# Patient Record
Sex: Male | Born: 1940 | Race: White | Hispanic: No | Marital: Married | Smoking: Former smoker
Health system: Southern US, Community
[De-identification: ages and names within clinical notes are randomized; demographics above are authoritative.]

## PROBLEM LIST (undated history)

## (undated) DIAGNOSIS — K219 Gastro-esophageal reflux disease without esophagitis: Secondary | ICD-10-CM

## (undated) DIAGNOSIS — Z8719 Personal history of other diseases of the digestive system: Secondary | ICD-10-CM

## (undated) DIAGNOSIS — I10 Essential (primary) hypertension: Secondary | ICD-10-CM

## (undated) DIAGNOSIS — T4145XA Adverse effect of unspecified anesthetic, initial encounter: Secondary | ICD-10-CM

## (undated) DIAGNOSIS — J301 Allergic rhinitis due to pollen: Secondary | ICD-10-CM

## (undated) DIAGNOSIS — N182 Chronic kidney disease, stage 2 (mild): Secondary | ICD-10-CM

## (undated) DIAGNOSIS — J302 Other seasonal allergic rhinitis: Secondary | ICD-10-CM

## (undated) DIAGNOSIS — E785 Hyperlipidemia, unspecified: Secondary | ICD-10-CM

## (undated) DIAGNOSIS — I4891 Unspecified atrial fibrillation: Secondary | ICD-10-CM

## (undated) DIAGNOSIS — T8859XA Other complications of anesthesia, initial encounter: Secondary | ICD-10-CM

## (undated) DIAGNOSIS — M545 Low back pain, unspecified: Secondary | ICD-10-CM

## (undated) DIAGNOSIS — Z9889 Other specified postprocedural states: Secondary | ICD-10-CM

## (undated) DIAGNOSIS — Z87442 Personal history of urinary calculi: Secondary | ICD-10-CM

## (undated) DIAGNOSIS — J90 Pleural effusion, not elsewhere classified: Secondary | ICD-10-CM

## (undated) DIAGNOSIS — E669 Obesity, unspecified: Secondary | ICD-10-CM

## (undated) DIAGNOSIS — G8929 Other chronic pain: Secondary | ICD-10-CM

## (undated) DIAGNOSIS — G4730 Sleep apnea, unspecified: Secondary | ICD-10-CM

## (undated) DIAGNOSIS — I255 Ischemic cardiomyopathy: Secondary | ICD-10-CM

## (undated) DIAGNOSIS — J42 Unspecified chronic bronchitis: Secondary | ICD-10-CM

## (undated) DIAGNOSIS — E1129 Type 2 diabetes mellitus with other diabetic kidney complication: Secondary | ICD-10-CM

## (undated) DIAGNOSIS — Z951 Presence of aortocoronary bypass graft: Secondary | ICD-10-CM

## (undated) DIAGNOSIS — M1990 Unspecified osteoarthritis, unspecified site: Secondary | ICD-10-CM

## (undated) DIAGNOSIS — Z8679 Personal history of other diseases of the circulatory system: Secondary | ICD-10-CM

## (undated) DIAGNOSIS — I219 Acute myocardial infarction, unspecified: Secondary | ICD-10-CM

## (undated) HISTORY — DX: Ischemic cardiomyopathy: I25.5

## (undated) HISTORY — DX: Obesity, unspecified: E66.9

## (undated) HISTORY — DX: Essential (primary) hypertension: I10

## (undated) HISTORY — DX: Pleural effusion, not elsewhere classified: J90

## (undated) HISTORY — PX: CYSTOSCOPY: SUR368

## (undated) HISTORY — PX: OTHER SURGICAL HISTORY: SHX169

## (undated) HISTORY — DX: Acute myocardial infarction, unspecified: I21.9

## (undated) HISTORY — DX: Hyperlipidemia, unspecified: E78.5

## (undated) HISTORY — DX: Unspecified atrial fibrillation: I48.91

## (undated) SURGERY — Surgical Case
Anesthesia: *Unknown

---
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
## 1998-12-01 HISTORY — PX: SINOSCOPY: SHX187

## 2001-06-10 ENCOUNTER — Encounter: Admission: RE | Admit: 2001-06-10 | Discharge: 2001-06-10 | Payer: Self-pay | Admitting: Internal Medicine

## 2001-06-10 ENCOUNTER — Encounter: Payer: Self-pay | Admitting: Internal Medicine

## 2001-11-22 ENCOUNTER — Encounter: Admission: RE | Admit: 2001-11-22 | Discharge: 2001-11-22 | Payer: Self-pay | Admitting: Internal Medicine

## 2001-11-22 ENCOUNTER — Encounter: Payer: Self-pay | Admitting: Internal Medicine

## 2002-06-09 ENCOUNTER — Encounter: Payer: Self-pay | Admitting: Internal Medicine

## 2002-06-09 ENCOUNTER — Ambulatory Visit (HOSPITAL_COMMUNITY): Admission: RE | Admit: 2002-06-09 | Discharge: 2002-06-09 | Payer: Self-pay | Admitting: Internal Medicine

## 2002-12-01 HISTORY — PX: NASAL SEPTUM SURGERY: SHX37

## 2004-07-29 ENCOUNTER — Encounter: Admission: RE | Admit: 2004-07-29 | Discharge: 2004-07-29 | Payer: Self-pay | Admitting: Internal Medicine

## 2005-03-27 ENCOUNTER — Ambulatory Visit: Payer: Self-pay | Admitting: Internal Medicine

## 2005-04-15 ENCOUNTER — Ambulatory Visit (HOSPITAL_BASED_OUTPATIENT_CLINIC_OR_DEPARTMENT_OTHER): Admission: RE | Admit: 2005-04-15 | Discharge: 2005-04-15 | Payer: Self-pay | Admitting: Internal Medicine

## 2005-04-21 ENCOUNTER — Ambulatory Visit: Payer: Self-pay | Admitting: Internal Medicine

## 2005-04-30 ENCOUNTER — Ambulatory Visit: Payer: Self-pay | Admitting: Internal Medicine

## 2005-11-17 ENCOUNTER — Encounter: Admission: RE | Admit: 2005-11-17 | Discharge: 2005-11-17 | Payer: Self-pay | Admitting: Internal Medicine

## 2006-07-03 ENCOUNTER — Ambulatory Visit: Payer: Self-pay | Admitting: Internal Medicine

## 2006-08-19 ENCOUNTER — Emergency Department (HOSPITAL_COMMUNITY): Admission: EM | Admit: 2006-08-19 | Discharge: 2006-08-20 | Payer: Self-pay | Admitting: Emergency Medicine

## 2006-12-01 DIAGNOSIS — I219 Acute myocardial infarction, unspecified: Secondary | ICD-10-CM

## 2006-12-01 HISTORY — DX: Acute myocardial infarction, unspecified: I21.9

## 2006-12-01 HISTORY — PX: CORONARY ANGIOPLASTY WITH STENT PLACEMENT: SHX49

## 2007-01-10 ENCOUNTER — Inpatient Hospital Stay (HOSPITAL_COMMUNITY): Admission: EM | Admit: 2007-01-10 | Discharge: 2007-01-12 | Payer: Self-pay

## 2007-01-10 ENCOUNTER — Ambulatory Visit: Payer: Self-pay | Admitting: Cardiology

## 2007-01-11 ENCOUNTER — Encounter: Payer: Self-pay | Admitting: Cardiology

## 2007-01-18 ENCOUNTER — Ambulatory Visit: Payer: Self-pay | Admitting: Cardiovascular Disease

## 2007-01-22 ENCOUNTER — Encounter (HOSPITAL_COMMUNITY): Admission: RE | Admit: 2007-01-22 | Discharge: 2007-04-22 | Payer: Self-pay | Admitting: Cardiovascular Disease

## 2007-02-01 ENCOUNTER — Ambulatory Visit: Payer: Self-pay | Admitting: Cardiovascular Disease

## 2007-04-05 ENCOUNTER — Ambulatory Visit: Payer: Self-pay | Admitting: Cardiovascular Disease

## 2007-04-05 LAB — CONVERTED CEMR LAB
ALT: 31 units/L (ref 0–40)
AST: 23 units/L (ref 0–37)
Albumin: 3.7 g/dL (ref 3.5–5.2)
Alkaline Phosphatase: 63 units/L (ref 39–117)
Bilirubin, Direct: 0.1 mg/dL (ref 0.0–0.3)
Cholesterol: 94 mg/dL (ref 0–200)
HDL: 30 mg/dL — ABNORMAL LOW (ref >39.0)
Hgb A1c MFr Bld: 5.7 % (ref 4.6–6.0)
LDL Cholesterol: 53 mg/dL (ref 0–99)
Total Bilirubin: 1 mg/dL (ref 0.3–1.2)
Total CHOL/HDL Ratio: 3.1
Total Protein: 6.7 g/dL (ref 6.0–8.3)
Triglycerides: 57 mg/dL (ref 0–149)
VLDL: 11 mg/dL (ref 0–40)

## 2007-04-23 ENCOUNTER — Encounter (HOSPITAL_COMMUNITY): Admission: RE | Admit: 2007-04-23 | Discharge: 2007-04-23 | Payer: Self-pay | Admitting: Cardiovascular Disease

## 2007-05-06 ENCOUNTER — Ambulatory Visit: Payer: Self-pay | Admitting: Cardiovascular Disease

## 2007-05-06 LAB — CONVERTED CEMR LAB
ALT: 40 units/L (ref 0–40)
AST: 30 units/L (ref 0–37)
Albumin: 4 g/dL (ref 3.5–5.2)
Alkaline Phosphatase: 67 units/L (ref 39–117)
BUN: 13 mg/dL (ref 6–23)
Bilirubin, Direct: 0.1 mg/dL (ref 0.0–0.3)
CO2: 25 meq/L (ref 19–32)
Calcium: 9.2 mg/dL (ref 8.4–10.5)
Chloride: 107 meq/L (ref 96–112)
Cholesterol: 99 mg/dL (ref 0–200)
Creatinine, Ser: 1.1 mg/dL (ref 0.4–1.5)
GFR calc Af Amer: 87 mL/min
GFR calc non Af Amer: 72 mL/min
Glucose, Bld: 99 mg/dL (ref 70–99)
HDL: 29.7 mg/dL — ABNORMAL LOW (ref >39.0)
LDL Cholesterol: 57 mg/dL (ref 0–99)
Potassium: 4.5 meq/L (ref 3.5–5.1)
Sodium: 137 meq/L (ref 135–145)
Total Bilirubin: 1.3 mg/dL — ABNORMAL HIGH (ref 0.3–1.2)
Total CHOL/HDL Ratio: 3.3
Total Protein: 6.9 g/dL (ref 6.0–8.3)
Triglycerides: 60 mg/dL (ref 0–149)
VLDL: 12 mg/dL (ref 0–40)

## 2007-05-27 ENCOUNTER — Ambulatory Visit: Payer: Self-pay

## 2008-01-20 ENCOUNTER — Ambulatory Visit: Payer: Self-pay | Admitting: Cardiovascular Disease

## 2008-01-20 LAB — CONVERTED CEMR LAB
ALT: 43 units/L (ref 0–53)
AST: 32 units/L (ref 0–37)
Albumin: 4 g/dL (ref 3.5–5.2)
Alkaline Phosphatase: 66 units/L (ref 39–117)
BUN: 15 mg/dL (ref 6–23)
Bilirubin, Direct: 0.2 mg/dL (ref 0.0–0.3)
CO2: 24 meq/L (ref 19–32)
Calcium: 9.3 mg/dL (ref 8.4–10.5)
Chloride: 104 meq/L (ref 96–112)
Cholesterol: 94 mg/dL (ref 0–200)
Creatinine, Ser: 1.3 mg/dL (ref 0.4–1.5)
GFR calc Af Amer: 71 mL/min
GFR calc non Af Amer: 59 mL/min
Glucose, Bld: 107 mg/dL — ABNORMAL HIGH (ref 70–99)
HDL: 29.8 mg/dL — ABNORMAL LOW (ref >39.0)
LDL Cholesterol: 56 mg/dL (ref 0–99)
Potassium: 4.4 meq/L (ref 3.5–5.1)
Sodium: 136 meq/L (ref 135–145)
Total Bilirubin: 1.3 mg/dL — ABNORMAL HIGH (ref 0.3–1.2)
Total CHOL/HDL Ratio: 3.2
Total Protein: 6.9 g/dL (ref 6.0–8.3)
Triglycerides: 40 mg/dL (ref 0–149)
VLDL: 8 mg/dL (ref 0–40)

## 2008-02-16 ENCOUNTER — Encounter: Admission: RE | Admit: 2008-02-16 | Discharge: 2008-02-16 | Payer: Self-pay | Admitting: Internal Medicine

## 2009-05-16 ENCOUNTER — Encounter: Admission: RE | Admit: 2009-05-16 | Discharge: 2009-05-16 | Payer: Self-pay | Admitting: Sports Medicine

## 2009-12-14 ENCOUNTER — Encounter: Admission: RE | Admit: 2009-12-14 | Discharge: 2009-12-14 | Payer: Self-pay | Admitting: General Surgery

## 2009-12-18 ENCOUNTER — Ambulatory Visit (HOSPITAL_BASED_OUTPATIENT_CLINIC_OR_DEPARTMENT_OTHER): Admission: RE | Admit: 2009-12-18 | Discharge: 2009-12-18 | Payer: Self-pay | Admitting: General Surgery

## 2009-12-18 HISTORY — PX: HERNIA REPAIR: SHX51

## 2009-12-20 ENCOUNTER — Emergency Department (HOSPITAL_BASED_OUTPATIENT_CLINIC_OR_DEPARTMENT_OTHER): Admission: EM | Admit: 2009-12-20 | Discharge: 2009-12-20 | Payer: Self-pay | Admitting: Emergency Medicine

## 2010-01-01 HISTORY — PX: UMBILICAL HERNIA REPAIR: SHX196

## 2011-02-16 LAB — COMPREHENSIVE METABOLIC PANEL
ALT: 48 U/L (ref 0–53)
AST: 35 U/L (ref 0–37)
Albumin: 4.1 g/dL (ref 3.5–5.2)
Alkaline Phosphatase: 50 U/L (ref 39–117)
BUN: 15 mg/dL (ref 6–23)
CO2: 25 mEq/L (ref 19–32)
Calcium: 9.2 mg/dL (ref 8.4–10.5)
Chloride: 105 mEq/L (ref 96–112)
Creatinine, Ser: 1.19 mg/dL (ref 0.4–1.5)
GFR calc Af Amer: >60 mL/min (ref >60)
GFR calc non Af Amer: >60 mL/min (ref >60)
Glucose, Bld: 163 mg/dL — ABNORMAL HIGH (ref 70–99)
Potassium: 4.7 mEq/L (ref 3.5–5.1)
Sodium: 138 mEq/L (ref 135–145)
Total Bilirubin: 1.2 mg/dL (ref 0.3–1.2)
Total Protein: 6.8 g/dL (ref 6.0–8.3)

## 2011-02-16 LAB — DIFFERENTIAL
Basophils Absolute: 0 10*3/uL (ref 0.0–0.1)
Basophils Relative: 0 % (ref 0–1)
Eosinophils Absolute: 0.4 10*3/uL (ref 0.0–0.7)
Eosinophils Relative: 6 % — ABNORMAL HIGH (ref 0–5)
Lymphocytes Relative: 22 % (ref 12–46)
Lymphs Abs: 1.4 10*3/uL (ref 0.7–4.0)
Monocytes Absolute: 0.7 10*3/uL (ref 0.1–1.0)
Monocytes Relative: 11 % (ref 3–12)
Neutro Abs: 3.9 10*3/uL (ref 1.7–7.7)
Neutrophils Relative %: 61 % (ref 43–77)

## 2011-02-16 LAB — CBC
HCT: 52.7 % — ABNORMAL HIGH (ref 39.0–52.0)
Hemoglobin: 17.8 g/dL — ABNORMAL HIGH (ref 13.0–17.0)
MCHC: 33.8 g/dL (ref 30.0–36.0)
MCV: 94.4 fL (ref 78.0–100.0)
Platelets: 163 10*3/uL (ref 150–400)
RBC: 5.58 MIL/uL (ref 4.22–5.81)
RDW: 14 % (ref 11.5–15.5)
WBC: 6.4 10*3/uL (ref 4.0–10.5)

## 2011-02-17 LAB — GLUCOSE, CAPILLARY: Glucose-Capillary: 122 mg/dL — ABNORMAL HIGH (ref 70–99)

## 2011-04-15 NOTE — Assessment & Plan Note (Signed)
[facility]                            CARDIOLOGY OFFICE NOTE   NAME:[name], [name]                    MRN:          [id number]  DATE:05/06/2007                            DOB:          [birth year]    [name] was seen at the [facility] as an  outpatient on May 06, 2007. He is a 70 year old gentleman who had a  inferoposterior myocardial infarction in February of this year. He had  an occluded right coronary artery and was treated with a bare metal  stent to that vessel. He is continuing to participate in cardiac rehab  and has really enjoyed that experience. He plans on volunteering there  when he completes his program. He is exercising regularly and also doing  fairly hard physical work. He does complain of some generalized fatigue  that he relates to his long acting metoprolol. He worries about his  heart rate which has been in the range of 50 and sometimes a little bit  lower. Otherwise he feels well. He denies any chest pain, dyspnea,  orthopnea, PND, edema, claudication symptoms, or syncope. He has had no  palpitations.   CURRENT MEDICATIONS:  1. Vitamin C.  2. Bee pollen.  3. Zyrtec.  4. Coenzyme Q10.  5. Aspirin 81 mg daily.  6. Lipitor 80 mg daily.  7. Plavix 75 mg daily.  8. Lisinopril 10 mg daily.  9. Metoprolol ER 25 mg daily.   ALLERGIES:  No known drug allergies.   PHYSICAL EXAMINATION:  He is alert and oriented, in no acute distress.  His weight is 235 pounds. This is down from 248 pounds in early March.  His blood pressure is 105/78, heart rate 50, respiratory rate 16.  HEENT: Normal.  NECK: Normal carotid upstrokes without bruits. Jugular venous pressure  is normal.  LUNGS: Clear to auscultation bilaterally.  HEART: Regular rate and rhythm without murmurs or gallops.  ABDOMEN: Soft, nontender. No organomegaly. No abdominal bruits.  EXTREMITIES: No clubbing, cyanosis, or edema. Peripheral pulses are  2 +  and equal throughout.   EKG: Demonstrates sinus bradycardia with an inferoposterior infarct  pattern. There are no significant ST segment or T wave changes.   ASSESSMENT:  [name] is currently stable from a cardiovascular  standpoint. Overall he is doing very well. His current cardiac problems  are as follows;  1. Coronary artery disease. Status post inferior myocardial infarction      and bare metal stenting to the right coronary artery. At the time      of his catheterization he had moderate LAD disease with a 60%      lesion in the mid LAD. He is asymptomatic. I would like to      performed an exercise Myoview study to rule out any significant      ischemia in that region. I will likely favor medical therapy unless      there is a large area of ischemia. He complained of easy      bruisability today and we discussed the fact that this was a result  of his antiplatelet therapy with aspirin and clopidogrel. I advised      that he would benefit from 12 months of dual antiplatelet therapy      and he is agreeable to continuing with this.  2. Bradycardia. He does appear to be symptomatic from his bradycardia      and I have asked him to reduce his metoprolol dose from 25 mg to      12.5 mg daily. We will see if this improves his symptoms. If he      continues to be bradycardic we will plan on discontinuing the beta      blocker all together.  3. Hypertension. He has optimal control at present. Beta blocker dose      change as above. I would like him to continue on lisinopril 10 mg      daily.  4. Dyslipidemia. He has concerns about high dose statin therapy. He is      fasting today so we will check a lipid panel as well as LFTs.   For follow up, I would like to see [name] back in 6 months or  sooner if any new problems arise. I will be in contact with him by  telephone for the results after the results of his lipid, LFTs, and  Myoview are back.     Veverly Fells.  Excell Seltzer, MD  Electronically Signed    MDC/MedQ  DD: 05/06/2007  DT: 05/06/2007  Job #: [id number]   cc:   Antony Madura, M.D.

## 2011-04-15 NOTE — Assessment & Plan Note (Signed)
[facility]                            CARDIOLOGY OFFICE NOTE   NAME:[name], [name]                    MRN:          [id number]  DATE:01/20/2008                            DOB:          1941/01/06    [name] was seen at the [facility] Office on January 20, 2008.  [name] is a delightful 70 year old gentleman with  coronary artery disease.  He had an inferior MI 1 year ago and was also  found to have moderate LAD stenosis.  He has responded exceptionally  well to medical therapy.  He continues to do physical work but has not  been engaged in much regular exercise.  He really has no symptoms at  this time other than feeling generalized fatigue.  He denies chest pain,  dyspnea, orthopnea, PND, edema, palpitations, lightheadedness or  syncope.  He really would like to come off of his cardiac medications as  he thinks his energy level would improve, and he has been concerned for  quite some time about a slow heart rate.   MEDICATIONS:  Include vitamin C daily, Zyrtec 2 daily, Lipitor 80 mg  daily, Plavix 75 mg daily, lisinopril 10 mg daily, metoprolol 12.5 mg  daily, glucosamine 2 daily.   ALLERGIES:  NKDA.   EXAM:  The patient is alert and oriented.  He is in no acute distress.  Weight is 240, blood pressure 104/70, heart rate 64, respiratory rate  16.  HEENT:  Normal.  NECK:  Normal carotid upstrokes without bruits.  Jugular venous pressure  normal.  LUNGS:  Clear to auscultation bilaterally.  HEART:  Regular rate and rhythm without murmurs or gallops.  ABDOMEN:  Soft, obese, nontender no organomegaly.  No bruits.  EXTREMITIES:  No clubbing, cyanosis or edema.  Peripheral pulses 2+ and  equal throughout.   EKG shows normal sinus rhythm with an age-indeterminate inferior MI.  There are no significant ST-segment or T-wave changes.   ASSESSMENT:  1. Coronary artery disease status post inferior myocardial infarction.   [name] is now 1 year out from his infarct.  I have asked him      to stop Plavix and resume aspirin 81 mg daily.  He had discontinued      aspirin on his own due to bleeding tendencies when he was taking      both medications together.  Now that he is 1 year out he can stop      his Plavix and can resume a baby aspirin.  In addition I am going      to take him off of metoprolol.  He is on a homeopathic dose and I      think he will do fine without it if he strongly desires to      discontinue this medication.  I have asked him to continue his      lisinopril and Lipitor without change.  2. Dyslipidemia.  Remains on high-dose Lipitor.  Lipids from June 2008      showed cholesterol of 99, triglycerides 60, HDL 30, LDL 57.  LFTs  were normal.  Will repeat lipids and LFTs today.  3. Follow-up I would like to see [name] back in 1 year.  If he      has any problems, I would be happy to see him in the interim.     Veverly Fells. Excell Seltzer, MD  Electronically Signed    MDC/MedQ  DD: 01/20/2008  DT: 01/20/2008  Job #: [id number]   cc:   Antony Madura, M.D.

## 2011-04-18 NOTE — Assessment & Plan Note (Signed)
[facility]                            CARDIOLOGY OFFICE NOTE   NAME:[name], [name]                    MRN:          [id number]  DATE:02/01/2007                            DOB:          03/25/1941    [name] returns to the [facility] outpatient cardiology clinic  after a recent hospitalization from February 10-12, 2008.  [name]  suffered an acute inferoposterior myocardial infarction and he was  treated with primary PCI with a good result.  His LV function was well  preserved with a post MI echo showing normal LV systolic function and  his left ventriculogram at the time of his infarct estimated an EF of  55%.  He was treated with a bare metal stent in a large right coronary  artery.  He had an uncomplicated hospital course and was discharged home  2 days after his presentation.   Since discharge home [name] has felt well.  He has no complaints.  He specifically denies chest pain, dyspnea, lightheadedness, syncope,  orthopnea, PND, or edema.  He feels like we are limiting his activity  too much as he feels very good and would like to be allowed to be more  physically active.   CURRENT MEDICATIONS:  1. Glucosamine chondroitin.  2. Vitamin C.  3. Bee Pollen.  4. Zyrtec which is on hold.  5. Co-enzyme Q10.  6. Cetirizine.  7. Aspirin 81 mg daily.  8. Lipitor 80 mg daily,  9. Plavix 75 mg daily.  10.Lisinopril 10 mg daily.  11.Metoprolol ER 25 mg daily.   ALLERGIES:  NKDA.   EXAMINATION:  The patient is alert and oriented, he is in no acute  distress.  His weight is 248 pounds, blood pressure is 112/68, heart  rate is 58, respiratory rate is 16.  HEENT:  Normal.  NECK:  Normal carotid upstrokes without bruits, jugular venous pressure  is normal.  LUNGS:  Clear to auscultation bilaterally.  HEART:  Regular rate and rhythm without murmurs, or gallops. The apex is  not palpable.  ABDOMEN:  Soft, nontender, obese. No  organomegaly, no abdominal bruits.  EXTREMITIES:  No clubbing, cyanosis, or edema.  Peripheral pulses are 2+  and equal throughout.   EKG shows sinus bradycardia with an inferoposterior infarct, agent  indeterminate.  There are no acute ST segment or T wave changes.  Labs  reviewed from the hospital demonstrate a creatinine of 1.1.  Total  cholesterol of 114, HDL 35, LDL 53.  Hemoglobin A1c of 6.3.  Normal TSH  at 1.3.   ASSESSMENT:  [name] is currently stable from a cardiovascular  standpoint.  His cardiac problems are as follows:  1. Recent inferoposterior myocardial infarction, status post      percutaneous coronary intervention of the right coronary artery      with a bare metal stent.  [name] is doing well regarding his      recovery from his myocardial infarction.  He is completely      asymptomatic and has had no rhythm problems or other issues in his  recovery.  Recommend continuation of aspirin and clopidogrel as      well as his current medical regimen.  He can really return to his      baseline activity level, and I have encouraged exercise.  I have      written for his goal heart rate to be increased at cardiac      rehabilitation.  I told him it was okay for him to start doing some      core strengthening exercises that he likes to do for maintenance of      his back problems.  He has residual moderate disease in his left      anterior descending, but unless he develops symptoms I will not      plan on performing any functional studies or other tests unless he      develops problems.  2. Paroxysmal atrial fibrillation.  [name] was noted to be in      atrial fibrillation at the time of presentation with his acute      myocardial infarction.  He converted spontaneously to sinus rhythm      in the hospital.  He has no history of atrial fibrillation.  I      performed a 24 Holter monitor that showed no episodes of atrial      fibrillation.  At this  point we will continue him on antiplatelet      therapy as well as beta blockade.  3. Dyslipidemia.  He had previous good control on 20 mg of Lipitor.      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      Laboratory work from July 21, 2006 showed a total cholesterol of      133 with an LDL of 70 and an HDL of 45.  Despite that he had an      acute coronary event and we placed him on high dose statin therapy      with Lipitor 80 mg.  I would like to recheck his cholesterol and      liver function tests in 2 months since he has had an increase in      his medication.  4. Hypertension.  He is well controlled on a combination of lisinopril      and metoprolol.  5. Obstructive sleep apnea.  He has been unable to tolerate his      continuous positive airway pressure because his Zyrtec-D has been      held due to concerns over the effects on the heart.  He says he has      been a little more congested and is unable to tolerate the      continuous positive airway pressure.  I advised that he can resume      his Zyrtec-D.  I told him that it is not ideal to be on this      medication but I think it is more important that he is able to wear      his continuous positive airway pressure, as untreated obstructive      sleep apnea can certainly cause more problems from a cardiovascular      standpoint.  6. Hyperglycemia.  He has not had elevated glucoses on an outpatient      basis but his hemoglobin A1c when checked in the hospital was 6.3.  He did have elevated glucoses during his hospitalization.  Will      plan on rechecking a hemoglobin A1c when he has his blood work      checked in 2 months.  7. For followup I would like to see [name] back in      approximately 3 months and then we will plan on seeing him on an      every 6 months basis thereafter.    Veverly Fells. Excell Seltzer, MD  Electronically  Signed    MDC/MedQ  DD: 02/01/2007  DT: 02/01/2007  Job #: [id number]   cc:   Antony Madura, M.D.  Dalbert Mayotte, M.D.

## 2011-04-18 NOTE — Cardiovascular Report (Signed)
[name], ALVIDREZ NO.:  [id number]   MEDICAL RECORD NO.:  [id number]          PATIENT TYPE:  INP   LOCATION:  2902                         FACILITY:  [facility]   PHYSICIAN:  Veverly Fells. Excell Seltzer, MD  DATE OF BIRTH:  May 10, 1941   DATE OF PROCEDURE:  01/10/2007  DATE OF DISCHARGE:                            CARDIAC CATHETERIZATION   PROCEDURE:  1. Left heart catheterization.  2. Selective coronary angiography.  3. Left ventricular angiography.  4. Aspiration thrombectomy.  5. Percutaneous transluminal cardiac angioplasty and stenting of the      right coronary artery.  6. Angio-Seal of the right femoral artery.   INDICATIONS:  Mr. Seith is a 70 year old gentleman who presented  with the acute onset of substernal chest pain.  His EKG demonstrated  acute inferoposterior injury current.  He has no past cardiac history  but does take Lipitor for hyperlipidemia.  In the setting of his acute  MI, he was brought emergently for cardiac catheterization.   PROCEDURAL DETAILS:  Risks and indications of the procedure were  reviewed with the patient.  Informed consent was obtained.  However, the  patient was sedated at the onset of the procedure as he had already  received 5 mg of Versed by EMS.  The right groin was prepped, draped and  anesthetized with 1% lidocaine.  Using a front wall arterial puncture, a  6-French sheath was placed in the right femoral artery.  Diagnostic  views of the left coronary artery were taken with a JL-4 catheter.  A JR-  4 catheter was then inserted into the right coronary artery and  demonstrated an occluded vessel.  Angiomax was used for anticoagulation.  The patient was given 300 mg of clopidogrel.  The right coronary artery  was wired with a Cougar coronary guidewire.  Aspiration thrombectomy was  performed with a Fetch catheter.  This restored flow in the vessel.  An  ACT was checked which came back at 200.  A repeat ACT was checked  and  came back at 190.  We checked the IV site that the Angiomax was running  in, and it appeared widely patent.  There were no signs of infiltration.  We looked at the Angiomax bag, and it appeared the medication had not  been activated.  At that point, we converted to heparin and Integrilin.  Three thousand units of heparin was given.  Integrilin double bolus and  drip was started.  Balloon angioplasty was performed with a 2.5 x 15 mm  Maverick balloon to 10 atmospheres.  Repeat ACT was checked, and it was  greater than 300.  At that point, a 4-0 x 16 mm Liberte stent was placed  over the lesion and was deployed at 16 atmospheres.  The stent was then  postdilated with a 4.5 x 12 mm Quantum Maverick balloon up to 20  atmospheres.  At the conclusion of the intervention, the stent was well  expanded, and there was TIMI-3 flow throughout the vessel.   Following the intervention, an angled pigtail catheter was inserted into  the left ventricle, and pressures were recorded.  A left ventriculogram  was done.  A pullback across the aortic valve was performed.  At the  conclusion of the case, and Angio-Seal device was used to seal the right  femoral arteriotomy.   FINDINGS:  Aortic pressure 91/57 with a mean of 71.  LV pressure 98/11  with an end-diastolic pressure of 16.   CORONARY ANGIOGRAPHY:  The left mainstem has minor luminal  irregularities.  It bifurcates into the LAD and left circumflex.   The LAD is a large-caliber vessel that courses down to the left  ventricular apex.  It has some minor luminal irregularities throughout  the proximal portion of the vessel.  There are three diagonal branches.  All are small and medium-caliber vessels.  At the ostium of the third  diagonal branch in the mid LAD, there is a 60% mid LAD stenosis.  The  remaining portions of the mid and distal LAD have minor luminal  irregularities but are free of any significant angiographic disease.   The left  circumflex is a medium-caliber vessel throughout its proximal  portion.  It gives off a first OM branch that is free of any significant  angiographic disease.  After the first OM branch, the circumflex is  small.  It courses down the AV groove.  It gives off a second OM branch  that is also a small vessel and supplies a large atrial branch as well.   The right coronary artery is occluded in its proximal portion.   Left ventriculogram performed at the conclusion of the procedure  demonstrated a hypokinetic inferior wall and normal LV function in other  segments.  The overall left ventricular ejection fraction is estimated  at 55%.  There is no mitral regurgitation visualized.   CONCLUSION:  1. Inferoposterior myocardial infarction secondary to acute right      coronary artery occlusion.  2. Moderate left anterior descending disease.  3. Nonobstructive left circumflex disease.  4. Mild left ventricular dysfunction in the setting of the an acute      inferior myocardial infarction.   PLAN:  As described above, PCI of the right coronary artery was  performed with a 4.0 x 16 Liberte stent.  There was an excellent  angiographic result with good stent expansion and TIMI-3 flow at the  conclusion of the case.  Integrilin will be continued for 12 hours.  The  patient received an additional 300 mg of Plavix at the conclusion of the  procedure for a 600 mg total loading dose.  He will be continued on  aspirin and Plavix.  As well, his Lipitor dose will be increased, and  metoprolol 25 mg twice daily will be started.  Of note, the patient was  in atrial fibrillation from the onset of the procedure.  Will need to  monitor his rhythm, and, if he remains in atrial fibrillation, he will  require Coumadin.      Veverly Fells. Excell Seltzer, MD  Electronically Signed     MDC/MEDQ  D:  01/10/2007  T:  01/10/2007  Job:  [id number]

## 2011-04-18 NOTE — Assessment & Plan Note (Signed)
[place] [facility]                               PULMONARY OFFICE NOTE   NAME:[name], [name]                    MRN:          [id number]  DATE:07/03/2006                            DOB:          Apr 01, 1941    PROBLEM LIST:  1.  Obstructive sleep apnea.  2.  Allergic rhinitis.  3.  Asthma.  4.  Fibromyalgia.  5.  Degenerative disk disease.  6.  Diabetes, type 2.   HISTORY OF PRESENT ILLNESS:  I last saw this gentleman in May, a year ago,  when we were setting up home C-PAP at 13 CWP for his sleep apnea. He says he  never got a C-PAP machine. Apparently he got into some sort of disagreement  with [employer] and it never got delivered. We talked again about  sleep apnea. He does want to try C-PAP. I notice that he went a year without  making any effort to contact us about his home care problems. He describes  himself still as very busy, running 2 businesses with considerable driving  and I talked to him again about his responsibility to be safe and alert  while driving. We talked about his nasal congestion, which he says gets  rapidly worse at the onset of fall pollen season each year. He had had  positive allergy testing in high school and remembers being told that he was  very broadly positive. He may have been on allergy vaccine at that time. He  says he has had little bronchitis or asthma in the last 3 years and we note  that he no longer smokes.   MEDICATIONS:  Glucosamine, bee pollen, Zyrtec, Tylenol sinus.   ALLERGIES:  NO KNOWN DRUG ALLERGIES.   OBJECTIVE:  VITAL SIGNS:  Weight 245 pounds. Blood pressure 112/62, pulse  regular at 58. Room air saturation 92%.  GENERAL:  This is an obese, thick necked gentleman, quite alert. Breathing  is unlabored. He does some throat clearing with pharyngeal space in 3/4. No  stridor or thyromegaly.  HEENT:  His nasal airway is un-obstructive and he can breath through his  nose with his mouth  closed.   IMPRESSION:  Obstructive sleep apnea and rhinitis, both deserve more effort  and reviewed choices.   PLAN:  1.  We are going to re-try setting up home C-PAP at 13 CWP. He and the care      manager will choose a home care company.  2.  Sample Astelin nasal spray for use once each nostril at bedtime for      trial.  3.  Schedule return in 1 month, earlier p.r.n.                                   Clinton D. Maple Hudson, MD, [facility], FACP   CDY/MedQ  DD:  07/03/2006  DT:  07/04/2006  Job #:  [id number]   cc:   Antony Madura, MD

## 2011-04-18 NOTE — Procedures (Signed)
NAME:  [name], [name]             ACCOUNT NO.:  [id number]   MEDICAL RECORD NO.:  [id number]          PATIENT TYPE:  OUT   LOCATION:  SLEEP CENTER                 FACILITY[facility]  [facility]   PHYSICIAN:  Clinton D. Maple Hudson, M.D. DATE OF BIRTH:  24-May-1941   DATE OF STUDY:  04/15/2005                              NOCTURNAL POLYSOMNOGRAM   REFERRING PHYSICIAN:  Dr. Jetty Duhamel   DATE OF STUDY:  Apr 15, 2005   INDICATION FOR STUDY:  Hypersomnia with sleep apnea.  Epworth Sleepiness  Score 14/24, BMI 35, weight 245 pounds.   SLEEP ARCHITECTURE:  Total sleep time 344 minutes with sleep efficiency 87%.  Stage I was 7%, stage II 67%, stages III and IV 11%, REM 15% of total sleep  time.  Sleep latency was 5 minutes, REM latency 125 minutes, awake after  sleep onset 45 minutes, arousal index 33.  No sleep medications reported.   RESPIRATORY DATA:  Respiratory disturbance index (RDI, AHI) 15.6 per hour  indicating moderate obstructive sleep apnea/hypopnea syndrome before CPAP.  There were 8 obstructive apneas and 30 hypopneas before CPAP.  Events were  not positional.  REM RDI 26.  CPAP was titrated to a recommended initial  pressure of 13 CWP (RDI 5.6 per hour).  A large Respironics Comfort Series 2  Full Face Mask was used with heated humidifier.   OXYGEN DATA:  Moderate to loud snoring with oxygen desaturation to a nadir  of 65% before CPAP.  After CPAP control oxygen saturation held 95-98% on  room air.   CARDIAC DATA:  Normal sinus rhythm with occasional PVC.   MOVEMENT/PARASOMNIA:  A total of 244 limb jerks were recorded of which 23  were associated with arousal or awakening for a periodic limb movement with  arousal index of 5.7 per hour which is increased.   IMPRESSION/RECOMMENDATION:  1.  Moderate obstructive sleep apnea/hypopnea syndrome, respiratory      disturbance index 15.6 per hour with moderately loud snoring and oxygen      desaturation to 65%.  2.  Successful continuous  positive airway pressure titration to an initial      recommended pressure trial at 13 CWP (respiratory disturbance index 5.6      per hour).  A large Respironics Comfort Series 2 Full Face Mask was used      with heated humidifier.  3.  Periodic limb movement with arousal, 5.7 per hour.      CDY/MEDQ  D:  04/20/2005 11:03:06  T:  04/20/2005 12:47:28  Job:  [id number]

## 2011-04-18 NOTE — Discharge Summary (Signed)
[name], [name] NO.:  [id number]   MEDICAL RECORD NO.:  [id number]          PATIENT TYPE:  INP   LOCATION:  2921                         FACILITY:  [facility]   PHYSICIAN:  Veverly Fells. Excell Seltzer, MD  DATE OF BIRTH:  [birth year]   DATE OF ADMISSION:  01/10/2007  DATE OF DISCHARGE:  01/12/2007                               DISCHARGE SUMMARY   PRIMARY CARDIOLOGIST:  Dr. [name].   PRIMARY CARE PHYSICIAN:  Dr. Burton Apley and Dr. Rosalio Loud.   PROCEDURES PREFORMED DURING HOSPITALIZATION:  1. Cardiac catheterization dated January 10, 2007 and performed by      Dr. [name].  A:  Left main stream has minor luminal irregularities, it bifurcates  into the LAD and left circumflex.  The LAD is a large caliber vessel  that courses down to the left ventricular apex.  It has some minor  luminal irregularities throughout the proximal portion of the vessel.  There are three diagonal branches, all are small medium caliber vessels.  At the ostium of the third diagonal branch in the mid LAD there is a 60%  and mid LAD stenosis.  The remaining portions of the mid and distal LAD  have minor luminal irregularities, but are free of any significant  angiographic disease.  The left circumflex is a medium caliber vessel  throughout the proximal portion giving rise to a first OM branch that is  free of any significant angiographic disease.  After the first OM branch  the circumflex is small.  It courses down the AV groove.  It gives off a  second OM branch that is also small vessel and supplies the atrial  branch as well.  The right coronary artery is occluded in the proximal  portion.  Left ventriculogram performed at the conclusion of the  procedure demonstrated hypokinetic inferior wall and normal LV function  in other segments.  The overall left ventricular ejection fraction is  estimated at 55%.  There is no mitral regurg.  1. Status post PCI of the right coronary  artery using a 4.1 x 16      Liberty stent with excellent angiographic result with good stent      expansion and TIMI 3 flow at the conclusion of the case.   HISTORY OF PRESENT ILLNESS:  This is a 70 year old obese Caucasian male  with a history of dyslipidemia who was brought to the emergency room by  EMS secondary to complaints of chest pain lasting 45 minutes.  The  patient states that the pain awoke him around 12:30 a.m. and it was  associated with shortness of breath and lightheadedness and near  syncope.  He described it as a 10/10 and the worse pain he had ever  felt.  EMS was called, he was found to be bradycardiac with ST elevation  inferiorly.  The patient was given transcutaneous pacing after he was  started on Versed.  The patient improved with a heart rate in the 60s.  His blood pressure was stabilized and he was brought to the emergency  room.  A code STEMI was then called  on arrival to the emergency room  after evaluation showing inferior ST elevation and reciprocal changes in  the anterior leads.  The patient was seen by Dr. Pattricia Boss,  Cardiology Fellow.   HOSPITAL COURSE:  The patient was emergently seen in the cardiac  catheterization laboratory by Dr. [name] on the early morning  hours of January 10, 2007.  Above mentioned cardiac catheterization was  completed with stent to the right coronary artery.  The patient was  continued on Integrilin during the following 48 hours.  It was noted  that the patient had transient atrial fibrillation postprocedure, which  lasted approximately 48 hours.  The atrial fibrillation did convert  spontaneously on its own in the early morning hours of November 12, 2007.  Coumadin was instituted on November 11, 2007 secondary to the  atrial fibrillation, but this was discontinued the following morning.   The patient did have an echocardiogram completed postprocedure,which  revealed overall left ventricular function  normal, LV size normal, LA  size normal, mild RV hypertrophy was noted.  The patient was seen by  cardiac rehab throughout hospitalization and did well and was very  motivated to continue cardiac rehab post discharge.  He had no further  complaints of shortness of breath, chest pain or discomfort in his  chest.  There was no recurrence of atrial fibrillation after conversion.   DISCHARGE DIAGNOSES:  1. Status post acute inferior ST elevated myocardial infarction (MI).  2. Status post DES Liberty stent to the right coronary artery with      moderate LAD disease.   SECONDARY DIAGNOSES:  1. Obstructive sleep apnea.  Does not tolerate CPAP.  2. Hyperlipidemia.  3. Paroxysmal atrial fibrillation.  Now in normal sinus rhythm.  4. Obesity.   LABS ON DISCHARGE:  Sodium 136, potassium 4.5, chloride 104, CO2 25, BUN  15, creatinine 1.1, glucose 126, calcium 8.8.  Hemoglobin 13.4,  hematocrit 39.1, white blood cells 10.8, platelets 173.  Hemoglobin A1c  6.3.  Cholesterol 114, lipids 128, HDL 35, LDL 53.  TSH 1.313.  Troponin  was found to be greater than 100 on day of admission.  Chest x-ray  January 10, 2007 revealed borderline cardiac enlargement, minimal  bronchitic changes.  Echocardiogram dated January 11, 2007 overall left  ventricular systolic function normal.  There was mild fiber calcific  change of the aortic root.  There was mild right ventricular hypertrophy  noted.  Left atrium and left atrial size was normal.   VITAL SIGNS ON DISCHARGE:  Blood pressure 112/49, heart rate 61,  respirations 22, O2 sat 94% on room air, temperature 96.7.   FOLLOW UP PLANS AND APPOINTMENTS:  1. The patient will be followed by Dr. [name] in March at      9:15.  2. He will follow up with his primary care physician.  The patient is      to make a phone call to have followup appointment scheduled. 3. The patient will be scheduled for a Holter monitor 48 hour to      evaluate for recurrence  of atrial fibrillation.  The patient has      been aware that our office will call.  4. The patient is to continue cardiac rehab as an outpatient.  5. The patient was given post cardiac catheterization instructions      with particularly emphasis to the right groin site for evidence of      hematoma, swelling, infection or pain.   DISCHARGE  MEDICATIONS:  1. Plavix 75 mg once a day.  2. Lipitor 80 mg at bedtime.  3. Lisinopril 10 mg daily.  4. Aspirin enteric-coated 81 mg daily.  5. Toprol XL 25 mg daily.  6. Nitroglycerin 0.4 mg as needed p.r.n. chest pain.   ALLERGIES:  NO KNOWN DRUG ALLERGIES.   Time spent with the patient to include physician time 40 minutes.      Bettey Mare. Lyman Bishop, NP      Veverly Fells. Excell Seltzer, MD  Electronically Signed    KML/MEDQ  D:  01/12/2007  T:  01/13/2007  Job:  [id number]   cc:   Antony Madura, M.D.  Rosalio Loud, MD

## 2011-04-18 NOTE — H&P (Signed)
NAMERILYN, UPSHAW NO.:  [id number]   MEDICAL RECORD NO.:  [id number]          PATIENT TYPE:  INP   LOCATION:  2902                         FACILITY:  [facility]   PHYSICIAN:  Lorain Childes, MD DATE OF BIRTH:  [birth year]   DATE OF ADMISSION:  01/10/2007  DATE OF DISCHARGE:                              HISTORY & PHYSICAL   PRIMARY CARE PHYSICIAN:  Dr. Marla Roe.   CHIEF COMPLAINT:  Chest pain.   HISTORY OF PRESENT ILLNESS:  Patient is a 70 year old gentleman with  history of dyslipidemia who was brought to the emergency room by EMS  with chest pain for the past 45 minutes.  His pain began around 12:30  a.m.  He reports shortness of breath and lightheadedness with near-  syncope, associated pain, also diaphoresis.  His pain was originally a  10/10.  EMS was called.  He was found to be bradycardic and had ST  elevation.  Transcutaneous pacing was applied and began after he was  given Versed.  Patient improved his heart rate to the 60s.  His blood  pressure remained stable.  He was brought to the emergency room.  In the  ER, repeat EKG was obtained, which revealed marked inferior ST elevation  with reciprocal changes in the anterior leads.  A code STEMI was then  called.  Patient now reports 3/10 chest discomfort.   PAST MEDICAL HISTORY:  1. Dyslipidemia.  2. Obesity.  3. Patient had a stress test done in January of this year by PCP,      which he gets done routinely every couple of years and it was      normal.   MEDICATIONS:  1. Lipitor unknown dose.  2. Zyrtec 1 tablet p.o. daily p.r.n.  3. He also takes co-enzyme q. daily and vitamin C daily.   He has no known drug allergies.   SOCIAL HISTORY:  He lives in [place] with his wife.  He denies any  tobacco or alcohol use.  No drugs.  He uses Co-enzyme Q and vitamin C  but no other herbal medications.  His mother had CAD in her 82s and is  the only one in his family with a history of coronary  disease.   REVIEW OF SYSTEMS:  He denies any fevers, chills.  No cough or sputum,  no headache, no visual changes.  He reports chest pain as described in  the HPI.  He denies any dyspnea on exertion, orthopnea.  No PND.  No  lower extremity edema.  No palpitations.  No syncopal event.  He denies  any coughing or wheezing.  No hematuria, dysuria.  No bright red blood  per rectum.  No melena.  No hematemesis.  All other symptoms are  negative.   PHYSICAL EXAMINATION:  Temperature 96.9.  Pulse 55.  Respirations 16.  Blood pressure 153/92.  He sated 100% on a nonrebreather.  GENERAL:  He appears in moderate distress.  HEENT:  Normocephalic, atraumatic.  NECK:  His JVP is approximately 7 cm, 2+ carotid upstroke, no bruits.  His heart is bradycardic.  Normal S1, split S2.  No murmurs appreciated.  His lungs are clear to auscultation throughout.  Abdomen is obese, soft, positive bowel sounds, nontender.  No  organomegaly.  EXTREMITIES:  He has no edema.  NEUROLOGIC:  He is alert and oriented to person, place, and time.  He is  moderately lethargic related to receiving Versed.  He is moving all  extremities and has appropriate strength.   Chest x-ray is pending.  EKG shows rate 64, sinus rhythm.  He has 5 mm  of inferior ST elevation in 2, 3 and aVF with 2 to 5 mm ST depression in  leads I, aVL, V1, and V2.   LABORATORY DATA:  His hematocrit is 42.7.  White count is 8.8.  Platelet  is 178.  His coags are normal.  Other labs are pending.   ASSESSMENT AND PLAN:  Patient is a 70 year old gentleman with history of  dyslipidemia here with an inferior-posterior ST elevation MI.  1. Coronary artery disease.  Patient is going immediately to the cath      lab for inferior ST elevation myocardial infarction (STEMI).  He      received aspirin and heparin in the emergency room and he is      heading to the lab.  2. Dyslipidemia.  Will check his lipids.  Will continue his Lipitor.      Lorain Childes, MD  Electronically Signed     CGF/MEDQ  D:  01/10/2007  T:  01/11/2007  Job:  [id number]

## 2012-01-21 ENCOUNTER — Encounter (INDEPENDENT_AMBULATORY_CARE_PROVIDER_SITE_OTHER): Payer: Self-pay | Admitting: Surgery

## 2012-01-28 ENCOUNTER — Encounter (INDEPENDENT_AMBULATORY_CARE_PROVIDER_SITE_OTHER): Payer: Self-pay | Admitting: Surgery

## 2012-02-03 ENCOUNTER — Encounter (INDEPENDENT_AMBULATORY_CARE_PROVIDER_SITE_OTHER): Payer: Self-pay | Admitting: Surgery

## 2012-02-17 ENCOUNTER — Encounter (INDEPENDENT_AMBULATORY_CARE_PROVIDER_SITE_OTHER): Payer: Self-pay | Admitting: Surgery

## 2012-10-25 ENCOUNTER — Ambulatory Visit (INDEPENDENT_AMBULATORY_CARE_PROVIDER_SITE_OTHER): Payer: Medicare Other | Admitting: Nurse Practitioner

## 2012-10-25 ENCOUNTER — Encounter: Payer: Self-pay | Admitting: Nurse Practitioner

## 2012-10-25 ENCOUNTER — Ambulatory Visit
Admission: RE | Admit: 2012-10-25 | Discharge: 2012-10-25 | Disposition: A | Discharge status: Home / Self Care | Payer: Medicare Other | Source: Ambulatory Visit | Attending: Nurse Practitioner | Admitting: Nurse Practitioner

## 2012-10-25 ENCOUNTER — Telehealth: Payer: Self-pay | Admitting: Nurse Practitioner

## 2012-10-25 VITALS — BP 140/100 | HR 93 | Ht 71.0 in | Wt 244.4 lb

## 2012-10-25 DIAGNOSIS — I2510 Atherosclerotic heart disease of native coronary artery without angina pectoris: Secondary | ICD-10-CM

## 2012-10-25 DIAGNOSIS — I4891 Unspecified atrial fibrillation: Secondary | ICD-10-CM

## 2012-10-25 LAB — HEPATIC FUNCTION PANEL
ALT: 30 U/L (ref 0–53)
AST: 30 U/L (ref 0–37)
Albumin: 3.9 g/dL (ref 3.5–5.2)
Alkaline Phosphatase: 54 U/L (ref 39–117)
Bilirubin, Direct: 0.2 mg/dL (ref 0.0–0.3)
Total Bilirubin: 1.1 mg/dL (ref 0.3–1.2)
Total Protein: 7.4 g/dL (ref 6.0–8.3)

## 2012-10-25 LAB — CBC WITH DIFFERENTIAL/PLATELET
Basophils Absolute: 0 10*3/uL (ref 0.0–0.1)
Basophils Relative: 0.4 % (ref 0.0–3.0)
Eosinophils Absolute: 0.4 10*3/uL (ref 0.0–0.7)
Eosinophils Relative: 4.6 % (ref 0.0–5.0)
HCT: 54.1 % — ABNORMAL HIGH (ref 39.0–52.0)
Hemoglobin: 18.1 g/dL (ref 13.0–17.0)
Lymphocytes Relative: 26 % (ref 12.0–46.0)
Lymphs Abs: 2.3 10*3/uL (ref 0.7–4.0)
MCHC: 33.4 g/dL (ref 30.0–36.0)
MCV: 89.4 fl (ref 78.0–100.0)
Monocytes Absolute: 1.1 10*3/uL — ABNORMAL HIGH (ref 0.1–1.0)
Monocytes Relative: 11.9 % (ref 3.0–12.0)
Neutro Abs: 5.1 10*3/uL (ref 1.4–7.7)
Neutrophils Relative %: 57.1 % (ref 43.0–77.0)
Platelets: 154 10*3/uL (ref 150.0–400.0)
RBC: 6.05 Mil/uL — ABNORMAL HIGH (ref 4.22–5.81)
RDW: 14.2 % (ref 11.5–14.6)
WBC: 8.9 10*3/uL (ref 4.5–10.5)

## 2012-10-25 LAB — BASIC METABOLIC PANEL
BUN: 20 mg/dL (ref 6–23)
CO2: 24 mEq/L (ref 19–32)
Calcium: 9.1 mg/dL (ref 8.4–10.5)
Chloride: 101 mEq/L (ref 96–112)
Creatinine, Ser: 1.2 mg/dL (ref 0.4–1.5)
GFR: 64.33 mL/min (ref >60.00)
Glucose, Bld: 130 mg/dL — ABNORMAL HIGH (ref 70–99)
Potassium: 4.2 mEq/L (ref 3.5–5.1)
Sodium: 133 mEq/L — ABNORMAL LOW (ref 135–145)

## 2012-10-25 LAB — LIPID PANEL
Cholesterol: 172 mg/dL (ref 0–200)
HDL: 33.4 mg/dL — ABNORMAL LOW (ref >39.00)
LDL Cholesterol: 123 mg/dL — ABNORMAL HIGH (ref 0–99)
Total CHOL/HDL Ratio: 5
Triglycerides: 78 mg/dL (ref 0.0–149.0)
VLDL: 15.6 mg/dL (ref 0.0–40.0)

## 2012-10-25 LAB — TSH: TSH: 1.36 u[IU]/mL (ref 0.35–5.50)

## 2012-10-25 MED ORDER — METOPROLOL SUCCINATE ER 25 MG PO TB24: 25.0000 mg | ORAL_TABLET | Freq: Two times a day (BID) | ORAL | Status: DC

## 2012-10-25 MED ORDER — RIVAROXABAN 20 MG PO TABS: 20.0000 mg | ORAL_TABLET | Freq: Every day | ORAL | Status: DC

## 2012-10-25 NOTE — Patient Instructions (Addendum)
You are in atrial fibrillation. This increases your risk for stroke  I would like for you to stop all your supplements  I would like for you to increase your Metoprolol to two times a day  We are going to start Xarelto 20 mg a day - take with your largest meal of the day   We need to check labs today  We are going to get an ultrasound of your heart  I am going to send you for a CXR today - go to [facility] to [facility] - first floor - just walk in.  See Dr. Excell Seltzer in 3 weeks - if you are still out of rhythm then we will probably set up a cardioversion.  Call the [facility] office at [phone] if you have any questions, problems or concerns.

## 2012-10-25 NOTE — Progress Notes (Signed)
[name] Date of Birth: Sep 25, 1941 Medical Record #[id number]  History of Present Illness: [name] is seen today for a follow up visit. He is seen for Dr. Excell Seltzer. His last visit here was in February of 2009. He has known CAD with prior inferior MI. Has moderate LAD disease and was treated medically. He has had a tendency towards bradycardia. His other issues include HLD, HTN and allergies. Plavix was stopped at his last visit along with his low dose metoprolol.   He comes in today. He is here alone. Since he was last here, he has become diabetic. He remains obese. He is here because he has noticed that his heart rate has gotten faster. It is typically in the 40 to 50's. He has had bronchitis over the past 2 to 3 weeks and has noticed the heart rate since that time. He is not dizzy, lightheaded. He denies chest pain. Not short of breath. Continues to exercise for about 30 minutes a day. He is on a massive amount of supplements. Saw Dr. Su Hilt on Friday. Told to come here. Started low dose metoprolol and aspirin Friday. Says his bronchitis is improving but has coughed up a "few" streaks of blood.   Current Outpatient Prescriptions on File Prior to Visit  Medication Sig Dispense Refill  . Blood Glucose Monitoring Suppl (FREESTYLE LITE) DEVI by Does not apply route.      . cetirizine (ZYRTEC) 10 MG tablet Take 10 mg by mouth 2 (two) times daily.       Marland Kitchen ezetimibe (ZETIA) 10 MG tablet Take 10 mg by mouth daily.      . metFORMIN (GLUCOPHAGE) 500 MG tablet Take 500 mg by mouth daily with breakfast.      . metoprolol succinate (TOPROL-XL) 25 MG 24 hr tablet Take 1 tablet (25 mg total) by mouth 2 (two) times daily.  60 tablet  6  . Multiple Vitamins-Minerals (MULTIVITAMIN WITH MINERALS) tablet Take 1 tablet by mouth daily.      . sitaGLIPtin (JANUVIA) 100 MG tablet Take 100 mg by mouth daily.      . Rivaroxaban (XARELTO) 20 MG TABS Take 1 tablet (20 mg total) by mouth daily.  30 tablet  6     Allergies  Allergen Reactions  . Asa Arthritis Strength-Antacid (Aspirin Buffered)     Bleeding tendencies   . Metformin And Related     headaches  . Statins     Aches     Past Medical History  Diagnosis Date  . Diabetes mellitus   . Coronary artery disease     remote inferior MI; moderate LAD disease- managed medically  . Hyperlipidemia   . Allergy   . HTN (hypertension)   . Atrial fibrillation   . Bronchitis     Past Surgical History  Procedure Date  . Tonsillectomy 1952  . Coronary angioplasty with stent placement   . Nasal septum surgery 2004    and sinus repair penta  . Umbilical hernia repair 01/2010  . Hernia repair 12/18/2009    Incarcerated umbilical hernia    History  Smoking status  . Former Smoker  . Quit date: 01/20/1979  Smokeless tobacco  . Not on file    History  Alcohol Use  . Yes    Comment: rare    History reviewed. No pertinent family history.  Review of Systems: The review of systems is per the HPI.  All other systems were reviewed and are negative.  Physical Exam: BP 140/100  Pulse 93  Ht 5\' 11"  (1.803 m)  Wt 244 lb 6.4 oz (110.859 kg)  BMI 34.09 kg/m2 Weight is up 4 pounds since last visit here.  Patient is very pleasant and in no acute distress. He is obese. Skin is warm and dry. Color is normal.  HEENT is unremarkable. Normocephalic/atraumatic. PERRL. Sclera are nonicteric. Neck is supple. No masses. No JVD. Lungs are fairly clear. Few wheezes noted. Cardiac exam shows an irregular rate and rhythm. He is tachycardia.  Abdomen is obese but soft. Extremities are without edema. Gait and ROM are intact. No gross neurologic deficits noted.   LABORATORY DATA: No recent labs reported.  EKG today shows atrial fib with rate of 93. Inferior MI. PVC noted.   Lab Results  Component Value Date   WBC 6.4 12/14/2009   HGB 17.8* 12/14/2009   HCT 52.7* 12/14/2009   PLT 163 12/14/2009   GLUCOSE 163* 12/14/2009   CHOL 94 01/20/2008    TRIG 40 01/20/2008   HDL 29.8* 01/20/2008   LDLCALC 56 01/20/2008   ALT 48 12/14/2009   AST 35 12/14/2009   NA 138 12/14/2009   K 4.7 12/14/2009   CL 105 12/14/2009   CREATININE 1.19 12/14/2009   BUN 15 12/14/2009   CO2 25 12/14/2009   HGBA1C 5.7 04/05/2007     Assessment / Plan:  1. CAD - no chest pain reported. May end up with repeat stress testing. Says he had chest pain with his initial event and currently has no chest pain.   2. HTN - blood pressure is high. He says it has been normal at his PCP's.   3. HLD - on Zetia  4. New atrial fib - not very symptomatic. Has an elevated CHADs. I have started Xarelto 20 mg a day. His largest meal is breakfast. Check labs today. Arrange for an echo and a CXR as well. His rate is not well controlled. Will increase the metoprolol to BID. See Dr. Excell Seltzer back in about 3 weeks for possible cardioversion. He is quite adamant about not wanting coumadin and not wanting to be on these medicines long term. I have asked him to stop all the supplements that he is taking as well.   5. Bronchitis - will check CXR today as well with the starting of Xarelto.    Patient is agreeable to this plan and will call if any problems develop in the interim.

## 2012-10-25 NOTE — Telephone Encounter (Signed)
New Problem:    Patient called in because he was given a blood thinner and has Hemorids and wanted to know if this would be ok.  Please call back.

## 2012-10-26 ENCOUNTER — Telehealth: Payer: Self-pay | Admitting: *Deleted

## 2012-10-26 ENCOUNTER — Ambulatory Visit (HOSPITAL_COMMUNITY): Payer: Medicare Other | Attending: Cardiology | Admitting: Radiology

## 2012-10-26 DIAGNOSIS — I10 Essential (primary) hypertension: Secondary | ICD-10-CM | POA: Insufficient documentation

## 2012-10-26 DIAGNOSIS — I252 Old myocardial infarction: Secondary | ICD-10-CM | POA: Insufficient documentation

## 2012-10-26 DIAGNOSIS — I4891 Unspecified atrial fibrillation: Secondary | ICD-10-CM

## 2012-10-26 DIAGNOSIS — E119 Type 2 diabetes mellitus without complications: Secondary | ICD-10-CM | POA: Insufficient documentation

## 2012-10-26 DIAGNOSIS — E785 Hyperlipidemia, unspecified: Secondary | ICD-10-CM | POA: Insufficient documentation

## 2012-10-26 DIAGNOSIS — E669 Obesity, unspecified: Secondary | ICD-10-CM | POA: Insufficient documentation

## 2012-10-26 DIAGNOSIS — I517 Cardiomegaly: Secondary | ICD-10-CM | POA: Insufficient documentation

## 2012-10-26 DIAGNOSIS — I2510 Atherosclerotic heart disease of native coronary artery without angina pectoris: Secondary | ICD-10-CM | POA: Insufficient documentation

## 2012-10-26 NOTE — Telephone Encounter (Signed)
t/w pt about hemoroids discussed w/ Dr. Thomasene Lot that xarelto will not affect this issue pt verbally understood

## 2012-10-26 NOTE — Progress Notes (Signed)
Echocardiogram performed.  

## 2012-11-05 ENCOUNTER — Telehealth: Payer: Self-pay | Admitting: Physician Assistant

## 2012-11-05 NOTE — Telephone Encounter (Signed)
Pt called, returning a call from [facility], but did get the voice mail and was OK with the message and plan. He is not having any acute problems or issues. Will keep followup appt and call with any problems.

## 2012-11-16 ENCOUNTER — Encounter: Payer: Self-pay | Admitting: Cardiovascular Disease

## 2012-11-16 ENCOUNTER — Ambulatory Visit (INDEPENDENT_AMBULATORY_CARE_PROVIDER_SITE_OTHER): Payer: Medicare Other | Admitting: Cardiovascular Disease

## 2012-11-16 ENCOUNTER — Encounter: Payer: Self-pay | Admitting: *Deleted

## 2012-11-16 VITALS — BP 130/84 | HR 64 | Ht 71.0 in | Wt 243.0 lb

## 2012-11-16 DIAGNOSIS — I4891 Unspecified atrial fibrillation: Secondary | ICD-10-CM

## 2012-11-16 NOTE — Progress Notes (Signed)
HPI:  [name] returns for followup evaluation. 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 He was treated with a 4.0 by 16mm liberte bare-metal stent in the RCA. The LAD had moderate stenosis in the mid vessel and the left circumflex was widely patent. The patient's left ventricular ejection fraction at the time of his MI was 55%. He's also been followed for hypertension hyperlipidemia. He was lost to followup for a few years but returned November 25 and was seen by Norma Fredrickson. At that time he was noted to be in atrial fibrillation and he was started on Xarelto 20 mg daily for anticoagulation with plans for elective cardioversion.  Overall the patient feels well. He's had no bleeding problems since starting on anticoagulation therapy. He continues to be able to do physical work without symptoms. He denies dyspnea, chest pain or tightness, edema, or palpitations.  Outpatient Encounter Prescriptions as of 11/16/2012  Medication Sig Dispense Refill  . metFORMIN (GLUCOPHAGE) 500 MG tablet Take 500 mg by mouth daily with breakfast.      . metoprolol succinate (TOPROL-XL) 25 MG 24 hr tablet Take 1 tablet (25 mg total) by mouth 2 (two) times daily.  60 tablet  6  . Rivaroxaban (XARELTO) 20 MG TABS Take 1 tablet (20 mg total) by mouth daily.  30 tablet  6  . sitaGLIPtin (JANUVIA) 100 MG tablet Take 100 mg by mouth daily.      . Blood Glucose Monitoring Suppl (FREESTYLE LITE) DEVI by Does not apply route.      . cephALEXin (KEFLEX) 500 MG capsule Take 500 mg by mouth 4 (four) times daily.      . cetirizine (ZYRTEC) 10 MG tablet Take 10 mg by mouth 2 (two) times daily.       . cholecalciferol (VITAMIN D) 400 UNITS TABS Take 5,000 Units by mouth daily.      . ciprofloxacin (CIPRO) 250 MG tablet Take 250 mg by mouth 2 (two) times daily.      Marland Kitchen co-enzyme Q-10 50 MG capsule Take 100 mg by mouth daily.      Marland Kitchen DHEA 10 MG CAPS Take 400 mg by mouth daily.        Marland Kitchen ezetimibe (ZETIA) 10 MG tablet Take 10 mg by mouth daily.      Marland Kitchen glucosamine-chondroitin 500-400 MG tablet Take 2 tablets by mouth daily.      . Multiple Vitamins-Minerals (MULTIVITAMIN WITH MINERALS) tablet Take 1 tablet by mouth daily.      . NON FORMULARY Take 450 mg by mouth daily. Pro-pancreas      . NON FORMULARY Take by mouth daily. coffeegenic      . NON FORMULARY daily. alj      . NON FORMULARY Take 375 mg by mouth daily. Liver care      . NON FORMULARY Take 300 mcg by mouth daily. Chromium-gtf      . NON FORMULARY Take 780 mg by mouth daily. metabomax plus      . NON FORMULARY Take by mouth daily. Blood sugar formula      . NON FORMULARY Take 2,200 mcg by mouth daily. Super k2      . Omega-3 Fatty Acids (SUPER OMEGA-3) 1000 MG CAPS Take by mouth daily.      . Red Yeast Rice 600 MG CAPS Take 600 mg by mouth daily.        Allergies  Allergen Reactions  . Asa Arthritis  Strength-Antacid (Aspirin Buffered)     Bleeding tendencies   . Metformin And Related     headaches  . Statins     Aches     Past Medical History  Diagnosis Date  . Diabetes mellitus   . Coronary artery disease     remote inferior MI; moderate LAD disease- managed medically  . Hyperlipidemia   . Allergy   . HTN (hypertension)   . Atrial fibrillation   . Bronchitis     ROS: Negative except as per HPI  BP 130/84  Pulse 64  Ht 5\' 11"  (1.803 m)  Wt 110.224 kg (243 lb)  BMI 33.89 kg/m2  SpO2 94%  PHYSICAL EXAM: Pt is alert and oriented, NAD HEENT: normal Neck: JVP - normal, carotids 2+= without bruits Lungs: CTA bilaterally CV: Irregularly irregular without murmur or gallop Abd: soft, NT, Positive BS, no hepatomegaly Ext: no C/C/E, distal pulses intact and equal Skin: warm/dry no rash  EKG:  Atrial fibrillation with age-indeterminate inferior MI  Echo: Study Conclusions  - Left ventricle: The cavity size was normal. Wall thickness was increased in a pattern of mild LVH.  Indeterminant diastolic function (atrial fibrillation). Systolic function was moderately reduced. The estimated ejection fraction was in the range of 35% to 40%. Posterior akinesis. Basal to mid anterolateral and basal to mid inferior severe hypokinesis. - Aortic valve: There was no stenosis. - Mitral valve: Trivial regurgitation. - Left atrium: The atrium was moderately dilated. - Right ventricle: The cavity size was normal. Systolic function was normal. - Right atrium: The atrium was mildly to moderately dilated. - Pulmonary arteries: No complete TR doppler jet, so unable to estimate PA systolic pressure. - Inferior vena cava: The vessel was normal in size; the respirophasic diameter changes were in the normal range (= 50%); findings are consistent with normal central venous pressure. Impressions:  - The patient was in atrial fibrillation. Normal LV size with mild LV hypertrophy. EF 35-40% with posterior akinesis and basal to mid inferior and anterolateral severe hypokinesis. Normal RV size and systolic function.  ASSESSMENT AND PLAN: 1. Atrial fibrillation. The patient seems to be tolerating this reasonably well. However, considering his LV dysfunction and known CAD, I think it would be best to try to restore sinus rhythm to prevent atrial remodeling and potentially permanent atrial fibrillation. He is maintained on anticoagulation and we will arrange elective cardioversion the week after Christmas. His heart rate is reasonably controlled at the present time.  2. Coronary artery disease, native vessel. The patient has a history of myocardial infarction. He had preserved LV function initially, but has developed moderate to severe segmental LV dysfunction. I think he will require cardiac catheterization to rule out progressive CAD. He is at risk for silent ischemia in the setting of his diabetes. Will plan on cardiac catheterization after one month of anticoagulation following  cardioversion.  3. Hypertension, essential. Controlled on current medical therapy.  [name] 11/16/2012 12:03 PM

## 2012-11-16 NOTE — Patient Instructions (Addendum)
Please follow the instruction sheet given 

## 2012-11-22 ENCOUNTER — Other Ambulatory Visit: Payer: Medicare Other

## 2012-11-23 ENCOUNTER — Other Ambulatory Visit (INDEPENDENT_AMBULATORY_CARE_PROVIDER_SITE_OTHER): Payer: Medicare Other

## 2012-11-23 DIAGNOSIS — I4891 Unspecified atrial fibrillation: Secondary | ICD-10-CM

## 2012-11-23 LAB — CBC WITH DIFFERENTIAL/PLATELET
Basophils Absolute: 0 10*3/uL (ref 0.0–0.1)
Basophils Relative: 0.4 % (ref 0.0–3.0)
Eosinophils Absolute: 0.3 10*3/uL (ref 0.0–0.7)
Eosinophils Relative: 2.8 % (ref 0.0–5.0)
HCT: 50.8 % (ref 39.0–52.0)
Hemoglobin: 17.5 g/dL — ABNORMAL HIGH (ref 13.0–17.0)
Lymphocytes Relative: 23.5 % (ref 12.0–46.0)
Lymphs Abs: 2.2 10*3/uL (ref 0.7–4.0)
MCHC: 34.5 g/dL (ref 30.0–36.0)
MCV: 88.1 fl (ref 78.0–100.0)
Monocytes Absolute: 0.9 10*3/uL (ref 0.1–1.0)
Monocytes Relative: 9.4 % (ref 3.0–12.0)
Neutro Abs: 5.9 10*3/uL (ref 1.4–7.7)
Neutrophils Relative %: 63.9 % (ref 43.0–77.0)
Platelets: 156 10*3/uL (ref 150.0–400.0)
RBC: 5.77 Mil/uL (ref 4.22–5.81)
RDW: 15.6 % — ABNORMAL HIGH (ref 11.5–14.6)
WBC: 9.3 10*3/uL (ref 4.5–10.5)

## 2012-11-23 LAB — BASIC METABOLIC PANEL
BUN: 24 mg/dL — ABNORMAL HIGH (ref 6–23)
CO2: 23 mEq/L (ref 19–32)
Calcium: 9.2 mg/dL (ref 8.4–10.5)
Chloride: 103 mEq/L (ref 96–112)
Creatinine, Ser: 1.6 mg/dL — ABNORMAL HIGH (ref 0.4–1.5)
GFR: 46.71 mL/min — ABNORMAL LOW (ref >60.00)
Glucose, Bld: 127 mg/dL — ABNORMAL HIGH (ref 70–99)
Potassium: 4.3 mEq/L (ref 3.5–5.1)
Sodium: 134 mEq/L — ABNORMAL LOW (ref 135–145)

## 2012-11-30 ENCOUNTER — Ambulatory Visit (HOSPITAL_COMMUNITY)
Admission: RE | Admit: 2012-11-30 | Discharge: 2012-11-30 | Disposition: A | Discharge status: Home / Self Care | Payer: Medicare Other | Source: Ambulatory Visit | Attending: Cardiovascular Disease | Admitting: Cardiovascular Disease

## 2012-11-30 ENCOUNTER — Encounter (HOSPITAL_COMMUNITY): Payer: Self-pay | Admitting: Certified Registered Nurse Anesthetist

## 2012-11-30 ENCOUNTER — Ambulatory Visit (HOSPITAL_COMMUNITY): Payer: Medicare Other | Admitting: Certified Registered Nurse Anesthetist

## 2012-11-30 ENCOUNTER — Encounter: Payer: Self-pay | Admitting: *Deleted

## 2012-11-30 ENCOUNTER — Encounter (HOSPITAL_COMMUNITY): Payer: Self-pay | Admitting: *Deleted

## 2012-11-30 ENCOUNTER — Encounter (HOSPITAL_COMMUNITY)
Admission: RE | Disposition: A | Discharge status: Home / Self Care | Payer: Self-pay | Source: Ambulatory Visit | Attending: Cardiovascular Disease

## 2012-11-30 DIAGNOSIS — Z79899 Other long term (current) drug therapy: Secondary | ICD-10-CM | POA: Insufficient documentation

## 2012-11-30 DIAGNOSIS — I2510 Atherosclerotic heart disease of native coronary artery without angina pectoris: Secondary | ICD-10-CM | POA: Insufficient documentation

## 2012-11-30 DIAGNOSIS — I4891 Unspecified atrial fibrillation: Secondary | ICD-10-CM | POA: Insufficient documentation

## 2012-11-30 DIAGNOSIS — E109 Type 1 diabetes mellitus without complications: Secondary | ICD-10-CM | POA: Insufficient documentation

## 2012-11-30 DIAGNOSIS — I10 Essential (primary) hypertension: Secondary | ICD-10-CM | POA: Insufficient documentation

## 2012-11-30 DIAGNOSIS — Z7901 Long term (current) use of anticoagulants: Secondary | ICD-10-CM | POA: Insufficient documentation

## 2012-11-30 HISTORY — PX: CARDIOVERSION: SHX1299

## 2012-11-30 HISTORY — DX: Sleep apnea, unspecified: G47.30

## 2012-11-30 LAB — GLUCOSE, CAPILLARY: Glucose-Capillary: 118 mg/dL — ABNORMAL HIGH (ref 70–99)

## 2012-11-30 SURGERY — CARDIOVERSION
Anesthesia: Monitor Anesthesia Care | Wound class: Clean

## 2012-11-30 MED ORDER — SODIUM CHLORIDE 0.9 % IJ SOLN: 3.0000 mL | Freq: Two times a day (BID) | INTRAMUSCULAR | Status: DC

## 2012-11-30 MED ORDER — SODIUM CHLORIDE 0.9 % IV SOLN
INTRAVENOUS | Status: DC | PRN
  Administered 2012-11-30: 09:00:00 via INTRAVENOUS

## 2012-11-30 MED ORDER — PROPOFOL 10 MG/ML IV BOLUS
INTRAVENOUS | Status: DC | PRN
  Administered 2012-11-30: 80 mg via INTRAVENOUS

## 2012-11-30 MED ORDER — SODIUM CHLORIDE 0.9 % IJ SOLN: 3.0000 mL | INTRAMUSCULAR | Status: DC | PRN

## 2012-11-30 MED ORDER — SODIUM CHLORIDE 0.9 % IV SOLN: 250.0000 mL | INTRAVENOUS | Status: DC

## 2012-11-30 NOTE — Interval H&P Note (Signed)
History and Physical Interval Note:  11/30/2012 10:06 AM  [name]  has presented today for surgery, with the diagnosis of AFib  The various methods of treatment have been discussed with the patient and family. After consideration of risks, benefits and other options for treatment, the patient has consented to  Procedure(s) (LRB) with comments: CARDIOVERSION ([name]) as a surgical intervention .  The patient's history has been reviewed, patient examined, no change in status, stable for surgery.  I have reviewed the patient's chart and labs.  Questions were answered to the patient's satisfaction.     [name]

## 2012-11-30 NOTE — Anesthesia Preprocedure Evaluation (Addendum)
Anesthesia Evaluation  Patient identified by MRN, date of birth, ID band Patient awake    Reviewed: Allergy & Precautions, H&P , NPO status , Patient's Chart, lab work & pertinent test results, reviewed documented beta blocker date and time   Airway Mallampati: II TM Distance: >3 FB Neck ROM: Full    Dental  (+) Teeth Intact and Dental Advisory Given   Pulmonary neg pulmonary ROS, sleep apnea , former smoker,  OSA--does not wear CPAP   Pulmonary exam normal       Cardiovascular hypertension, Pt. on home beta blockers and Pt. on medications + CAD and + Cardiac Stents + dysrhythmias (Pt on Xarelto.) Atrial Fibrillation Rhythm:Irregular Rate:Normal     Neuro/Psych negative neurological ROS  negative psych ROS   GI/Hepatic negative GI ROS, Neg liver ROS,   Endo/Other  diabetes, Well Controlled, Type 1  Renal/GU negative Renal ROS     Musculoskeletal negative musculoskeletal ROS (+)   Abdominal (+) + obese,   Peds  Hematology negative hematology ROS (+)   Anesthesia Other Findings   Reproductive/Obstetrics                           Anesthesia Physical Anesthesia Plan  ASA: II  Anesthesia Plan: General   Post-op Pain Management:    Induction: Intravenous  Airway Management Planned: Simple Face Mask  Additional Equipment:   Intra-op Plan:   Post-operative Plan:   Informed Consent: I have reviewed the patients History and Physical, chart, labs and discussed the procedure including the risks, benefits and alternatives for the proposed anesthesia with the patient or authorized representative who has indicated his/her understanding and acceptance.   Dental advisory given  Plan Discussed with: CRNA and Surgeon  Anesthesia Plan Comments:        Anesthesia Quick Evaluation

## 2012-11-30 NOTE — CV Procedure (Signed)
   Cardioversion Procedure Note  Name: [name] MRN: [id number] DOB: [birth year]  Procedure: Cardioversion Indication: Atrial Fibrillation Informed Consent was obtained. I verified the patient has not missed any doses of Xarelto over the past 30 days. His initial rhythms was atrial fibrillation.   80 mg Propofol was administered by anesthesia. The patient was successfully cardioverted with 120 Joules synchronized cardioversion. Post-cardioversion rhythm is NSR 56 bpm with rare PVC's.   Post cardioversion the patient is awake and alert. Moves all extremities to command.  Assessment: Successful DCCV  Follow-up 2 weeks with repeat EKG. Plan cardiac cath after 4 weeks of anticoagulation with Xarelto post-cardioversion.  [name] 11/30/2012 10:13 AM

## 2012-11-30 NOTE — Transfer of Care (Signed)
Immediate Anesthesia Transfer of Care Note  Patient: [name]  Procedure(s) Performed: Procedure(s) (LRB) with comments: CARDIOVERSION ([name])  Patient Location: Nursing Unit  Anesthesia Type:MAC  Level of Consciousness: awake, alert  and oriented  Airway & Oxygen Therapy: Patient Spontanous Breathing  Post-op Assessment: Report given to PACU RN, Post -op Vital signs reviewed and stable and Patient moving all extremities  Post vital signs: Reviewed and stable  Complications: No apparent anesthesia complications

## 2012-11-30 NOTE — H&P (View-Only) (Signed)
 HPI:  [name] returns for followup evaluation. 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 He was treated with a 4.0 by 16mm liberte bare-metal stent in the RCA. The LAD had moderate stenosis in the mid vessel and the left circumflex was widely patent. The patient's left ventricular ejection fraction at the time of his MI was 55%. He's also been followed for hypertension hyperlipidemia. He was lost to followup for a few years but returned November 25 and was seen by [name]. At that time he was noted to be in atrial fibrillation and he was started on Xarelto 20 mg daily for anticoagulation with plans for elective cardioversion.  Overall the patient feels well. He's had no bleeding problems since starting on anticoagulation therapy. He continues to be able to do physical work without symptoms. He denies dyspnea, chest pain or tightness, edema, or palpitations.  Outpatient Encounter Prescriptions as of 11/16/2012  Medication Sig Dispense Refill  . metFORMIN (GLUCOPHAGE) 500 MG tablet Take 500 mg by mouth daily with breakfast.      . metoprolol succinate (TOPROL-XL) 25 MG 24 hr tablet Take 1 tablet (25 mg total) by mouth 2 (two) times daily.  60 tablet  6  . Rivaroxaban (XARELTO) 20 MG TABS Take 1 tablet (20 mg total) by mouth daily.  30 tablet  6  . sitaGLIPtin (JANUVIA) 100 MG tablet Take 100 mg by mouth daily.      . Blood Glucose Monitoring Suppl (FREESTYLE LITE) DEVI by Does not apply route.      . cephALEXin (KEFLEX) 500 MG capsule Take 500 mg by mouth 4 (four) times daily.      . cetirizine (ZYRTEC) 10 MG tablet Take 10 mg by mouth 2 (two) times daily.       . cholecalciferol (VITAMIN D) 400 UNITS TABS Take 5,000 Units by mouth daily.      . ciprofloxacin (CIPRO) 250 MG tablet Take 250 mg by mouth 2 (two) times daily.      . co-enzyme Q-10 50 MG capsule Take 100 mg by mouth daily.      . DHEA 10 MG CAPS Take 400 mg by mouth daily.        . ezetimibe (ZETIA) 10 MG tablet Take 10 mg by mouth daily.      . glucosamine-chondroitin 500-400 MG tablet Take 2 tablets by mouth daily.      . Multiple Vitamins-Minerals (MULTIVITAMIN WITH MINERALS) tablet Take 1 tablet by mouth daily.      . NON FORMULARY Take 450 mg by mouth daily. Pro-pancreas      . NON FORMULARY Take by mouth daily. coffeegenic      . NON FORMULARY daily. alj      . NON FORMULARY Take 375 mg by mouth daily. Liver care      . NON FORMULARY Take 300 mcg by mouth daily. Chromium-gtf      . NON FORMULARY Take 780 mg by mouth daily. metabomax plus      . NON FORMULARY Take by mouth daily. Blood sugar formula      . NON FORMULARY Take 2,200 mcg by mouth daily. Super k2      . Omega-3 Fatty Acids (SUPER OMEGA-3) 1000 MG CAPS Take by mouth daily.      . Red Yeast Rice 600 MG CAPS Take 600 mg by mouth daily.        Allergies  Allergen Reactions  . Asa Arthritis   Strength-Antacid (Aspirin Buffered)     Bleeding tendencies   . Metformin And Related     headaches  . Statins     Aches     Past Medical History  Diagnosis Date  . Diabetes mellitus   . Coronary artery disease     remote inferior MI; moderate LAD disease- managed medically  . Hyperlipidemia   . Allergy   . HTN (hypertension)   . Atrial fibrillation   . Bronchitis     ROS: Negative except as per HPI  BP 130/84  Pulse 64  Ht 5' 11" (1.803 m)  Wt 110.224 kg (243 lb)  BMI 33.89 kg/m2  SpO2 94%  PHYSICAL EXAM: Pt is alert and oriented, NAD HEENT: normal Neck: JVP - normal, carotids 2+= without bruits Lungs: CTA bilaterally CV: Irregularly irregular without murmur or gallop Abd: soft, NT, Positive BS, no hepatomegaly Ext: no C/C/E, distal pulses intact and equal Skin: warm/dry no rash  EKG:  Atrial fibrillation with age-indeterminate inferior MI  Echo: Study Conclusions  - Left ventricle: The cavity size was normal. Wall thickness was increased in a pattern of mild LVH.  Indeterminant diastolic function (atrial fibrillation). Systolic function was moderately reduced. The estimated ejection fraction was in the range of 35% to 40%. Posterior akinesis. Basal to mid anterolateral and basal to mid inferior severe hypokinesis. - Aortic valve: There was no stenosis. - Mitral valve: Trivial regurgitation. - Left atrium: The atrium was moderately dilated. - Right ventricle: The cavity size was normal. Systolic function was normal. - Right atrium: The atrium was mildly to moderately dilated. - Pulmonary arteries: No complete TR doppler jet, so unable to estimate PA systolic pressure. - Inferior vena cava: The vessel was normal in size; the respirophasic diameter changes were in the normal range (= 50%); findings are consistent with normal central venous pressure. Impressions:  - The patient was in atrial fibrillation. Normal LV size with mild LV hypertrophy. EF 35-40% with posterior akinesis and basal to mid inferior and anterolateral severe hypokinesis. Normal RV size and systolic function.  ASSESSMENT AND PLAN: 1. Atrial fibrillation. The patient seems to be tolerating this reasonably well. However, considering his LV dysfunction and known CAD, I think it would be best to try to restore sinus rhythm to prevent atrial remodeling and potentially permanent atrial fibrillation. He is maintained on anticoagulation and we will arrange elective cardioversion the week after Christmas. His heart rate is reasonably controlled at the present time.  2. Coronary artery disease, native vessel. The patient has a history of myocardial infarction. He had preserved LV function initially, but has developed moderate to severe segmental LV dysfunction. I think he will require cardiac catheterization to rule out progressive CAD. He is at risk for silent ischemia in the setting of his diabetes. Will plan on cardiac catheterization after one month of anticoagulation following  cardioversion.  3. Hypertension, essential. Controlled on current medical therapy.  Kinze Labo 11/16/2012 12:03 PM      

## 2012-11-30 NOTE — Anesthesia Postprocedure Evaluation (Signed)
  Anesthesia Post-op Note  Patient: [name]  Procedure(s) Performed: Procedure(s) (LRB) with comments: CARDIOVERSION ([name])  Patient Location: Endoscopy Unit  Anesthesia Type:MAC  Level of Consciousness: awake, alert  and oriented  Airway and Oxygen Therapy: Patient Spontanous Breathing  Post-op Pain: none  Post-op Assessment: Post-op Vital signs reviewed, Patient's Cardiovascular Status Stable, Respiratory Function Stable, Patent Airway, No signs of Nausea or vomiting and Pain level controlled  Post-op Vital Signs: Reviewed and stable  Complications: No apparent anesthesia complications

## 2012-12-02 ENCOUNTER — Encounter (HOSPITAL_COMMUNITY): Payer: Self-pay | Admitting: Cardiovascular Disease

## 2012-12-02 ENCOUNTER — Telehealth: Payer: Self-pay | Admitting: Cardiovascular Disease

## 2012-12-02 NOTE — Telephone Encounter (Signed)
I spoke with the pt and he checked his BP and pulse last night and it was 101/60, 52.  The pt was anxious today and it was 135/69, 73.  The pt would like to decrease his Metoprolol Succinate to once a day because he feels his BP is low. The pt will make this change, continue to monitor BP and pulse and call the office if he has any problems.  The pt does have a follow-up on 12/09/12 with Dr Excell Seltzer.

## 2012-12-02 NOTE — Telephone Encounter (Signed)
New Problem:    Patient would like to speak with you.  Please call back.

## 2012-12-08 ENCOUNTER — Telehealth: Payer: Self-pay | Admitting: Cardiovascular Disease

## 2012-12-08 NOTE — Telephone Encounter (Signed)
Pt's wife Lanora Manis [phone] would like a call after pt's husband's appt tomorrow that she will not be  able to attend due to a family emergency, if there is anything she needs to do to help with his medical care

## 2012-12-09 ENCOUNTER — Encounter: Payer: Self-pay | Admitting: Cardiovascular Disease

## 2012-12-09 ENCOUNTER — Ambulatory Visit (INDEPENDENT_AMBULATORY_CARE_PROVIDER_SITE_OTHER): Payer: Medicare Other | Admitting: Cardiovascular Disease

## 2012-12-09 ENCOUNTER — Encounter: Payer: Self-pay | Admitting: *Deleted

## 2012-12-09 VITALS — BP 125/78 | HR 92 | Ht 71.0 in | Wt 241.0 lb

## 2012-12-09 DIAGNOSIS — I2510 Atherosclerotic heart disease of native coronary artery without angina pectoris: Secondary | ICD-10-CM

## 2012-12-09 DIAGNOSIS — I255 Ischemic cardiomyopathy: Secondary | ICD-10-CM | POA: Insufficient documentation

## 2012-12-09 DIAGNOSIS — I2589 Other forms of chronic ischemic heart disease: Secondary | ICD-10-CM

## 2012-12-09 NOTE — Patient Instructions (Addendum)
You have been referred to Pulmonology to be evaluated for updated setting on your C-PAP machine.  You should return for [facility] lab work the week of February 3rd, 2014.  CBC, BMET, PT/INR, PTT

## 2012-12-09 NOTE — Progress Notes (Signed)
HPI:   72 year old gentleman presenting for followup evaluation. The patient has coronary artery disease with history of inferior wall myocardial infarction in 2008. He was treated with bare-metal stenting of the RCA. He was noted to have moderate LAD stenosis. His left ventricular function was normal at the time of his MI. He has more recently been diagnosed with atrial fibrillation. The patient was started on anticoagulation and he underwent elective cardioversion December 31. He presents today for followup evaluation. He's also been found to have LV dysfunction with a left ventricular ejection fraction of 35-40% by recent echo. There is segmental wall motion abnormalities left anterolateral hypokinesis and severe hypokinesis of the inferoposterior wall.  The patient is doing well today. He has no symptoms. He specifically denies chest pain, dyspnea, palpitations, or leg swelling. After cardioversion his heart rate initially was in the 60s. He notes that beginning on January 5 his heart rate was up in the 80 to 90s with a little bit lower blood pressure. He continues to take Xarelto.  Outpatient Encounter Prescriptions as of 12/09/2012  Medication Sig Dispense Refill  . cetirizine (ZYRTEC) 10 MG tablet Take 10 mg by mouth daily.       Marland Kitchen ezetimibe (ZETIA) 10 MG tablet Take 10 mg by mouth daily.      . metFORMIN (GLUCOPHAGE) 500 MG tablet Take 1/2 tab twice a day      . metoprolol succinate (TOPROL-XL) 25 MG 24 hr tablet Take 25 mg by mouth daily.      . Rivaroxaban (XARELTO) 20 MG TABS Take 1 tablet (20 mg total) by mouth daily.  30 tablet  6  . sitaGLIPtin (JANUVIA) 100 MG tablet Take 100 mg by mouth daily.      . [DISCONTINUED] Blood Glucose Monitoring Suppl (FREESTYLE LITE) DEVI by Does not apply route.      . [DISCONTINUED] cephALEXin (KEFLEX) 500 MG capsule Take 500 mg by mouth 4 (four) times daily.      . [DISCONTINUED] cholecalciferol (VITAMIN D) 400 UNITS TABS Take 5,000 Units by mouth  daily.      . [DISCONTINUED] ciprofloxacin (CIPRO) 250 MG tablet Take 250 mg by mouth 2 (two) times daily.      . [DISCONTINUED] co-enzyme Q-10 50 MG capsule Take 100 mg by mouth daily.      . [DISCONTINUED] DHEA 10 MG CAPS Take 400 mg by mouth daily.      . [DISCONTINUED] glucosamine-chondroitin 500-400 MG tablet Take 2 tablets by mouth daily.      . [DISCONTINUED] metoprolol succinate (TOPROL-XL) 25 MG 24 hr tablet Take 1 tablet (25 mg total) by mouth daily.  1 tablet  0  . [DISCONTINUED] Multiple Vitamins-Minerals (MULTIVITAMIN WITH MINERALS) tablet Take 1 tablet by mouth daily.      . [DISCONTINUED] NON FORMULARY Take 450 mg by mouth daily. Pro-pancreas      . [DISCONTINUED] NON FORMULARY Take by mouth daily. coffeegenic      . [DISCONTINUED] NON FORMULARY daily. alj      . [DISCONTINUED] NON FORMULARY Take 375 mg by mouth daily. Liver care      . [DISCONTINUED] NON FORMULARY Take 300 mcg by mouth daily. Chromium-gtf      . [DISCONTINUED] NON FORMULARY Take 780 mg by mouth daily. metabomax plus      . [DISCONTINUED] NON FORMULARY Take by mouth daily. Blood sugar formula      . [DISCONTINUED] NON FORMULARY Take 2,200 mcg by mouth daily. Super k2      . [  DISCONTINUED] Omega-3 Fatty Acids (SUPER OMEGA-3) 1000 MG CAPS Take by mouth daily.      . [DISCONTINUED] Red Yeast Rice 600 MG CAPS Take 600 mg by mouth daily.        Allergies  Allergen Reactions  . Asa Arthritis Strength-Antacid (Aspirin Buffered)     Bleeding tendencies   . Metformin And Related     headaches  . Statins     Aches     Past Medical History  Diagnosis Date  . Diabetes mellitus   . Coronary artery disease     remote inferior MI; moderate LAD disease- managed medically  . Hyperlipidemia   . Allergy   . Atrial fibrillation   . Bronchitis   . Sleep apnea     Dx sleep anea does not use C-PAP    ROS: Negative except as per HPI  BP 125/78  Pulse 92  Ht 5\' 11"  (1.803 m)  Wt 109.317 kg (241 lb)  BMI 33.61  kg/m2  PHYSICAL EXAM: Pt is alert and oriented, NAD HEENT: normal Neck: JVP - normal, carotids 2+= without bruits Lungs: CTA bilaterally CV: Irregularly irregular without murmur or gallop Abd: soft, NT, Positive BS Ext: no C/C/E, distal pulses intact and equal Skin: warm/dry no rash  EKG:  Atrial fibrillation 92 beats per minute, age-indeterminate inferior infarction.  2-D echo 10/26/2012: Study Conclusions  - Left ventricle: The cavity size was normal. Wall thickness was increased in a pattern of mild LVH. Indeterminant diastolic function (atrial fibrillation). Systolic function was moderately reduced. The estimated ejection fraction was in the range of 35% to 40%. Posterior akinesis. Basal to mid anterolateral and basal to mid inferior severe hypokinesis. - Aortic valve: There was no stenosis. - Mitral valve: Trivial regurgitation. - Left atrium: The atrium was moderately dilated. - Right ventricle: The cavity size was normal. Systolic function was normal. - Right atrium: The atrium was mildly to moderately dilated. - Pulmonary arteries: No complete TR doppler jet, so unable to estimate PA systolic pressure. - Inferior vena cava: The vessel was normal in size; the respirophasic diameter changes were in the normal range (= 50%); findings are consistent with normal central venous pressure. Impressions:  - The patient was in atrial fibrillation. Normal LV size with mild LV hypertrophy. EF 35-40% with posterior akinesis and basal to mid inferior and anterolateral severe hypokinesis. Normal RV size and systolic function.  ASSESSMENT AND PLAN: 1. Atrial fibrillation. Unfortunately the patient has early recurrence of atrial fib after elective cardioversion. He is asymptomatic so for now will continue with rate control and anticoagulation. I'm concerned about his LV dysfunction. Will proceed with cardiac catheterization to evaluate his LV dysfunction and make further  recommendations regarding his atrial fib after that time. I think his only antiarrhythmic option will be Tikosyn because of CAD and LV dysfunction. We may continue with rate-control and anticoagulation since he is tolerating AF fairly well. I also advised him to get back in to see Dr Maple Hudson with [facility] as obstructive sleep apnea may be driving his AFib. See discussion below.  2. Coronary artery disease with history of inferior MI. The patient has progressive LV dysfunction by echo. Progressive CAD needs to be excluded. He had moderate LAD stenosis at the time of his MI. I have reviewed the risks, indications, and alternatives to cardiac catheterization with the patient. We will need to wait until he has had a full 30 days of anticoagulation after his cardioversion. We'll plan on proceeding with cardiac  cath in February.  3. A cardiomyopathy. The patient appears stable on long-acting metoprolol. Some of his home blood pressure readings are very low with systolics in the 90s. Will hold off on an ACE or ARB because of low blood pressure.  [name] 12/09/2012 2:02 PM   [name] 12/09/2012 2:01 PM

## 2012-12-10 ENCOUNTER — Encounter: Payer: Self-pay | Admitting: Pulmonary Disease

## 2012-12-10 ENCOUNTER — Ambulatory Visit (INDEPENDENT_AMBULATORY_CARE_PROVIDER_SITE_OTHER): Payer: Medicare Other | Admitting: Pulmonary Disease

## 2012-12-10 VITALS — BP 110/80 | HR 77 | Temp 97.9°F | Ht 71.0 in | Wt 247.0 lb

## 2012-12-10 DIAGNOSIS — G4733 Obstructive sleep apnea (adult) (pediatric): Secondary | ICD-10-CM | POA: Insufficient documentation

## 2012-12-10 NOTE — Progress Notes (Signed)
  Subjective:    Patient ID: [name], male    DOB: 09-01-1941, 72 y.o.   MRN: [id number]  HPI The pt is a 72 y/o male who I have been asked to see for management of OSA.  He was diagnosed with mild to moderate osa in 2006, with NPSG showing AHI 16/hr.  Pressure was optimized to 13cm.  The patient wore CPAP for one year and felt he was doing very well with the device, and then quit using it because his young family members changed the settings on the device.  He never followed up with anyone, and has been off CPAP since 2007.  The patient currently has been noted to have loud snoring, but no one has mentioned witnessed apneas during the night.  He has frequent awakenings at night, and intermittently has nonrestorative sleep.  He also notes intermittent sleep pressure during the day with periods of inactivity.  He has no issues with sleep and is watching TV prior to 8 PM.  He denies sleepiness with driving.  The patient's weight is stable from 2006, in his upper score today is 5.  Overall, the patient felt CPAP helped him greatly when he was wearing the device.  Sleep Questionnaire: What time do you typically go to bed?( Between what hours) 8-9pm How long does it take you to fall asleep? goes right to sleep How many times during the night do you wake up? 4 What time do you get out of bed to start your day? 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 (In pounds) 20 lb (9.072 kg) Have you ever had a sleep study before? Yes If yes, location of study? [facility] If yes, date of study? 2006 Do you currently use CPAP? No Do you wear oxygen at any time? No    Review of Systems  Constitutional: Negative for fever and unexpected weight change.  HENT: Positive for congestion. Negative for ear pain, nosebleeds, sore throat, rhinorrhea, sneezing, trouble swallowing, dental problem, postnasal drip and sinus pressure.    Eyes: Negative for redness and itching.  Respiratory: Positive for cough. Negative for chest tightness, shortness of breath and wheezing.   Cardiovascular: Negative for palpitations and leg swelling.  Gastrointestinal: Negative for nausea and vomiting.  Genitourinary: Negative for dysuria.  Musculoskeletal: Negative for joint swelling.  Skin: Negative for rash.  Neurological: Negative for headaches.  Hematological: Does not bruise/bleed easily.  Psychiatric/Behavioral: Negative for dysphoric mood. The patient is not nervous/anxious.        Objective:   Physical Exam Constitutional:  Overweight male, no acute distress  HENT:  Nares patent without discharge  Oropharynx without exudate, palate and uvula are thick and elongated.   Eyes:  Perrla, eomi, no scleral icterus  Neck:  No JVD, no TMG  Cardiovascular:  Normal rate, regular rhythm, no rubs or gallops.  No murmurs        Intact distal pulses  Pulmonary :  Normal breath sounds, no stridor or respiratory distress   No rales, rhonchi, or wheezing  Abdominal:  Soft, nondistended, bowel sounds present.  No tenderness noted.   Musculoskeletal:  Minimal lower extremity edema noted.  Lymph Nodes:  No cervical lymphadenopathy noted  Skin:  No cyanosis noted  Neurologic:  Alert, appropriate, moves all 4 extremities without obvious deficit.         Assessment & Plan:

## 2012-12-10 NOTE — Patient Instructions (Addendum)
Will get you restarted on cpap, and you will need a new mask and supplies.  Will get the equipment company to check the functioning of your machine.  Will use a loaner automatic device to help optimize your pressure. Work on weight loss followup with me in 8 weeks to check on your progress.

## 2012-12-10 NOTE — Assessment & Plan Note (Signed)
The patient has a history of mild to moderate sleep apnea, and has done well with CPAP in the past.  He has now been on the device for many years, and clearly continues to have issues with sleep apnea.  He is interested in getting back on the device, and we'll arrange this.  He also has underlying comorbid medical issues that can benefit from aggressive treatment of his sleep apnea.  We'll have the patient's device checked to make sure that it is in working order, and we'll get him the supplies and a mask.  We will also need to optimize his pressure again.  Finally, I have stressed to him the importance of aggressive weight loss.

## 2012-12-16 ENCOUNTER — Encounter: Payer: Self-pay | Admitting: Cardiovascular Disease

## 2013-01-03 ENCOUNTER — Other Ambulatory Visit: Payer: Medicare Other

## 2013-01-04 ENCOUNTER — Ambulatory Visit (INDEPENDENT_AMBULATORY_CARE_PROVIDER_SITE_OTHER): Payer: Medicare Other | Admitting: *Deleted

## 2013-01-04 DIAGNOSIS — I4891 Unspecified atrial fibrillation: Secondary | ICD-10-CM

## 2013-01-04 LAB — CBC WITH DIFFERENTIAL/PLATELET
Basophils Absolute: 0 10*3/uL (ref 0.0–0.1)
Basophils Relative: 0.4 % (ref 0.0–3.0)
Eosinophils Absolute: 0.3 10*3/uL (ref 0.0–0.7)
Eosinophils Relative: 4.1 % (ref 0.0–5.0)
HCT: 47.4 % (ref 39.0–52.0)
Hemoglobin: 16.5 g/dL (ref 13.0–17.0)
Lymphocytes Relative: 24.2 % (ref 12.0–46.0)
Lymphs Abs: 2 10*3/uL (ref 0.7–4.0)
MCHC: 34.9 g/dL (ref 30.0–36.0)
MCV: 88.7 fl (ref 78.0–100.0)
Monocytes Absolute: 0.9 10*3/uL (ref 0.1–1.0)
Monocytes Relative: 10.5 % (ref 3.0–12.0)
Neutro Abs: 5 10*3/uL (ref 1.4–7.7)
Neutrophils Relative %: 60.8 % (ref 43.0–77.0)
Platelets: 154 10*3/uL (ref 150.0–400.0)
RBC: 5.34 Mil/uL (ref 4.22–5.81)
RDW: 14.8 % — ABNORMAL HIGH (ref 11.5–14.6)
WBC: 8.2 10*3/uL (ref 4.5–10.5)

## 2013-01-04 LAB — BASIC METABOLIC PANEL
BUN: 23 mg/dL (ref 6–23)
CO2: 24 mEq/L (ref 19–32)
Calcium: 9 mg/dL (ref 8.4–10.5)
Chloride: 108 mEq/L (ref 96–112)
Creatinine, Ser: 1.5 mg/dL (ref 0.4–1.5)
GFR: 50.78 mL/min — ABNORMAL LOW (ref >60.00)
Glucose, Bld: 121 mg/dL — ABNORMAL HIGH (ref 70–99)
Potassium: 4.3 mEq/L (ref 3.5–5.1)
Sodium: 139 mEq/L (ref 135–145)

## 2013-01-04 LAB — PROTIME-INR
INR: 1.8 ratio — ABNORMAL HIGH (ref 0.8–1.0)
Prothrombin Time: 18.6 s — ABNORMAL HIGH (ref 10.2–12.4)

## 2013-01-04 LAB — APTT: aPTT: 33.9 s — ABNORMAL HIGH (ref 21.7–28.8)

## 2013-01-06 ENCOUNTER — Encounter: Payer: Self-pay | Admitting: Internal Medicine

## 2013-01-06 ENCOUNTER — Encounter: Payer: Self-pay | Admitting: Cardiovascular Disease

## 2013-01-06 NOTE — Telephone Encounter (Signed)
Follow up call   Returning call back to nurse  

## 2013-01-06 NOTE — Telephone Encounter (Signed)
I spoke with the pt and due to Dr Excell Seltzer not being in the cath lab on 01/10/13 (schedule change) he would like to reschedule to 01/17/13.  The pt's arrival time will remain the same.  I did give the pt instructions about Xarelto, Januvia and Metformin. The pt will call back if he has any questions.

## 2013-01-16 ENCOUNTER — Encounter: Payer: Self-pay | Admitting: Pulmonary Disease

## 2013-01-16 ENCOUNTER — Other Ambulatory Visit: Payer: Self-pay | Admitting: Pulmonary Disease

## 2013-01-16 DIAGNOSIS — G4733 Obstructive sleep apnea (adult) (pediatric): Secondary | ICD-10-CM

## 2013-01-17 ENCOUNTER — Encounter (HOSPITAL_BASED_OUTPATIENT_CLINIC_OR_DEPARTMENT_OTHER)
Admission: RE | Disposition: A | Discharge status: Home / Self Care | Payer: Self-pay | Source: Ambulatory Visit | Attending: Cardiovascular Disease

## 2013-01-17 ENCOUNTER — Inpatient Hospital Stay (HOSPITAL_BASED_OUTPATIENT_CLINIC_OR_DEPARTMENT_OTHER)
Admission: RE | Admit: 2013-01-17 | Discharge: 2013-01-17 | Disposition: A | Discharge status: Home / Self Care | Payer: Medicare Other | Source: Ambulatory Visit | Attending: Cardiovascular Disease | Admitting: Cardiovascular Disease

## 2013-01-17 DIAGNOSIS — Z9861 Coronary angioplasty status: Secondary | ICD-10-CM | POA: Insufficient documentation

## 2013-01-17 DIAGNOSIS — I4891 Unspecified atrial fibrillation: Secondary | ICD-10-CM | POA: Insufficient documentation

## 2013-01-17 DIAGNOSIS — I252 Old myocardial infarction: Secondary | ICD-10-CM | POA: Insufficient documentation

## 2013-01-17 DIAGNOSIS — I509 Heart failure, unspecified: Secondary | ICD-10-CM | POA: Insufficient documentation

## 2013-01-17 DIAGNOSIS — I2510 Atherosclerotic heart disease of native coronary artery without angina pectoris: Secondary | ICD-10-CM

## 2013-01-17 DIAGNOSIS — E119 Type 2 diabetes mellitus without complications: Secondary | ICD-10-CM | POA: Insufficient documentation

## 2013-01-17 HISTORY — PX: CARDIAC CATHETERIZATION: SHX172

## 2013-01-17 SURGERY — JV LEFT HEART CATHETERIZATION WITH CORONARY ANGIOGRAM
Anesthesia: Moderate Sedation

## 2013-01-17 MED ORDER — ONDANSETRON HCL 4 MG/2ML IJ SOLN: 4.0000 mg | Freq: Four times a day (QID) | INTRAMUSCULAR | Status: DC | PRN

## 2013-01-17 MED ORDER — ACETAMINOPHEN 325 MG PO TABS: 650.0000 mg | ORAL_TABLET | ORAL | Status: DC | PRN

## 2013-01-17 MED ORDER — SODIUM CHLORIDE 0.9 % IV SOLN: 1.0000 mL/kg/h | INTRAVENOUS | Status: DC

## 2013-01-17 NOTE — OR Nursing (Signed)
TR band removed and pressure dressing and splint applied, discharge instructions reviewed and signed, pt stated understanding, ambulated in hall without difficulty, site level 0, transported to wife's car via wheelchair

## 2013-01-17 NOTE — OR Nursing (Signed)
+  Allen's test right hand 

## 2013-01-17 NOTE — CV Procedure (Signed)
Cardiac Catheterization Procedure Note  Name: [name] MRN: [id number] DOB: Mar 18, 1941  Procedure: Left Heart Cath, Selective Coronary Angiography, LV angiography  Indication: Cardiomyopathy, known CAD. 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 He was treated with stenting of the right coronary artery. He was lost to followup for a few years but returned several months ago with congestive heart failure and atrial fibrillation. The patient underwent cardioversion but has quickly gone back into atrial fibrillation. He presents today for cardiac catheterization to rule out progressive coronary artery disease as a cause of his progressive LV dysfunction.   Procedural Details: The right wrist was prepped, draped, and anesthetized with 1% lidocaine. Using the modified Seldinger technique, a 5 French sheath was introduced into the right radial artery. 3 mg of verapamil was administered through the sheath, weight-based unfractionated heparin was administered intravenously. Standard Judkins catheters were used for selective coronary angiography and left ventriculography. Catheter exchanges were performed over an exchange length guidewire. There were no immediate procedural complications. A TR band was used for radial hemostasis at the completion of the procedure.  The patient was transferred to the post catheterization recovery area for further monitoring.  Procedural Findings: Hemodynamics: AO 105/72 with a mean of 87 LV 106/17  Coronary angiography: Coronary dominance: right  Left mainstem: The left main is patent. There is minimal irregularity with no significant stenosis. The left main divides into the LAD and left circumflex.  Left anterior descending (LAD): The LAD has diffuse proximal vessel disease. There is a hypodense eccentric plaque in the proximal LAD with associated 70% stenosis. The mid LAD has tandem  lesions of 75-80%. There are 3 small diagonal branches that arise from the diseased area of the proximal and mid LAD. Further down in the mid and distal LAD the vessel is small in caliber but has no significant stenosis. The diagonal branches are all patent with mild to moderate ostial stenosis. The LAD reaches the left ventricular apex.  Left circumflex (LCx): The left circumflex is patent throughout its course. There is some calcification and mild disease involving the ostium of the circumflex with approximately 30% stenosis. There is a moderate caliber obtuse marginal branch without significant stenosis. The AV groove circumflex beyond the first obtuse marginal is a small vessel.  Right coronary artery (RCA): The RCA is a large, dominant vessel. The stented segment in the proximal vessel is patent with mild diffuse in-stent restenosis. The mid vessel has tandem lesions estimated at 50%. The distal RCA is patent with diffuse irregularity. The PDA is patent. The posterolateral branch is large without significant stenosis.  Left ventriculography: There is severe left ventricular systolic dysfunction. The basal and midinferior wall are akinetic. There is severe diffuse hypokinesis, especially of the anterolateral wall. The apex is also hypokinetic. The estimated left ventricular ejection fraction is 30%.  Final Conclusions:   1. Severe proximal and mid LAD stenosis 2. Patent right coronary artery with moderate mid vessel stenosis 3. Mild nonobstructive left circumflex stenosis 4. Severe segmental left ventricular systolic dysfunction.  Recommendations: The patient has diabetes and severe LV dysfunction. I suspect atrial fibrillation is playing a significant role in his progressive LV dysfunction, but coronary artery disease and negative remodeling have likely impacted this as well. I think he should be considered for LIMA to LAD bypass and surgical maze procedure. I am going to refer him for a cardiac  surgery consultation.  [name] 01/17/2013, 9:50  AM

## 2013-01-17 NOTE — OR Nursing (Signed)
Bedrest began at 0915, Dr Excell Seltzer at bedside to discuss results and treatment plan with pt and family

## 2013-01-17 NOTE — H&P (Signed)
[name]  12/09/2012 1:15 PM   Office Visit  MRN:  [id number]   Description: 72 year old male  Provider: [name], MD  Department: [facility]        Referring Provider    Burton Apley, MD      Diagnoses    Coronary atherosclerosis of native coronary artery    -  Primary    414.01    Cardiomyopathy, ischemic        414.8      Reason for Visit    Appointment         Progress Notes    [name], MD at 12/16/2012  6:07 AM    Status: Signed                      HPI:    72 year old gentleman presenting for followup evaluation. The patient has coronary artery disease with history of inferior wall myocardial infarction in 2008. He was treated with bare-metal stenting of the RCA. He was noted to have moderate LAD stenosis. His left ventricular function was normal at the time of his MI. He has more recently been diagnosed with atrial fibrillation. The patient was started on anticoagulation and he underwent elective cardioversion December 31. He presents today for followup evaluation. He's also been found to have LV dysfunction with a left ventricular ejection fraction of 35-40% by recent echo. There is segmental wall motion abnormalities left anterolateral hypokinesis and severe hypokinesis of the inferoposterior wall.   The patient is doing well today. He has no symptoms. He specifically denies chest pain, dyspnea, palpitations, or leg swelling. After cardioversion his heart rate initially was in the 60s. He notes that beginning on January 5 his heart rate was up in the 80 to 90s with a little bit lower blood pressure. He continues to take Xarelto.    Outpatient Encounter Prescriptions as of 12/09/2012   Medication  Sig  Dispense  Refill   .  cetirizine (ZYRTEC) 10 MG tablet  Take 10 mg by mouth daily.          Marland Kitchen  ezetimibe (ZETIA) 10 MG tablet  Take 10 mg by mouth daily.         .  metFORMIN (GLUCOPHAGE) 500 MG tablet  Take 1/2 tab twice a day         .   metoprolol succinate (TOPROL-XL) 25 MG 24 hr tablet  Take 25 mg by mouth daily.         .  Rivaroxaban (XARELTO) 20 MG TABS  Take 1 tablet (20 mg total) by mouth daily.   30 tablet   6   .  sitaGLIPtin (JANUVIA) 100 MG tablet  Take 100 mg by mouth daily.         .  [DISCONTINUED] Blood Glucose Monitoring Suppl (FREESTYLE LITE) DEVI  by Does not apply route.         .  [DISCONTINUED] cephALEXin (KEFLEX) 500 MG capsule  Take 500 mg by mouth 4 (four) times daily.         .  [DISCONTINUED] cholecalciferol (VITAMIN D) 400 UNITS TABS  Take 5,000 Units by mouth daily.         .  [DISCONTINUED] ciprofloxacin (CIPRO) 250 MG tablet  Take 250 mg by mouth 2 (two) times daily.         .  [DISCONTINUED] co-enzyme Q-10 50 MG capsule  Take 100 mg by mouth daily.         .  [  DISCONTINUED] DHEA 10 MG CAPS  Take 400 mg by mouth daily.         .  [DISCONTINUED] glucosamine-chondroitin 500-400 MG tablet  Take 2 tablets by mouth daily.         .  [DISCONTINUED] metoprolol succinate (TOPROL-XL) 25 MG 24 hr tablet  Take 1 tablet (25 mg total) by mouth daily.   1 tablet   0   .  [DISCONTINUED] Multiple Vitamins-Minerals (MULTIVITAMIN WITH MINERALS) tablet  Take 1 tablet by mouth daily.         .  [DISCONTINUED] NON FORMULARY  Take 450 mg by mouth daily. Pro-pancreas         .  [DISCONTINUED] NON FORMULARY  Take by mouth daily. coffeegenic         .  [DISCONTINUED] NON FORMULARY  daily. alj         .  [DISCONTINUED] NON FORMULARY  Take 375 mg by mouth daily. Liver care         .  [DISCONTINUED] NON FORMULARY  Take 300 mcg by mouth daily. Chromium-gtf         .  [DISCONTINUED] NON FORMULARY  Take 780 mg by mouth daily. metabomax plus         .  [DISCONTINUED] NON FORMULARY  Take by mouth daily. Blood sugar formula         .  [DISCONTINUED] NON FORMULARY  Take 2,200 mcg by mouth daily. Super k2         .  [DISCONTINUED] Omega-3 Fatty Acids (SUPER OMEGA-3) 1000 MG CAPS  Take by mouth daily.         .  [DISCONTINUED] Red  Yeast Rice 600 MG CAPS  Take 600 mg by mouth daily.               Allergies   Allergen  Reactions   .  Asa Arthritis Strength-Antacid (Aspirin Buffered)         Bleeding tendencies    .  Metformin And Related         headaches   .  Statins         Aches          Past Medical History   Diagnosis  Date   .  Diabetes mellitus     .  Coronary artery disease         remote inferior MI; moderate LAD disease- managed medically   .  Hyperlipidemia     .  Allergy     .  Atrial fibrillation     .  Bronchitis     .  Sleep apnea         Dx sleep anea does not use C-PAP        ROS: Negative except as per HPI   BP 125/78  Pulse 92  Ht 5\' 11"  (1.803 m)  Wt 109.317 kg (241 lb)  BMI 33.61 kg/m2   PHYSICAL EXAM: Pt is alert and oriented, NAD HEENT: normal Neck: JVP - normal, carotids 2+= without bruits Lungs: CTA bilaterally CV: Irregularly irregular without murmur or gallop Abd: soft, NT, Positive BS Ext: no C/C/E, distal pulses intact and equal Skin: warm/dry no rash   EKG:  Atrial fibrillation 92 beats per minute, age-indeterminate inferior infarction.   2-D echo 10/26/2012: Study Conclusions  - Left ventricle: The cavity size was normal. Wall thickness was increased in a pattern of mild LVH. Indeterminant diastolic function (atrial fibrillation). Systolic function was moderately  reduced. The estimated ejection fraction was in the range of 35% to 40%. Posterior akinesis. Basal to mid anterolateral and basal to mid inferior severe hypokinesis. - Aortic valve: There was no stenosis. - Mitral valve: Trivial regurgitation. - Left atrium: The atrium was moderately dilated. - Right ventricle: The cavity size was normal. Systolic function was normal. - Right atrium: The atrium was mildly to moderately dilated. - Pulmonary arteries: No complete TR doppler jet, so unable to estimate PA systolic pressure. - Inferior vena cava: The vessel was normal in size;  the respirophasic diameter changes were in the normal range (= 50%); findings are consistent with normal central venous pressure. Impressions:  - The patient was in atrial fibrillation. Normal LV size with mild LV hypertrophy. EF 35-40% with posterior akinesis and basal to mid inferior and anterolateral severe hypokinesis. Normal RV size and systolic function.   ASSESSMENT AND PLAN: 1. Atrial fibrillation. Unfortunately the patient has early recurrence of atrial fib after elective cardioversion. He is asymptomatic so for now will continue with rate control and anticoagulation. I'm concerned about his LV dysfunction. Will proceed with cardiac catheterization to evaluate his LV dysfunction and make further recommendations regarding his atrial fib after that time. I think his only antiarrhythmic option will be Tikosyn because of CAD and LV dysfunction. We may continue with rate-control and anticoagulation since he is tolerating AF fairly well. I also advised him to get back in to see Dr Maple Hudson with [facility] as obstructive sleep apnea may be driving his AFib. See discussion below.   2. Coronary artery disease with history of inferior MI. The patient has progressive LV dysfunction by echo. Progressive CAD needs to be excluded. He had moderate LAD stenosis at the time of his MI. I have reviewed the risks, indications, and alternatives to cardiac catheterization with the patient. We will need to wait until he has had a full 30 days of anticoagulation after his cardioversion. We'll plan on proceeding with cardiac cath in February.   3. A cardiomyopathy. The patient appears stable on long-acting metoprolol. Some of his home blood pressure readings are very low with systolics in the 90s. Will hold off on an ACE or ARB because of low blood pressure.   [name] 12/09/2012 2:02 PM     [name] 12/09/2012 2:01 PM     The patient is well-known to me. He was scheduled for cardiac  catheterization last week, but this had to be rescheduled because I was out of town unexpectedly. There are no interval changes in his history or physical examination. He was reevaluated this morning. I have again discussed the risks, indications, and alternatives to cardiac catheterization with the patient and his family (wife and son). They understand and agree to proceed.  [name] 01/17/2013 8:56 AM

## 2013-01-17 NOTE — OR Nursing (Signed)
Dr Cooper at bedside to discuss results and treatment plan with pt and family 

## 2013-01-18 LAB — POCT I-STAT GLUCOSE
Glucose, Bld: 124 mg/dL — ABNORMAL HIGH (ref 70–99)
Operator id: 141321

## 2013-01-19 ENCOUNTER — Institutional Professional Consult (permissible substitution) (INDEPENDENT_AMBULATORY_CARE_PROVIDER_SITE_OTHER): Payer: Medicare Other | Admitting: Thoracic Surgery (Cardiothoracic Vascular Surgery)

## 2013-01-19 ENCOUNTER — Encounter: Payer: Medicare Other | Admitting: Thoracic Surgery (Cardiothoracic Vascular Surgery)

## 2013-01-19 ENCOUNTER — Encounter: Payer: Self-pay | Admitting: Thoracic Surgery (Cardiothoracic Vascular Surgery)

## 2013-01-19 ENCOUNTER — Other Ambulatory Visit: Payer: Self-pay | Admitting: *Deleted

## 2013-01-19 ENCOUNTER — Encounter (HOSPITAL_COMMUNITY): Payer: Self-pay | Admitting: Pharmacy Technician

## 2013-01-19 VITALS — BP 126/83 | HR 45 | Resp 20 | Ht 71.0 in | Wt 234.0 lb

## 2013-01-19 DIAGNOSIS — I2589 Other forms of chronic ischemic heart disease: Secondary | ICD-10-CM

## 2013-01-19 DIAGNOSIS — E785 Hyperlipidemia, unspecified: Secondary | ICD-10-CM | POA: Insufficient documentation

## 2013-01-19 DIAGNOSIS — E119 Type 2 diabetes mellitus without complications: Secondary | ICD-10-CM

## 2013-01-19 DIAGNOSIS — I4891 Unspecified atrial fibrillation: Secondary | ICD-10-CM

## 2013-01-19 DIAGNOSIS — I2510 Atherosclerotic heart disease of native coronary artery without angina pectoris: Secondary | ICD-10-CM

## 2013-01-19 DIAGNOSIS — I255 Ischemic cardiomyopathy: Secondary | ICD-10-CM

## 2013-01-19 DIAGNOSIS — I10 Essential (primary) hypertension: Secondary | ICD-10-CM | POA: Insufficient documentation

## 2013-01-19 DIAGNOSIS — E1129 Type 2 diabetes mellitus with other diabetic kidney complication: Secondary | ICD-10-CM | POA: Insufficient documentation

## 2013-01-19 DIAGNOSIS — E669 Obesity, unspecified: Secondary | ICD-10-CM

## 2013-01-19 NOTE — Progress Notes (Signed)
[address]            [name] [id number]          [phone]     CARDIOTHORACIC SURGERY CONSULTATION REPORT  Referring Provider is Richard Bollman, MD PCP is ROBERTS, Vernie Ammons, MD  Chief Complaint  Patient presents with  . Coronary Artery Disease    Referral from Dr [name] for surgical eval for CABG/MAZE, ECHO 10/26/12, Cardiac Cath 01/17/13  . Atrial Fibrillation    HPI:  Patient is a 72 year old obese white male with history of coronary artery disease with ischemic cardiomyopathy, persistent atrial fibrillation, type 2 diabetes mellitus, hyperlipidemia, and obstructive sleep apnea.  In 2008 the patient presented with an acute inferior wall myocardial infarction. He was treated with PCI and stenting of the right coronary artery using a bare-metal stent. He has been followed intermittently by Dr. Excell Suarez ever since. This past November he presented for follow up noting that his pulse rate was elevated and irregular.  He was seen by [name] who noted that he was in atrial fibrillation.  His beta blocker was increased for rate control and he was started on Xarelto.  An echocardiogram was performed demonstrating ejection fraction 35-40%.  The left atrium was enlarged but there were no significant valvular abnormalities, but there were segmental wall motion abnormalities including left anterolateral hypokinesis and severe hypokinesis of the inferoposterior wall.  He underwent DC cardioversion on 11/30/2012. Unfortunately he went back into atrial fibrillation by the time he was seen in follow up on 12/09/2012.  Because of his history of two-vessel coronary artery disease, followup catheterization was performed 01/17/2013.  This revealed significant progression of disease in the left anterior descending coronary artery with moderate right coronary artery stenosis. The patient was referred for surgical consultation to consider coronary artery bypass grafting  with Maze procedure.  The patient describes relatively mild symptoms of exertional shortness of breath and occasional mild exertional chest tightness. The patient has remained fairly active physically and up until last week had still been going to the gym to exercise on a regular basis. He has noticed that his exercise tolerance is decreased, his energy level is decreased, and he does get short of breath with moderate activity. He denies any resting shortness of breath, PND, orthopnea, or lower extremity edema. She's had occasional mild episodes of tightness across his chest but this is only occur with strenuous physical activity. He is aware that his pulse is irregular but he denies palpitations. He has not had dizzy spells nor syncope.  Past Medical History  Diagnosis Date  . Coronary artery disease   . Hyperlipidemia   . Allergy   . Atrial fibrillation     persistent, failed DCCV  . Bronchitis   . Sleep apnea   . Cardiomyopathy, ischemic   . Type II diabetes mellitus   . Obesity (BMI 30-39.9)   . 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 Hypertension     Past Surgical History  Procedure Laterality Date  . Tonsillectomy  1952  . Coronary angioplasty with stent placement    . Nasal septum surgery  2004    and sinus repair penta  . Umbilical hernia repair  01/2010  . Hernia repair  12/18/2009    Incarcerated umbilical hernia  . Cardioversion  11/30/2012    Procedure: CARDIOVERSION;  Surgeon: Richard Bollman, MD;  Location: [facility] ENDOSCOPY;  Service: Cardiovascular;  Laterality: [name];    Family History  Problem Relation Age of Onset  . Heart disease Mother   . Diabetes Father   . Lung disease Father   . Cancer Father     colon    History   Social History  . Marital Status: Married    Spouse Name: [name]    Number of Children: [name]  . Years of Education: [name]   Occupational History  . Not on file.   Social History Main Topics  . Smoking status:  Former Smoker    Types: Cigarettes, Pipe    Quit date: 01/20/1979  . Smokeless tobacco: Never Used     Comment: mostly smoke pipes  . Alcohol Use: Yes     Comment: rare  . Drug Use: No  . Sexually Active: Not on file   Other Topics Concern  . Not on file   Social History Narrative  . No narrative on file    Current Outpatient Prescriptions  Medication Sig Dispense Refill  . cetirizine (ZYRTEC) 10 MG tablet Take 10 mg by mouth daily.       Marland Kitchen ezetimibe (ZETIA) 10 MG tablet Take 10 mg by mouth daily.      . metFORMIN (GLUCOPHAGE) 500 MG tablet Take 1/2 tab twice a day      . metoprolol succinate (TOPROL-XL) 25 MG 24 hr tablet Take 25 mg by mouth daily.      . Rivaroxaban (XARELTO) 20 MG TABS Take 1 tablet (20 mg total) by mouth daily.  30 tablet  6  . sitaGLIPtin (JANUVIA) 100 MG tablet Take 100 mg by mouth daily.       No current facility-administered medications for this visit.    Allergies  Allergen Reactions  . Asa Arthritis Strength-Antacid (Aspirin Buffered)     Bleeding tendencies   . Metformin And Related     headaches  . Statins     Aches       Review of Systems:   General:  normal appetite, decreased energy, + weight gain, no weight loss, no fever  Cardiac:  + mild chest pain but only with strenuous exertion, no chest pain at rest, + SOB with moderate exertion, no resting SOB, no PND, no orthopnea, no palpitations, + arrhythmia, + atrial fibrillation, no LE edema, no dizzy spells, no syncope  Respiratory:  + exertional shortness of breath, no home oxygen, no productive cough, no dry cough, recent episode bronchitis 4-5 weeks ago, no wheezing, no hemoptysis, no asthma, no pain with inspiration or cough, + sleep apnea, + CPAP at night  GI:   occasional difficulty swallowing, + mild reflux, no frequent heartburn, no hiatal hernia, no abdominal pain, no constipation, no diarrhea, no hematochezia, no hematemesis, no melena  GU:   no dysuria,  no frequency, no  urinary tract infection, no hematuria, no enlarged prostate, no kidney stones, no kidney disease  Vascular:  no pain suggestive of claudication, no pain in feet, no leg cramps, no varicose veins, no DVT, no non-healing foot ulcer  Neuro:   no stroke, no TIA's, no seizures, no headaches, no temporary blindness one eye,  no slurred speech, no peripheral neuropathy, no chronic pain, no instability of gait, no memory/cognitive dysfunction  Musculoskeletal: + 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, no joint swelling, no myalgias, no difficulty walking, normal mobility   Skin:   no rash, no itching, no skin infections, no pressure sores or  ulcerations  Psych:   no anxiety, no depression, no nervousness, no unusual recent stress  Eyes:   occasional blurry vision, no floaters, no recent vision changes, + wears glasses or contacts  ENT:   no hearing loss, no loose or painful teeth, no dentures  Hematologic:  no easy bruising, no abnormal bleeding, noclotting disorder, no frequent epistaxis  Endocrine:  + diabetes, occasionally checks CBG's at home     Physical Exam:   BP 126/83  Pulse 45  Resp 20  Ht 5\' 11"  (1.803 m)  Wt 106.142 kg (234 lb)  BMI 32.65 kg/m2  SpO2 98%  General:  Obese but o/w well-appearing  HEENT:  Unremarkable   Neck:   no JVD, no bruits, no adenopathy   Chest:   clear to auscultation, symmetrical breath sounds, no wheezes, no rhonchi   CV:   Irregular rate and rhythm, no murmur   Abdomen:  soft, non-tender, no masses   Extremities:  warm, well-perfused, pulses palpable, no LE edema  Rectal/GU  Deferred  Neuro:   Grossly non-focal and symmetrical throughout  Skin:   Clean and dry, no rashes, no breakdown   Diagnostic Tests:  ------------------------------------------------------------ Transthoracic Echocardiography  Patient: [name], [name] MR #: [id number] Study Date: 10/26/2012 Gender: M Age: 51 Height: 180.3cm Weight: 110.7kg BSA: 2.63m^2 Pt.  Status: Room:  REFERRING [name], Loman Chroman SONOGRAPHER Aida Raider, RDCS PERFORMING [facility] ORDERING Tyrone Sage, Lawson Fiscal Jacobo Forest, Lawson Fiscal cc:  ------------------------------------------------------------ LV EF: 35% - 40%  ------------------------------------------------------------ Indications: Atrial fibrillation - 427.31.  ------------------------------------------------------------ History: PMH: Acquired from the patient and from the patient's chart. PMH: Inferior Mi. CAD. Risk factors: Hypertension. Diabetes mellitus. Obese. Dyslipidemia.  ------------------------------------------------------------ Study Conclusions  - Left ventricle: The cavity size was normal. Wall thickness was increased in a pattern of mild LVH. Indeterminant diastolic function (atrial fibrillation). Systolic function was moderately reduced. The estimated ejection fraction was in the range of 35% to 40%. Posterior akinesis. Basal to mid anterolateral and basal to mid inferior severe hypokinesis. - Aortic valve: There was no stenosis. - Mitral valve: Trivial regurgitation. - Left atrium: The atrium was moderately dilated. - Right ventricle: The cavity size was normal. Systolic function was normal. - Right atrium: The atrium was mildly to moderately dilated. - Pulmonary arteries: No complete TR doppler jet, so unable to estimate PA systolic pressure. - Inferior vena cava: The vessel was normal in size; the respirophasic diameter changes were in the normal range (= 50%); findings are consistent with normal central venous pressure. Impressions:  - The patient was in atrial fibrillation. Normal LV size with mild LV hypertrophy. EF 35-40% with posterior akinesis and basal to mid inferior and anterolateral severe hypokinesis. Normal RV size and systolic function. Transthoracic echocardiography. M-mode, complete 2D, spectral Doppler, and color Doppler.  Height: Height: 180.3cm. Height: 71in. Weight: Weight: 110.7kg. Weight: 243.5lb. Body mass index: BMI: 34kg/m^2. Body surface area: BSA: 2.90m^2. Blood pressure: 140/100. Patient status: Outpatient. Location: [facility]  ------------------------------------------------------------  ------------------------------------------------------------ Left ventricle: The cavity size was normal. Wall thickness was increased in a pattern of mild LVH. Indeterminant diastolic function (atrial fibrillation). Systolic function was moderately reduced. The estimated ejection fraction was in the range of 35% to 40%. Posterior akinesis. Basal to mid anterolateral and basal to mid inferior severe hypokinesis.  ------------------------------------------------------------ Aortic valve: Trileaflet; mildly calcified leaflets. Doppler: There was no stenosis. No regurgitation.  ------------------------------------------------------------ Aorta: Aortic root: The aortic root was normal in size. Ascending aorta: The ascending  aorta was normal in size.  ------------------------------------------------------------ Mitral valve: Doppler: There was no evidence for stenosis. Trivial regurgitation. Peak gradient: 3mm Hg (D).  ------------------------------------------------------------ Left atrium: The atrium was moderately dilated.  ------------------------------------------------------------ Right ventricle: The cavity size was normal. Systolic function was normal.  ------------------------------------------------------------ Pulmonic valve: Structurally normal valve. Cusp separation was normal. Doppler: Transvalvular velocity was within the normal range. No regurgitation.  ------------------------------------------------------------ Tricuspid valve: Doppler: No significant regurgitation.  ------------------------------------------------------------ Pulmonary artery: No complete TR doppler jet, so  unable to estimate PA systolic pressure.  ------------------------------------------------------------ Right atrium: The atrium was mildly to moderately dilated.  ------------------------------------------------------------ Pericardium: There was no pericardial effusion.  ------------------------------------------------------------ Systemic veins: Inferior vena cava: The vessel was normal in size; the respirophasic diameter changes were in the normal range (= 50%); findings are consistent with normal central venous pressure.  ------------------------------------------------------------  2D measurements Normal Doppler measurements Normal Left ventricle Left ventricle LVID ED, 41.4 mm 43-52 Ea, lat 13.5 cm/s ------ chord, ann, tiss PLAX DP LVID ES, 31.2 mm 23-38 E/Ea, lat 6.58 ------ chord, ann, tiss PLAX DP FS, chord, 25 % >29 Ea, med 7.14 cm/s ------ PLAX ann, tiss LVPW, ED 12.4 mm ------ DP Ventricular septum E/Ea, med 12.44 ------ IVS, ED 12.4 mm ------ ann, tiss LVOT DP Diam, S 23 mm ------ LVOT Area 4.15 cm^2 ------ Peak vel, 88.5 cm/s ------ Diam 23 mm ------ S Aorta VTI, S 16.1 cm ------ Root diam, 37 mm ------ Stroke vol 66.9 ml ------ ED Stroke 29.2 ml/m^ ------ Left atrium index 2 AP dim 49 mm ------ Mitral valve AP dim 2.14 cm/m^2 <2.2 Peak E vel 88.8 cm/s ------ index Decelerati 144 ms 150-23 on time 0 Peak 3 mm Hg ------ gradient, D Right ventricle Sa vel, 13.8 cm/s ------ lat ann, tiss DP  ------------------------------------------------------------ Prepared and Electronically Authenticated by  Marca Ancona 2013-11-26T21:06:[id number]  Cardiac Catheterization Procedure Note  Name: [name]  MRN: [id number]  DOB: 06-01-1941  Procedure: Left Heart Cath, Selective Coronary Angiography, LV angiography  Indication: Cardiomyopathy, known CAD. This is a 72 year old gentleman with coronary artery disease who initially presented in 2008 with an  inferior wall MI. He was treated with stenting of the right coronary artery. He was lost to followup for a few years but returned several months ago with congestive heart failure and atrial fibrillation. The patient underwent cardioversion but has quickly gone back into atrial fibrillation. He presents today for cardiac catheterization to rule out progressive coronary artery disease as a cause of his progressive LV dysfunction.  Procedural Details: The right wrist was prepped, draped, and anesthetized with 1% lidocaine. Using the modified Seldinger technique, a 5 French sheath was introduced into the right radial artery. 3 mg of verapamil was administered through the sheath, weight-based unfractionated heparin was administered intravenously. Standard Judkins catheters were used for selective coronary angiography and left ventriculography. Catheter exchanges were performed over an exchange length guidewire. There were no immediate procedural complications. A TR band was used for radial hemostasis at the completion of the procedure. The patient was transferred to the post catheterization recovery area for further monitoring.  Procedural Findings:  Hemodynamics:  AO 105/72 with a mean of 87  LV 106/17  Coronary angiography:  Coronary dominance: right  Left mainstem: The left main is patent. There is minimal irregularity with no significant stenosis. The left main divides into the LAD and left circumflex.  Left anterior descending (LAD): The LAD has diffuse proximal vessel disease. There is a hypodense eccentric plaque in the  proximal LAD with associated 70% stenosis. The mid LAD has tandem lesions of 75-80%. There are 3 small diagonal branches that arise from the diseased area of the proximal and mid LAD. Further down in the mid and distal LAD the vessel is small in caliber but has no significant stenosis. The diagonal branches are all patent with mild to moderate ostial stenosis. The LAD reaches the left  ventricular apex.  Left circumflex (LCx): The left circumflex is patent throughout its course. There is some calcification and mild disease involving the ostium of the circumflex with approximately 30% stenosis. There is a moderate caliber obtuse marginal branch without significant stenosis. The AV groove circumflex beyond the first obtuse marginal is a small vessel.  Right coronary artery (RCA): The RCA is a large, dominant vessel. The stented segment in the proximal vessel is patent with mild diffuse in-stent restenosis. The mid vessel has tandem lesions estimated at 50%. The distal RCA is patent with diffuse irregularity. The PDA is patent. The posterolateral branch is large without significant stenosis.  Left ventriculography: There is severe left ventricular systolic dysfunction. The basal and midinferior wall are akinetic. There is severe diffuse hypokinesis, especially of the anterolateral wall. The apex is also hypokinetic. The estimated left ventricular ejection fraction is 30%.  Final Conclusions:  1. Severe proximal and mid LAD stenosis  2. Patent right coronary artery with moderate mid vessel stenosis  3. Mild nonobstructive left circumflex stenosis  4. Severe segmental left ventricular systolic dysfunction.  Recommendations: The patient has diabetes and severe LV dysfunction. I suspect atrial fibrillation is playing a significant role in his progressive LV dysfunction, but coronary artery disease and negative remodeling have likely impacted this as well. I think he should be considered for LIMA to LAD bypass and surgical maze procedure. I am going to refer him for a cardiac surgery consultation.  [name]  01/17/2013, 9:50 AM     Impression:  Severe two-vessel coronary artery disease with ischemic cardiomyopathy and moderate left ventricular dysfunction. There is high-grade stenosis of the left anterior descending coronary artery and moderate disease in the previously stented right  coronary artery with history of inferior wall myocardial infarction in 2008. The patient has recently developed persistent atrial fibrillation which failed an attempted DC cardioversion. The patient has fairly mild and relatively stable symptoms of exertional shortness of breath. I agree that he would probably best be treated with coronary artery bypass grafting and Maze procedure as most definitive means to treat all of his cardiac problems with associated decrease risk of future cardiac event and prolong long-term survival. Options include PCI and stenting of the left anterior descending coronary artery with or without RF ablation for atrial fibrillation versus medical therapy alone without any intervention at this time.   Plan:  I've discussed options at length with the patient and his wife in the office today. We discussed the indications, risks, and potential benefits of coronary artery bypass grafting and Maze procedure. They understand and accept all potential associated risks of surgery including but not limited to risk of death, stroke, myocardial infarction, congestive heart failure, respiratory failure, renal failure, pneumonia, bleeding requiring blood transfusion, arrhythmia including late recurrence of atrial fibrillation, heart block or bradycardia requiring permanent pacemaker placement, pulmonary embolus, and late recurrence of symptomatic coronary artery disease.  All of their questions been addressed. We tentatively plan to proceed with surgery on February 27. We will begin the patient on amiodarone prior to surgery. The patient will stop taking Xarelto  this weekend in anticipation of surgery next week.    Salvatore Decent. Cornelius Moras, MD 01/19/2013 12:05 PM

## 2013-01-19 NOTE — Patient Instructions (Signed)
Start taking amiodarone tomorrow.  Stop taking Xarelto (blood thinner) after taking your dose on Saturday (2/22).  Continue all other medications through the day before surgery.  Take only metoprolol on the morning of surgery with a sip of water.

## 2013-01-20 MED ORDER — AMIODARONE HCL 200 MG PO TABS: 200.0000 mg | ORAL_TABLET | Freq: Two times a day (BID) | ORAL | Status: DC

## 2013-01-20 NOTE — Addendum Note (Signed)
Addended by: Tressie Stalker MD H on: 01/20/2013 11:28 AM   Modules accepted: Orders

## 2013-01-20 NOTE — Progress Notes (Signed)
preop amiodarone ordered

## 2013-01-21 ENCOUNTER — Other Ambulatory Visit: Payer: Self-pay | Admitting: Pulmonary Disease

## 2013-01-21 DIAGNOSIS — G4733 Obstructive sleep apnea (adult) (pediatric): Secondary | ICD-10-CM

## 2013-01-24 DIAGNOSIS — Z01810 Encounter for preprocedural cardiovascular examination: Secondary | ICD-10-CM

## 2013-01-25 ENCOUNTER — Encounter (HOSPITAL_COMMUNITY)
Admission: RE | Admit: 2013-01-25 | Discharge: 2013-01-25 | Disposition: A | Discharge status: Home / Self Care | Payer: Medicare Other | Source: Ambulatory Visit | Attending: Thoracic Surgery (Cardiothoracic Vascular Surgery) | Admitting: Thoracic Surgery (Cardiothoracic Vascular Surgery)

## 2013-01-25 ENCOUNTER — Ambulatory Visit (HOSPITAL_COMMUNITY)
Admission: RE | Admit: 2013-01-25 | Discharge: 2013-01-25 | Disposition: A | Discharge status: Home / Self Care | Payer: Medicare Other | Source: Ambulatory Visit | Attending: Thoracic Surgery (Cardiothoracic Vascular Surgery) | Admitting: Thoracic Surgery (Cardiothoracic Vascular Surgery)

## 2013-01-25 ENCOUNTER — Telehealth: Payer: Self-pay | Admitting: *Deleted

## 2013-01-25 ENCOUNTER — Encounter (HOSPITAL_COMMUNITY): Payer: Self-pay

## 2013-01-25 VITALS — BP 111/70 | HR 62 | Temp 97.8°F | Resp 20 | Ht 69.0 in | Wt 242.0 lb

## 2013-01-25 DIAGNOSIS — Z01818 Encounter for other preprocedural examination: Secondary | ICD-10-CM | POA: Insufficient documentation

## 2013-01-25 DIAGNOSIS — I517 Cardiomegaly: Secondary | ICD-10-CM | POA: Insufficient documentation

## 2013-01-25 DIAGNOSIS — I4891 Unspecified atrial fibrillation: Secondary | ICD-10-CM

## 2013-01-25 DIAGNOSIS — I2510 Atherosclerotic heart disease of native coronary artery without angina pectoris: Secondary | ICD-10-CM

## 2013-01-25 DIAGNOSIS — Z01812 Encounter for preprocedural laboratory examination: Secondary | ICD-10-CM | POA: Insufficient documentation

## 2013-01-25 DIAGNOSIS — Z01811 Encounter for preprocedural respiratory examination: Secondary | ICD-10-CM | POA: Insufficient documentation

## 2013-01-25 DIAGNOSIS — R9431 Abnormal electrocardiogram [ECG] [EKG]: Secondary | ICD-10-CM | POA: Insufficient documentation

## 2013-01-25 HISTORY — DX: Personal history of urinary calculi: Z87.442

## 2013-01-25 HISTORY — DX: Other complications of anesthesia, initial encounter: T88.59XA

## 2013-01-25 HISTORY — DX: Adverse effect of unspecified anesthetic, initial encounter: T41.45XA

## 2013-01-25 HISTORY — DX: Allergic rhinitis due to pollen: J30.1

## 2013-01-25 LAB — SURGICAL PCR SCREEN
MRSA, PCR: NEGATIVE
Staphylococcus aureus: POSITIVE — AB

## 2013-01-25 LAB — COMPREHENSIVE METABOLIC PANEL
ALT: 25 U/L (ref 0–53)
AST: 21 U/L (ref 0–37)
Albumin: 3.8 g/dL (ref 3.5–5.2)
Alkaline Phosphatase: 53 U/L (ref 39–117)
BUN: 17 mg/dL (ref 6–23)
CO2: 24 mEq/L (ref 19–32)
Calcium: 9.4 mg/dL (ref 8.4–10.5)
Chloride: 101 mEq/L (ref 96–112)
Creatinine, Ser: 1.28 mg/dL (ref 0.50–1.35)
GFR calc Af Amer: 64 mL/min — ABNORMAL LOW (ref >90)
GFR calc non Af Amer: 55 mL/min — ABNORMAL LOW (ref >90)
Glucose, Bld: 118 mg/dL — ABNORMAL HIGH (ref 70–99)
Potassium: 3.9 mEq/L (ref 3.5–5.1)
Sodium: 137 mEq/L (ref 135–145)
Total Bilirubin: 0.5 mg/dL (ref 0.3–1.2)
Total Protein: 7.2 g/dL (ref 6.0–8.3)

## 2013-01-25 LAB — TYPE AND SCREEN
ABO/RH(D): A POS
Antibody Screen: NEGATIVE

## 2013-01-25 LAB — URINALYSIS, ROUTINE W REFLEX MICROSCOPIC
Bilirubin Urine: NEGATIVE
Glucose, UA: NEGATIVE mg/dL
Hgb urine dipstick: NEGATIVE
Ketones, ur: NEGATIVE mg/dL
Leukocytes, UA: NEGATIVE
Nitrite: NEGATIVE
Protein, ur: NEGATIVE mg/dL
Specific Gravity, Urine: 1.012 (ref 1.005–1.030)
Urobilinogen, UA: 0.2 mg/dL (ref 0.0–1.0)
pH: 6.5 (ref 5.0–8.0)

## 2013-01-25 LAB — PULMONARY FUNCTION TEST

## 2013-01-25 LAB — CBC
HCT: 46.9 % (ref 39.0–52.0)
Hemoglobin: 16.7 g/dL (ref 13.0–17.0)
MCH: 31.6 pg (ref 26.0–34.0)
MCHC: 35.6 g/dL (ref 30.0–36.0)
MCV: 88.7 fL (ref 78.0–100.0)
Platelets: 147 10*3/uL — ABNORMAL LOW (ref 150–400)
RBC: 5.29 MIL/uL (ref 4.22–5.81)
RDW: 13.5 % (ref 11.5–15.5)
WBC: 10.3 10*3/uL (ref 4.0–10.5)

## 2013-01-25 LAB — PROTIME-INR
INR: 0.98 (ref 0.00–1.49)
Prothrombin Time: 12.9 seconds (ref 11.6–15.2)

## 2013-01-25 LAB — APTT: aPTT: 26 seconds (ref 24–37)

## 2013-01-25 LAB — ABO/RH: ABO/RH(D): A POS

## 2013-01-25 MED ORDER — CHLORHEXIDINE GLUCONATE 4 % EX LIQD: 30.0000 mL | CUTANEOUS | Status: DC

## 2013-01-25 NOTE — Telephone Encounter (Signed)
Completed patients heart surgery education with patient in short stay.  Book given.  All questions answered.  Told to call office if any other concerns come up.

## 2013-01-25 NOTE — Progress Notes (Signed)
VASCULAR LAB PRELIMINARY  PRELIMINARY  PRELIMINARY  PRELIMINARY  Pre-op Cardiac Surgery  Carotid Findings: No significant ICA stenosis noted bilaterally.  Vertebral artery flow antegrade.   Upper Extremity Right Left  Brachial Pressures 110 112  Radial Waveforms triphasic  triphasic  Ulnar Waveforms triphasic triphasic  Palmar Arch (Allen's Test) Normal with radial and ulnar compression Normal with radial and ulnar compression     Findings:    Palpable pedal pulses X 4  Lower  Extremity Right Left  Dorsalis Pedis    Anterior Tibial    Posterior Tibial    Ankle/Brachial Indices      Findings:     [name], RVT 01/25/2013, 7:15 PM

## 2013-01-25 NOTE — Progress Notes (Addendum)
Dr.Michael Excell Seltzer is Cardiologist with [name]  Echo reports in epic from 2008 and 2013 Stress test in epic from 2008 Heart cath report in epic 01-17-13   Medical MD ise Dr.Ronald Su Hilt    Confirmed Dopplers were done at 1200 today Confirmed PFT's were done at 1300

## 2013-01-25 NOTE — Progress Notes (Signed)
Sleep study done at least 10yrs ago 

## 2013-01-25 NOTE — Pre-Procedure Instructions (Signed)
[name]  01/25/2013   Your procedure is scheduled on:  Thurs, Feb 27 @ 7:30 AM  Report to [facility] at 5:30 AM.  Call this number if you have problems the morning of surgery: [phone]   Remember:   Do not eat food or drink liquids after midnight.   Take these medicines the morning of surgery with A SIP OF WATER: Amiodarone(Pacerone),Cetirizine(Zyrtec),and Metoprolol(Toprol)   Do not wear jewelry  Do not wear lotions, powders, or colognes. You may wear deodorant.  Men may shave face and neck.  Do not bring valuables to the hospital.  Contacts, dentures or bridgework may not be worn into surgery.  Leave suitcase in the car. After surgery it may be brought to your room.  For patients admitted to the hospital, checkout time is 11:00 AM the day of  discharge.   Patients discharged the day of surgery will not be allowed to drive  home.    Special Instructions: Shower using CHG 2 nights before surgery and the night before surgery.  If you shower the day of surgery use CHG.  Use special wash - you have one bottle of CHG for all showers.  You should use approximately 1/3 of the bottle for each shower.   Please read over the following fact sheets that you were given: Pain Booklet, Coughing and Deep Breathing, Blood Transfusion Information, Open Heart Packet, MRSA Information and Surgical Site Infection Prevention

## 2013-01-26 LAB — POCT I-STAT 3, ART BLOOD GAS (G3+)
Acid-Base Excess: 3 mmol/L — ABNORMAL HIGH (ref 0.0–2.0)
Bicarbonate: 25.3 mEq/L — ABNORMAL HIGH (ref 20.0–24.0)
O2 Saturation: 96 %
Patient temperature: 98.6
TCO2: 26 mmol/L (ref 0–100)
pCO2 arterial: 32.8 mmHg — ABNORMAL LOW (ref 35.0–45.0)
pH, Arterial: 7.495 — ABNORMAL HIGH (ref 7.350–7.450)
pO2, Arterial: 73 mmHg — ABNORMAL LOW (ref 80.0–100.0)

## 2013-01-26 LAB — HEMOGLOBIN A1C
Hgb A1c MFr Bld: 6.9 % — ABNORMAL HIGH (ref <5.7)
Mean Plasma Glucose: 151 mg/dL — ABNORMAL HIGH (ref <117)

## 2013-01-26 MED ORDER — METOPROLOL TARTRATE 12.5 MG HALF TABLET: 12.5000 mg | ORAL_TABLET | Freq: Once | ORAL | Status: DC

## 2013-01-26 MED ORDER — SODIUM CHLORIDE 0.9 % IV SOLN
INTRAVENOUS | Status: AC
  Administered 2013-01-27: 70 mL/h via INTRAVENOUS
  Filled 2013-01-26: qty 40

## 2013-01-26 MED ORDER — NITROGLYCERIN IN D5W 200-5 MCG/ML-% IV SOLN
2.0000 ug/min | INTRAVENOUS | Status: AC
  Administered 2013-01-27: 5 ug/min via INTRAVENOUS
  Filled 2013-01-26: qty 250

## 2013-01-26 MED ORDER — DEXTROSE 5 % IV SOLN
1.5000 g | INTRAVENOUS | Status: AC
  Administered 2013-01-27: .75 g via INTRAVENOUS
  Administered 2013-01-27: 1.5 g via INTRAVENOUS
  Filled 2013-01-26 (×2): qty 1.5

## 2013-01-26 MED ORDER — DOPAMINE-DEXTROSE 3.2-5 MG/ML-% IV SOLN
2.0000 ug/kg/min | INTRAVENOUS | Status: DC
  Filled 2013-01-26: qty 250

## 2013-01-26 MED ORDER — POTASSIUM CHLORIDE 2 MEQ/ML IV SOLN
80.0000 meq | INTRAVENOUS | Status: DC
  Filled 2013-01-26: qty 40

## 2013-01-26 MED ORDER — DEXTROSE 5 % IV SOLN
750.0000 mg | INTRAVENOUS | Status: DC
  Filled 2013-01-26: qty 750

## 2013-01-26 MED ORDER — SODIUM CHLORIDE 0.9 % IV SOLN
1500.0000 mg | INTRAVENOUS | Status: AC
  Administered 2013-01-27: 1500 mg via INTRAVENOUS
  Filled 2013-01-26: qty 1500

## 2013-01-26 MED ORDER — SODIUM CHLORIDE 0.9 % IV SOLN
INTRAVENOUS | Status: AC
  Administered 2013-01-27: 1 [IU]/h via INTRAVENOUS
  Filled 2013-01-26: qty 1

## 2013-01-26 MED ORDER — DEXMEDETOMIDINE HCL IN NACL 400 MCG/100ML IV SOLN
0.1000 ug/kg/h | INTRAVENOUS | Status: AC
  Administered 2013-01-27: 0.2 ug/kg/h via INTRAVENOUS
  Filled 2013-01-26: qty 100

## 2013-01-26 MED ORDER — MAGNESIUM SULFATE 50 % IJ SOLN
40.0000 meq | INTRAMUSCULAR | Status: DC
  Filled 2013-01-26: qty 10

## 2013-01-26 MED ORDER — PHENYLEPHRINE HCL 10 MG/ML IJ SOLN
30.0000 ug/min | INTRAVENOUS | Status: AC
  Administered 2013-01-27: 30 ug/min via INTRAVENOUS
  Filled 2013-01-26: qty 2

## 2013-01-26 MED ORDER — VERAPAMIL HCL 2.5 MG/ML IV SOLN
INTRAVENOUS | Status: AC
  Administered 2013-01-27: 10:00:00
  Filled 2013-01-26: qty 2.5

## 2013-01-26 MED ORDER — EPINEPHRINE HCL 1 MG/ML IJ SOLN
0.5000 ug/min | INTRAVENOUS | Status: DC
  Filled 2013-01-26: qty 4

## 2013-01-26 NOTE — H&P (Signed)
CARDIOTHORACIC SURGERY HISTORY AND PHYSICAL EXAM   Referring Provider is [name], MD PCP is ROBERTS, Vernie Ammons, MD    Chief Complaint   Patient presents with   .  Coronary Artery Disease       Referral from Dr Excell Seltzer for surgical eval for CABG/MAZE, ECHO 10/26/12, Cardiac Cath 01/17/13   .  Atrial Fibrillation     HPI:  Patient is a 72 year old obese white male with history of coronary artery disease with ischemic cardiomyopathy, persistent atrial fibrillation, type 2 diabetes mellitus, hyperlipidemia, and obstructive sleep apnea.  In 2008 the patient presented with an acute inferior wall myocardial infarction. He was treated with PCI and stenting of the right coronary artery using a bare-metal stent. He has been followed intermittently by Dr. Excell Seltzer ever since. This past November he presented for follow up noting that his pulse rate was elevated and irregular.  He was seen by Norma Fredrickson who noted that he was in atrial fibrillation.  His beta blocker was increased for rate control and he was started on Xarelto.  An echocardiogram was performed demonstrating ejection fraction 35-40%.  The left atrium was enlarged but there were no significant valvular abnormalities, but there were segmental wall motion abnormalities including left anterolateral hypokinesis and severe hypokinesis of the inferoposterior wall.  He underwent DC cardioversion on 11/30/2012. Unfortunately he went back into atrial fibrillation by the time he was seen in follow up on 12/09/2012.  Because of his history of two-vessel coronary artery disease, followup catheterization was performed 01/17/2013.  This revealed significant progression of disease in the left anterior descending coronary artery with moderate right coronary artery stenosis. The patient was referred for surgical consultation to consider coronary artery bypass grafting with Maze procedure.  The patient describes relatively mild symptoms of  exertional shortness of breath and occasional mild exertional chest tightness. The patient has remained fairly active physically and up until last week had still been going to the gym to exercise on a regular basis. He has noticed that his exercise tolerance is decreased, his energy level is decreased, and he does get short of breath with moderate activity. He denies any resting shortness of breath, PND, orthopnea, or lower extremity edema. She's had occasional mild episodes of tightness across his chest but this is only occur with strenuous physical activity. He is aware that his pulse is irregular but he denies palpitations. He has not had dizzy spells nor syncope.    Past Medical History   Diagnosis  Date   .  Coronary artery disease     .  Hyperlipidemia     .  Allergy     .  Atrial fibrillation         persistent, failed DCCV   .  Bronchitis     .  Sleep apnea     .  Cardiomyopathy, ischemic     .  Type II diabetes mellitus     .  Obesity (BMI 30-39.9)     .  Myocardial infarction  2008       inferior wall, treated with BMS in RCA   .  Hypertension         Past Surgical History   Procedure  Laterality  Date   .  Tonsillectomy    1952   .  Coronary angioplasty with stent placement       .  Nasal septum surgery    2004  and sinus repair penta   .  Umbilical hernia repair    01/2010   .  Hernia repair    12/18/2009       Incarcerated umbilical hernia   .  Cardioversion    11/30/2012       Procedure: CARDIOVERSION;  Surgeon: [name], MD;  Location: [facility] ENDOSCOPY;  Service: Cardiovascular;  Laterality: [name];       Family History   Problem  Relation  Age of Onset   .  Heart disease  Mother     .  Diabetes  Father     .  Lung disease  Father     .  Cancer  Father         colon       History      Social History   .  Marital Status:  Married       Spouse Name:  [name]       Number of Children:  [name]   .  Years of Education:  [name]      Occupational History   .  Not on  file.      Social History Main Topics   .  Smoking status:  Former Smoker       Types:  Cigarettes, Pipe       Quit date:  01/20/1979   .  Smokeless tobacco:  Never Used         Comment: mostly smoke pipes   .  Alcohol Use:  Yes         Comment: rare   .  Drug Use:  No   .  Sexually Active:  Not on file      Other Topics  Concern   .  Not on file      Social History Narrative   .  No narrative on file       Current Outpatient Prescriptions   Medication  Sig  Dispense  Refill   .  cetirizine (ZYRTEC) 10 MG tablet  Take 10 mg by mouth daily.          Marland Kitchen  ezetimibe (ZETIA) 10 MG tablet  Take 10 mg by mouth daily.         .  metFORMIN (GLUCOPHAGE) 500 MG tablet  Take 1/2 tab twice a day         .  metoprolol succinate (TOPROL-XL) 25 MG 24 hr tablet  Take 25 mg by mouth daily.         .  Rivaroxaban (XARELTO) 20 MG TABS  Take 1 tablet (20 mg total) by mouth daily.   30 tablet   6   .  sitaGLIPtin (JANUVIA) 100 MG tablet  Take 100 mg by mouth daily.            No current facility-administered medications for this visit.       Allergies   Allergen  Reactions   .  Asa Arthritis Strength-Antacid (Aspirin Buffered)         Bleeding tendencies     .  Metformin And Related         headaches   .  Statins         Aches         Review of Systems:              General:  normal appetite, decreased energy, + weight gain, no weight loss, no fever             Cardiac:                      + mild chest pain but only with strenuous exertion, no chest pain at rest, + SOB with moderate exertion, no resting SOB, no PND, no orthopnea, no palpitations, + arrhythmia, + atrial fibrillation, no LE edema, no dizzy spells, no syncope             Respiratory:                + exertional shortness of breath, no home oxygen, no productive cough, no dry cough, recent episode bronchitis 4-5 weeks ago, no wheezing, no hemoptysis, no asthma, no pain with inspiration or cough, + sleep  apnea, + CPAP at night             GI:                                occasional difficulty swallowing, + mild reflux, no frequent heartburn, no hiatal hernia, no abdominal pain, no constipation, no diarrhea, no hematochezia, no hematemesis, no melena             GU:                              no dysuria,  no frequency, no urinary tract infection, no hematuria, no enlarged prostate, no kidney stones, no kidney disease             Vascular:                     no pain suggestive of claudication, no pain in feet, no leg cramps, no varicose veins, no DVT, no non-healing foot ulcer             Neuro:                         no stroke, no TIA's, no seizures, no headaches, no temporary blindness one eye,  no slurred speech, no peripheral neuropathy, no chronic pain, no instability of gait, no memory/cognitive dysfunction             Musculoskeletal:         + 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, no joint swelling, no myalgias, no difficulty walking, normal mobility               Skin:                            no rash, no itching, no skin infections, no pressure sores or ulcerations             Psych:                         no anxiety, no depression, no nervousness, no unusual recent stress             Eyes:                           occasional blurry vision, no floaters, no recent vision changes, + wears glasses or contacts  ENT:                            no hearing loss, no loose or painful teeth, no dentures             Hematologic:               no easy bruising, no abnormal bleeding, noclotting disorder, no frequent epistaxis             Endocrine:                   + diabetes, occasionally checks CBG's at home                           Physical Exam:              BP 126/83  Pulse 45  Resp 20  Ht 5\' 11"  (1.803 m)  Wt 106.142 kg (234 lb)  BMI 32.65 kg/m2  SpO2 98%             General:                      Obese but o/w well-appearing             HEENT:                        Unremarkable               Neck:                           no JVD, no bruits, no adenopathy               Chest:                         clear to auscultation, symmetrical breath sounds, no wheezes, no rhonchi               CV:                              Irregular rate and rhythm, no murmur               Abdomen:                    soft, non-tender, no masses               Extremities:                 warm, well-perfused, pulses palpable, no LE edema             Rectal/GU                   Deferred             Neuro:                         Grossly non-focal and symmetrical throughout             Skin:                            Clean and dry, no rashes, no breakdown  Diagnostic Tests:  ------------------------------------------------------------ Transthoracic Echocardiography  Patient: [name], [name] MR #: [id number] Study Date: 10/26/2012 Gender: M Age: 7 Height: 180.3cm Weight: 110.7kg BSA: 2.12m^2 Pt. Status: Room:  REFERRING Excell Seltzer, Loman Chroman SONOGRAPHER Aida Raider, RDCS PERFORMING [facility] ORDERING Tyrone Sage, Lawson Fiscal Jacobo Forest, Lawson Fiscal cc:  ------------------------------------------------------------ LV EF: 35% - 40%  ------------------------------------------------------------ Indications: Atrial fibrillation - 427.31.  ------------------------------------------------------------ History: PMH: Acquired from the patient and from the patient's chart. PMH: Inferior Mi. CAD. Risk factors: Hypertension. Diabetes mellitus. Obese. Dyslipidemia.  ------------------------------------------------------------ Study Conclusions  - Left ventricle: The cavity size was normal. Wall thickness was increased in a pattern of mild LVH. Indeterminant diastolic function (atrial fibrillation). Systolic function was moderately reduced. The estimated ejection fraction was in the range of 35% to 40%. Posterior akinesis. Basal to mid  anterolateral and basal to mid inferior severe hypokinesis. - Aortic valve: There was no stenosis. - Mitral valve: Trivial regurgitation. - Left atrium: The atrium was moderately dilated. - Right ventricle: The cavity size was normal. Systolic function was normal. - Right atrium: The atrium was mildly to moderately dilated. - Pulmonary arteries: No complete TR doppler jet, so unable to estimate PA systolic pressure. - Inferior vena cava: The vessel was normal in size; the respirophasic diameter changes were in the normal range (= 50%); findings are consistent with normal central venous pressure. Impressions:  - The patient was in atrial fibrillation. Normal LV size with mild LV hypertrophy. EF 35-40% with posterior akinesis and basal to mid inferior and anterolateral severe hypokinesis. Normal RV size and systolic function. Transthoracic echocardiography. M-mode, complete 2D, spectral Doppler, and color Doppler. Height: Height: 180.3cm. Height: 71in. Weight: Weight: 110.7kg. Weight: 243.5lb. Body mass index: BMI: 34kg/m^2. Body surface area: BSA: 2.66m^2. Blood pressure: 140/100. Patient status: Outpatient. Location: [facility]  ------------------------------------------------------------  ------------------------------------------------------------ Left ventricle: The cavity size was normal. Wall thickness was increased in a pattern of mild LVH. Indeterminant diastolic function (atrial fibrillation). Systolic function was moderately reduced. The estimated ejection fraction was in the range of 35% to 40%. Posterior akinesis. Basal to mid anterolateral and basal to mid inferior severe hypokinesis.  ------------------------------------------------------------ Aortic valve: Trileaflet; mildly calcified leaflets. Doppler: There was no stenosis. No regurgitation.  ------------------------------------------------------------ Aorta: Aortic root: The aortic root was normal in  size. Ascending aorta: The ascending aorta was normal in size.  ------------------------------------------------------------ Mitral valve: Doppler: There was no evidence for stenosis. Trivial regurgitation. Peak gradient: 3mm Hg (D).  ------------------------------------------------------------ Left atrium: The atrium was moderately dilated.  ------------------------------------------------------------ Right ventricle: The cavity size was normal. Systolic function was normal.  ------------------------------------------------------------ Pulmonic valve: Structurally normal valve. Cusp separation was normal. Doppler: Transvalvular velocity was within the normal range. No regurgitation.  ------------------------------------------------------------ Tricuspid valve: Doppler: No significant regurgitation.  ------------------------------------------------------------ Pulmonary artery: No complete TR doppler jet, so unable to estimate PA systolic pressure.  ------------------------------------------------------------ Right atrium: The atrium was mildly to moderately dilated.  ------------------------------------------------------------ Pericardium: There was no pericardial effusion.  ------------------------------------------------------------ Systemic veins: Inferior vena cava: The vessel was normal in size; the respirophasic diameter changes were in the normal range (= 50%); findings are consistent with normal central venous pressure.  ------------------------------------------------------------  2D measurements Normal Doppler measurements Normal Left ventricle Left ventricle LVID ED, 41.4 mm 43-52 Ea, lat 13.5 cm/s ------ chord, ann, tiss PLAX DP LVID ES, 31.2 mm 23-38 E/Ea, lat 6.58 ------ chord, ann, tiss PLAX DP FS, chord, 25 % >29 Ea, med 7.14 cm/s ------ PLAX ann, tiss LVPW, ED 12.4  mm ------ DP Ventricular septum E/Ea, med 12.44 ------ IVS, ED 12.4 mm ------  ann, tiss LVOT DP Diam, S 23 mm ------ LVOT Area 4.15 cm^2 ------ Peak vel, 88.5 cm/s ------ Diam 23 mm ------ S Aorta VTI, S 16.1 cm ------ Root diam, 37 mm ------ Stroke vol 66.9 ml ------ ED Stroke 29.2 ml/m^ ------ Left atrium index 2 AP dim 49 mm ------ Mitral valve AP dim 2.14 cm/m^2 <2.2 Peak E vel 88.8 cm/s ------ index Decelerati 144 ms 150-23 on time 0 Peak 3 mm Hg ------ gradient, D Right ventricle Sa vel, 13.8 cm/s ------ lat ann, tiss DP  ------------------------------------------------------------ Prepared and Electronically Authenticated by  Marca Ancona 2013-11-26T21:06:[id number]  Cardiac Catheterization Procedure Note  Name: [name]   MRN: [id number]   DOB: 10-Dec-1940   Procedure: Left Heart Cath, Selective Coronary Angiography, LV angiography   Indication: Cardiomyopathy, known CAD. 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 He was treated with stenting of the right coronary artery. He was lost to followup for a few years but returned several months ago with congestive heart failure and atrial fibrillation. The patient underwent cardioversion but has quickly gone back into atrial fibrillation. He presents today for cardiac catheterization to rule out progressive coronary artery disease as a cause of his progressive LV dysfunction.   Procedural Details: The right wrist was prepped, draped, and anesthetized with 1% lidocaine. Using the modified Seldinger technique, a 5 French sheath was introduced into the right radial artery. 3 mg of verapamil was administered through the sheath, weight-based unfractionated heparin was administered intravenously. Standard Judkins catheters were used for selective coronary angiography and left ventriculography. Catheter exchanges were performed over an exchange length guidewire. There were no immediate procedural complications. A TR band was used for  radial hemostasis at the completion of the procedure. The patient was transferred to the post catheterization recovery area for further monitoring.   Procedural Findings:   Hemodynamics:   AO 105/72 with a mean of 87   LV 106/17   Coronary angiography:   Coronary dominance: right   Left mainstem: The left main is patent. There is minimal irregularity with no significant stenosis. The left main divides into the LAD and left circumflex.   Left anterior descending (LAD): The LAD has diffuse proximal vessel disease. There is a hypodense eccentric plaque in the proximal LAD with associated 70% stenosis. The mid LAD has tandem lesions of 75-80%. There are 3 small diagonal branches that arise from the diseased area of the proximal and mid LAD. Further down in the mid and distal LAD the vessel is small in caliber but has no significant stenosis. The diagonal branches are all patent with mild to moderate ostial stenosis. The LAD reaches the left ventricular apex.   Left circumflex (LCx): The left circumflex is patent throughout its course. There is some calcification and mild disease involving the ostium of the circumflex with approximately 30% stenosis. There is a moderate caliber obtuse marginal branch without significant stenosis. The AV groove circumflex beyond the first obtuse marginal is a small vessel.   Right coronary artery (RCA): The RCA is a large, dominant vessel. The stented segment in the proximal vessel is patent with mild diffuse in-stent restenosis. The mid vessel has tandem lesions estimated at 50%. The distal RCA is patent with diffuse irregularity. The PDA is patent. The posterolateral branch is large without significant stenosis.   Left ventriculography: There is  severe left ventricular systolic dysfunction. The basal and midinferior wall are akinetic. There is severe diffuse hypokinesis, especially of the anterolateral wall. The apex is also hypokinetic. The estimated left ventricular  ejection fraction is 30%.   Final Conclusions:   1. Severe proximal and mid LAD stenosis   2. Patent right coronary artery with moderate mid vessel stenosis   3. Mild nonobstructive left circumflex stenosis   4. Severe segmental left ventricular systolic dysfunction.   Recommendations: The patient has diabetes and severe LV dysfunction. I suspect atrial fibrillation is playing a significant role in his progressive LV dysfunction, but coronary artery disease and negative remodeling have likely impacted this as well. I think he should be considered for LIMA to LAD bypass and surgical maze procedure. I am going to refer him for a cardiac surgery consultation.   [name]   01/17/2013, 9:50 AM     Impression:  Severe two-vessel coronary artery disease with ischemic cardiomyopathy and moderate left ventricular dysfunction. There is high-grade stenosis of the left anterior descending coronary artery and moderate disease in the previously stented right coronary artery with history of inferior wall myocardial infarction in 2008. The patient has recently developed persistent atrial fibrillation which failed an attempted DC cardioversion. The patient has fairly mild and relatively stable symptoms of exertional shortness of breath. I agree that he would probably best be treated with coronary artery bypass grafting and Maze procedure as most definitive means to treat all of his cardiac problems with associated decrease risk of future cardiac event and prolong long-term survival. Options include PCI and stenting of the left anterior descending coronary artery with or without RF ablation for atrial fibrillation versus medical therapy alone without any intervention at this time.   Plan:  I've discussed options at length with the patient and his wife in the office today. We discussed the indications, risks, and potential benefits of coronary artery bypass grafting and Maze procedure. They understand and  accept all potential associated risks of surgery including but not limited to risk of death, stroke, myocardial infarction, congestive heart failure, respiratory failure, renal failure, pneumonia, bleeding requiring blood transfusion, arrhythmia including late recurrence of atrial fibrillation, heart block or bradycardia requiring permanent pacemaker placement, pulmonary embolus, and late recurrence of symptomatic coronary artery disease.  All of their questions been addressed. We tentatively plan to proceed with surgery on February 27. We will begin the patient on amiodarone prior to surgery. The patient will stop taking Xarelto this weekend in anticipation of surgery next week.    Salvatore Decent. Cornelius Moras, MD 01/19/2013 12:05 PM

## 2013-01-27 ENCOUNTER — Inpatient Hospital Stay (HOSPITAL_COMMUNITY)
Admission: RE | Admit: 2013-01-27 | Discharge: 2013-02-04 | DRG: 236 | Disposition: A | Discharge status: Home / Self Care | Payer: Medicare Other | Source: Ambulatory Visit | Attending: Thoracic Surgery (Cardiothoracic Vascular Surgery) | Admitting: Thoracic Surgery (Cardiothoracic Vascular Surgery)

## 2013-01-27 ENCOUNTER — Inpatient Hospital Stay (HOSPITAL_COMMUNITY): Payer: Medicare Other | Admitting: Anesthesiology

## 2013-01-27 ENCOUNTER — Encounter (HOSPITAL_COMMUNITY): Payer: Self-pay | Admitting: Anesthesiology

## 2013-01-27 ENCOUNTER — Encounter (HOSPITAL_COMMUNITY): Payer: Self-pay | Admitting: *Deleted

## 2013-01-27 ENCOUNTER — Inpatient Hospital Stay (HOSPITAL_COMMUNITY): Payer: Medicare Other

## 2013-01-27 ENCOUNTER — Encounter (HOSPITAL_COMMUNITY)
Admission: RE | Disposition: A | Discharge status: Home / Self Care | Payer: Self-pay | Source: Ambulatory Visit | Attending: Thoracic Surgery (Cardiothoracic Vascular Surgery)

## 2013-01-27 DIAGNOSIS — Z9889 Other specified postprocedural states: Secondary | ICD-10-CM

## 2013-01-27 DIAGNOSIS — Z6834 Body mass index (BMI) 34.0-34.9, adult: Secondary | ICD-10-CM

## 2013-01-27 DIAGNOSIS — I2510 Atherosclerotic heart disease of native coronary artery without angina pectoris: Principal | ICD-10-CM | POA: Diagnosis present

## 2013-01-27 DIAGNOSIS — E785 Hyperlipidemia, unspecified: Secondary | ICD-10-CM | POA: Diagnosis present

## 2013-01-27 DIAGNOSIS — Z7982 Long term (current) use of aspirin: Secondary | ICD-10-CM

## 2013-01-27 DIAGNOSIS — I2589 Other forms of chronic ischemic heart disease: Secondary | ICD-10-CM | POA: Diagnosis present

## 2013-01-27 DIAGNOSIS — Y9210 Unspecified residential institution as the place of occurrence of the external cause: Secondary | ICD-10-CM | POA: Diagnosis not present

## 2013-01-27 DIAGNOSIS — G4733 Obstructive sleep apnea (adult) (pediatric): Secondary | ICD-10-CM | POA: Diagnosis present

## 2013-01-27 DIAGNOSIS — J988 Other specified respiratory disorders: Secondary | ICD-10-CM | POA: Diagnosis not present

## 2013-01-27 DIAGNOSIS — N289 Disorder of kidney and ureter, unspecified: Secondary | ICD-10-CM | POA: Diagnosis not present

## 2013-01-27 DIAGNOSIS — I498 Other specified cardiac arrhythmias: Secondary | ICD-10-CM | POA: Diagnosis not present

## 2013-01-27 DIAGNOSIS — Z8249 Family history of ischemic heart disease and other diseases of the circulatory system: Secondary | ICD-10-CM

## 2013-01-27 DIAGNOSIS — Z951 Presence of aortocoronary bypass graft: Secondary | ICD-10-CM

## 2013-01-27 DIAGNOSIS — Z7901 Long term (current) use of anticoagulants: Secondary | ICD-10-CM

## 2013-01-27 DIAGNOSIS — Z9861 Coronary angioplasty status: Secondary | ICD-10-CM

## 2013-01-27 DIAGNOSIS — Z79899 Other long term (current) drug therapy: Secondary | ICD-10-CM

## 2013-01-27 DIAGNOSIS — D62 Acute posthemorrhagic anemia: Secondary | ICD-10-CM | POA: Diagnosis not present

## 2013-01-27 DIAGNOSIS — E871 Hypo-osmolality and hyponatremia: Secondary | ICD-10-CM | POA: Diagnosis not present

## 2013-01-27 DIAGNOSIS — I10 Essential (primary) hypertension: Secondary | ICD-10-CM | POA: Diagnosis present

## 2013-01-27 DIAGNOSIS — E119 Type 2 diabetes mellitus without complications: Secondary | ICD-10-CM | POA: Diagnosis present

## 2013-01-27 DIAGNOSIS — Z833 Family history of diabetes mellitus: Secondary | ICD-10-CM

## 2013-01-27 DIAGNOSIS — J9819 Other pulmonary collapse: Secondary | ICD-10-CM | POA: Diagnosis not present

## 2013-01-27 DIAGNOSIS — E876 Hypokalemia: Secondary | ICD-10-CM | POA: Diagnosis not present

## 2013-01-27 DIAGNOSIS — I252 Old myocardial infarction: Secondary | ICD-10-CM

## 2013-01-27 DIAGNOSIS — Y832 Surgical operation with anastomosis, bypass or graft as the cause of abnormal reaction of the patient, or of later complication, without mention of misadventure at the time of the procedure: Secondary | ICD-10-CM | POA: Diagnosis not present

## 2013-01-27 DIAGNOSIS — Z87891 Personal history of nicotine dependence: Secondary | ICD-10-CM

## 2013-01-27 DIAGNOSIS — I4891 Unspecified atrial fibrillation: Secondary | ICD-10-CM

## 2013-01-27 DIAGNOSIS — D696 Thrombocytopenia, unspecified: Secondary | ICD-10-CM | POA: Diagnosis not present

## 2013-01-27 DIAGNOSIS — Z8679 Personal history of other diseases of the circulatory system: Secondary | ICD-10-CM

## 2013-01-27 DIAGNOSIS — E8779 Other fluid overload: Secondary | ICD-10-CM | POA: Diagnosis not present

## 2013-01-27 HISTORY — PX: INTRAOPERATIVE TRANSESOPHAGEAL ECHOCARDIOGRAM: SHX5062

## 2013-01-27 HISTORY — DX: Presence of aortocoronary bypass graft: Z95.1

## 2013-01-27 HISTORY — PX: MAZE: SHX5063

## 2013-01-27 HISTORY — DX: Personal history of other diseases of the circulatory system: Z86.79

## 2013-01-27 HISTORY — DX: Other specified postprocedural states: Z98.890

## 2013-01-27 HISTORY — PX: CORONARY ARTERY BYPASS GRAFT: SHX141

## 2013-01-27 LAB — CBC
HCT: 39 % (ref 39.0–52.0)
HCT: 38.5 % — ABNORMAL LOW (ref 39.0–52.0)
Hemoglobin: 13.8 g/dL (ref 13.0–17.0)
Hemoglobin: 13.7 g/dL (ref 13.0–17.0)
MCH: 31.9 pg (ref 26.0–34.0)
MCH: 31.7 pg (ref 26.0–34.0)
MCHC: 35.4 g/dL (ref 30.0–36.0)
MCHC: 35.6 g/dL (ref 30.0–36.0)
MCV: 90.1 fL (ref 78.0–100.0)
MCV: 89.1 fL (ref 78.0–100.0)
Platelets: 120 10*3/uL — ABNORMAL LOW (ref 150–400)
Platelets: 128 10*3/uL — ABNORMAL LOW (ref 150–400)
RBC: 4.33 MIL/uL (ref 4.22–5.81)
RBC: 4.32 MIL/uL (ref 4.22–5.81)
RDW: 13.6 % (ref 11.5–15.5)
RDW: 13.5 % (ref 11.5–15.5)
WBC: 14 10*3/uL — ABNORMAL HIGH (ref 4.0–10.5)
WBC: 15.7 10*3/uL — ABNORMAL HIGH (ref 4.0–10.5)

## 2013-01-27 LAB — POCT I-STAT 4, (NA,K, GLUC, HGB,HCT)
Glucose, Bld: 136 mg/dL — ABNORMAL HIGH (ref 70–99)
Glucose, Bld: 136 mg/dL — ABNORMAL HIGH (ref 70–99)
Glucose, Bld: 117 mg/dL — ABNORMAL HIGH (ref 70–99)
Glucose, Bld: 149 mg/dL — ABNORMAL HIGH (ref 70–99)
Glucose, Bld: 180 mg/dL — ABNORMAL HIGH (ref 70–99)
Glucose, Bld: 133 mg/dL — ABNORMAL HIGH (ref 70–99)
HCT: 43 % (ref 39.0–52.0)
HCT: 43 % (ref 39.0–52.0)
HCT: 32 % — ABNORMAL LOW (ref 39.0–52.0)
HCT: 36 % — ABNORMAL LOW (ref 39.0–52.0)
HCT: 33 % — ABNORMAL LOW (ref 39.0–52.0)
HCT: 38 % — ABNORMAL LOW (ref 39.0–52.0)
Hemoglobin: 14.6 g/dL (ref 13.0–17.0)
Hemoglobin: 14.6 g/dL (ref 13.0–17.0)
Hemoglobin: 10.9 g/dL — ABNORMAL LOW (ref 13.0–17.0)
Hemoglobin: 12.2 g/dL — ABNORMAL LOW (ref 13.0–17.0)
Hemoglobin: 11.2 g/dL — ABNORMAL LOW (ref 13.0–17.0)
Hemoglobin: 12.9 g/dL — ABNORMAL LOW (ref 13.0–17.0)
Potassium: 5 mEq/L (ref 3.5–5.1)
Potassium: 5.1 mEq/L (ref 3.5–5.1)
Potassium: 4.4 mEq/L (ref 3.5–5.1)
Potassium: 5.6 mEq/L — ABNORMAL HIGH (ref 3.5–5.1)
Potassium: 4.4 mEq/L (ref 3.5–5.1)
Potassium: 4.1 mEq/L (ref 3.5–5.1)
Sodium: 137 mEq/L (ref 135–145)
Sodium: 136 mEq/L (ref 135–145)
Sodium: 133 mEq/L — ABNORMAL LOW (ref 135–145)
Sodium: 134 mEq/L — ABNORMAL LOW (ref 135–145)
Sodium: 136 mEq/L (ref 135–145)
Sodium: 138 mEq/L (ref 135–145)

## 2013-01-27 LAB — POCT I-STAT 3, ART BLOOD GAS (G3+)
Acid-base deficit: 2 mmol/L (ref 0.0–2.0)
Acid-base deficit: 3 mmol/L — ABNORMAL HIGH (ref 0.0–2.0)
Acid-base deficit: 3 mmol/L — ABNORMAL HIGH (ref 0.0–2.0)
Acid-base deficit: 3 mmol/L — ABNORMAL HIGH (ref 0.0–2.0)
Acid-base deficit: 3 mmol/L — ABNORMAL HIGH (ref 0.0–2.0)
Bicarbonate: 23.8 mEq/L (ref 20.0–24.0)
Bicarbonate: 22.1 mEq/L (ref 20.0–24.0)
Bicarbonate: 23.1 mEq/L (ref 20.0–24.0)
Bicarbonate: 22.2 mEq/L (ref 20.0–24.0)
Bicarbonate: 22.6 mEq/L (ref 20.0–24.0)
O2 Saturation: 100 %
O2 Saturation: 93 %
O2 Saturation: 96 %
O2 Saturation: 89 %
O2 Saturation: 92 %
Patient temperature: 36.6
Patient temperature: 37
Patient temperature: 36.8
TCO2: 25 mmol/L (ref 0–100)
TCO2: 23 mmol/L (ref 0–100)
TCO2: 24 mmol/L (ref 0–100)
TCO2: 23 mmol/L (ref 0–100)
TCO2: 24 mmol/L (ref 0–100)
pCO2 arterial: 43.5 mmHg (ref 35.0–45.0)
pCO2 arterial: 37.4 mmHg (ref 35.0–45.0)
pCO2 arterial: 43.2 mmHg (ref 35.0–45.0)
pCO2 arterial: 41.6 mmHg (ref 35.0–45.0)
pCO2 arterial: 43 mmHg (ref 35.0–45.0)
pH, Arterial: 7.345 — ABNORMAL LOW (ref 7.350–7.450)
pH, Arterial: 7.379 (ref 7.350–7.450)
pH, Arterial: 7.334 — ABNORMAL LOW (ref 7.350–7.450)
pH, Arterial: 7.336 — ABNORMAL LOW (ref 7.350–7.450)
pH, Arterial: 7.328 — ABNORMAL LOW (ref 7.350–7.450)
pO2, Arterial: 318 mmHg — ABNORMAL HIGH (ref 80.0–100.0)
pO2, Arterial: 69 mmHg — ABNORMAL LOW (ref 80.0–100.0)
pO2, Arterial: 84 mmHg (ref 80.0–100.0)
pO2, Arterial: 59 mmHg — ABNORMAL LOW (ref 80.0–100.0)
pO2, Arterial: 69 mmHg — ABNORMAL LOW (ref 80.0–100.0)

## 2013-01-27 LAB — PROTIME-INR
INR: 1.27 (ref 0.00–1.49)
Prothrombin Time: 15.6 seconds — ABNORMAL HIGH (ref 11.6–15.2)

## 2013-01-27 LAB — CREATININE, SERUM
Creatinine, Ser: 1.23 mg/dL (ref 0.50–1.35)
GFR calc Af Amer: 67 mL/min — ABNORMAL LOW (ref >90)
GFR calc non Af Amer: 58 mL/min — ABNORMAL LOW (ref >90)

## 2013-01-27 LAB — HEMOGLOBIN AND HEMATOCRIT, BLOOD
HCT: 35.5 % — ABNORMAL LOW (ref 39.0–52.0)
Hemoglobin: 12.8 g/dL — ABNORMAL LOW (ref 13.0–17.0)

## 2013-01-27 LAB — APTT: aPTT: 27 seconds (ref 24–37)

## 2013-01-27 LAB — PLATELET COUNT: Platelets: 136 10*3/uL — ABNORMAL LOW (ref 150–400)

## 2013-01-27 LAB — GLUCOSE, CAPILLARY: Glucose-Capillary: 165 mg/dL — ABNORMAL HIGH (ref 70–99)

## 2013-01-27 LAB — MAGNESIUM: Magnesium: 3 mg/dL — ABNORMAL HIGH (ref 1.5–2.5)

## 2013-01-27 SURGERY — CORONARY ARTERY BYPASS GRAFTING (CABG)
Anesthesia: General | Site: Chest | Wound class: Clean

## 2013-01-27 MED ORDER — MAGNESIUM SULFATE 40 MG/ML IJ SOLN
4.0000 g | Freq: Once | INTRAMUSCULAR | Status: AC
  Administered 2013-01-27: 4 g via INTRAVENOUS
  Filled 2013-01-27: qty 100

## 2013-01-27 MED ORDER — INSULIN REGULAR BOLUS VIA INFUSION
0.0000 [IU] | Freq: Three times a day (TID) | INTRAVENOUS | Status: DC
  Administered 2013-01-28: 5 [IU] via INTRAVENOUS
  Filled 2013-01-27: qty 10

## 2013-01-27 MED ORDER — LACTATED RINGERS IV SOLN
INTRAVENOUS | Status: DC
  Administered 2013-01-27: 14:00:00 via INTRAVENOUS

## 2013-01-27 MED ORDER — OXYCODONE HCL 5 MG PO TABS
5.0000 mg | ORAL_TABLET | ORAL | Status: DC | PRN
  Administered 2013-01-28: 5 mg via ORAL
  Administered 2013-01-28: 10 mg via ORAL
  Administered 2013-01-28 (×2): 5 mg via ORAL
  Administered 2013-01-28: 10 mg via ORAL
  Administered 2013-01-28: 5 mg via ORAL
  Administered 2013-01-29 – 2013-01-31 (×8): 10 mg via ORAL
  Administered 2013-01-31: 5 mg via ORAL
  Administered 2013-02-01 – 2013-02-03 (×12): 10 mg via ORAL
  Administered 2013-02-03: 5 mg via ORAL
  Administered 2013-02-03 – 2013-02-04 (×2): 10 mg via ORAL
  Filled 2013-01-27 (×7): qty 2
  Filled 2013-01-27: qty 1
  Filled 2013-01-27 (×2): qty 2
  Filled 2013-01-27: qty 1
  Filled 2013-01-27 (×3): qty 2
  Filled 2013-01-27: qty 1
  Filled 2013-01-27 (×4): qty 2
  Filled 2013-01-27: qty 1
  Filled 2013-01-27 (×10): qty 2

## 2013-01-27 MED ORDER — SODIUM CHLORIDE 0.9 % IV SOLN
INTRAVENOUS | Status: DC | PRN
  Administered 2013-01-27: 13:00:00 via INTRAVENOUS

## 2013-01-27 MED ORDER — SODIUM CHLORIDE 0.9 % IJ SOLN
3.0000 mL | INTRAMUSCULAR | Status: DC | PRN
  Administered 2013-01-28 (×2): 3 mL via INTRAVENOUS

## 2013-01-27 MED ORDER — SODIUM CHLORIDE 0.45 % IV SOLN
INTRAVENOUS | Status: DC
  Administered 2013-01-27 – 2013-01-28 (×2): via INTRAVENOUS

## 2013-01-27 MED ORDER — DOPAMINE-DEXTROSE 1.6-5 MG/ML-% IV SOLN
INTRAVENOUS | Status: DC | PRN
  Administered 2013-01-27: 3 ug/kg/min via INTRAVENOUS

## 2013-01-27 MED ORDER — PROTAMINE SULFATE 10 MG/ML IV SOLN
INTRAVENOUS | Status: DC | PRN
  Administered 2013-01-27: 250 mg via INTRAVENOUS

## 2013-01-27 MED ORDER — PROPOFOL 10 MG/ML IV BOLUS
INTRAVENOUS | Status: DC | PRN
  Administered 2013-01-27: 80 mg via INTRAVENOUS

## 2013-01-27 MED ORDER — ALBUMIN HUMAN 5 % IV SOLN
250.0000 mL | INTRAVENOUS | Status: AC | PRN
Start: 2013-01-27 — End: 2013-01-28
  Administered 2013-01-27 (×3): 250 mL via INTRAVENOUS
  Filled 2013-01-27: qty 250

## 2013-01-27 MED ORDER — MIDAZOLAM HCL 5 MG/5ML IJ SOLN
INTRAMUSCULAR | Status: DC | PRN
  Administered 2013-01-27: 5 mg via INTRAVENOUS
  Administered 2013-01-27: 2 mg via INTRAVENOUS
  Administered 2013-01-27: 3 mg via INTRAVENOUS

## 2013-01-27 MED ORDER — HEPARIN SODIUM (PORCINE) 1000 UNIT/ML IJ SOLN
INTRAMUSCULAR | Status: DC | PRN
  Administered 2013-01-27: 27000 [IU] via INTRAVENOUS
  Administered 2013-01-27: 3000 [IU] via INTRAVENOUS

## 2013-01-27 MED ORDER — MORPHINE SULFATE 2 MG/ML IJ SOLN: 1.0000 mg | INTRAMUSCULAR | Status: AC | PRN

## 2013-01-27 MED ORDER — EPHEDRINE SULFATE 50 MG/ML IJ SOLN
INTRAMUSCULAR | Status: DC | PRN
  Administered 2013-01-27: 5 mg via INTRAVENOUS

## 2013-01-27 MED ORDER — MORPHINE SULFATE 2 MG/ML IJ SOLN
2.0000 mg | INTRAMUSCULAR | Status: DC | PRN
  Administered 2013-01-27 – 2013-01-28 (×2): 1 mg via INTRAVENOUS
  Administered 2013-01-28 (×2): 2 mg via INTRAVENOUS
  Filled 2013-01-27: qty 1
  Filled 2013-01-27: qty 2

## 2013-01-27 MED ORDER — BISACODYL 10 MG RE SUPP: 10.0000 mg | Freq: Every day | RECTAL | Status: DC

## 2013-01-27 MED ORDER — MIDAZOLAM HCL 2 MG/2ML IJ SOLN: 2.0000 mg | INTRAMUSCULAR | Status: DC | PRN

## 2013-01-27 MED ORDER — PANTOPRAZOLE SODIUM 40 MG PO TBEC
40.0000 mg | DELAYED_RELEASE_TABLET | Freq: Every day | ORAL | Status: DC
  Administered 2013-01-29 – 2013-02-04 (×6): 40 mg via ORAL
  Filled 2013-01-27 (×6): qty 1

## 2013-01-27 MED ORDER — DEXTROSE 5 % IV SOLN
1.5000 g | Freq: Two times a day (BID) | INTRAVENOUS | Status: AC
  Administered 2013-01-27 – 2013-01-29 (×4): 1.5 g via INTRAVENOUS
  Filled 2013-01-27 (×4): qty 1.5

## 2013-01-27 MED ORDER — DOCUSATE SODIUM 100 MG PO CAPS
200.0000 mg | ORAL_CAPSULE | Freq: Every day | ORAL | Status: DC
  Administered 2013-01-28 – 2013-02-04 (×7): 200 mg via ORAL
  Filled 2013-01-27 (×8): qty 2

## 2013-01-27 MED ORDER — PHENYLEPHRINE HCL 10 MG/ML IJ SOLN
0.0000 ug/min | INTRAVENOUS | Status: DC
  Filled 2013-01-27: qty 2

## 2013-01-27 MED ORDER — ACETAMINOPHEN 160 MG/5ML PO SOLN
975.0000 mg | Freq: Four times a day (QID) | ORAL | Status: DC
  Administered 2013-01-27: 975 mg
  Filled 2013-01-27: qty 40.6

## 2013-01-27 MED ORDER — ASPIRIN EC 325 MG PO TBEC
325.0000 mg | DELAYED_RELEASE_TABLET | Freq: Every day | ORAL | Status: DC
  Administered 2013-01-28: 325 mg via ORAL
  Filled 2013-01-27 (×2): qty 1

## 2013-01-27 MED ORDER — FENTANYL CITRATE 0.05 MG/ML IJ SOLN
INTRAMUSCULAR | Status: DC | PRN
  Administered 2013-01-27: 400 ug via INTRAVENOUS
  Administered 2013-01-27: 100 ug via INTRAVENOUS
  Administered 2013-01-27: 1000 ug via INTRAVENOUS

## 2013-01-27 MED ORDER — METOPROLOL TARTRATE 25 MG/10 ML ORAL SUSPENSION
12.5000 mg | Freq: Two times a day (BID) | ORAL | Status: DC
  Filled 2013-01-27 (×3): qty 5

## 2013-01-27 MED ORDER — NITROGLYCERIN IN D5W 200-5 MCG/ML-% IV SOLN: 0.0000 ug/min | INTRAVENOUS | Status: DC

## 2013-01-27 MED ORDER — METOPROLOL TARTRATE 12.5 MG HALF TABLET
12.5000 mg | ORAL_TABLET | Freq: Two times a day (BID) | ORAL | Status: DC
  Filled 2013-01-27 (×3): qty 1

## 2013-01-27 MED ORDER — DEXMEDETOMIDINE HCL IN NACL 400 MCG/100ML IV SOLN
0.1000 ug/kg/h | INTRAVENOUS | Status: DC
  Filled 2013-01-27: qty 100

## 2013-01-27 MED ORDER — SODIUM CHLORIDE 0.9 % IV SOLN
INTRAVENOUS | Status: DC
  Administered 2013-01-27: 14:00:00 via INTRAVENOUS

## 2013-01-27 MED ORDER — METOPROLOL TARTRATE 1 MG/ML IV SOLN: 2.5000 mg | INTRAVENOUS | Status: DC | PRN

## 2013-01-27 MED ORDER — SODIUM CHLORIDE 0.9 % IJ SOLN
3.0000 mL | Freq: Two times a day (BID) | INTRAMUSCULAR | Status: DC
  Administered 2013-01-28: 3 mL via INTRAVENOUS

## 2013-01-27 MED ORDER — VANCOMYCIN HCL 1000 MG IV SOLR
INTRAVENOUS | Status: AC
  Administered 2013-01-27: 10:00:00
  Filled 2013-01-27: qty 1000

## 2013-01-27 MED ORDER — ARTIFICIAL TEARS OP OINT
TOPICAL_OINTMENT | OPHTHALMIC | Status: DC | PRN
  Administered 2013-01-27: 1 via OPHTHALMIC

## 2013-01-27 MED ORDER — SODIUM CHLORIDE 0.9 % IV SOLN
INTRAVENOUS | Status: DC
  Administered 2013-01-27: 4.4 [IU]/h via INTRAVENOUS
  Filled 2013-01-27 (×2): qty 1

## 2013-01-27 MED ORDER — DOPAMINE-DEXTROSE 3.2-5 MG/ML-% IV SOLN
3.0000 ug/kg/min | INTRAVENOUS | Status: DC
  Administered 2013-01-27: 3 ug/kg/min via INTRAVENOUS

## 2013-01-27 MED ORDER — ROCURONIUM BROMIDE 100 MG/10ML IV SOLN
INTRAVENOUS | Status: DC | PRN
  Administered 2013-01-27 (×2): 50 mg via INTRAVENOUS
  Administered 2013-01-27: 100 mg via INTRAVENOUS
  Administered 2013-01-27 (×3): 50 mg via INTRAVENOUS

## 2013-01-27 MED ORDER — FAMOTIDINE IN NACL 20-0.9 MG/50ML-% IV SOLN
20.0000 mg | Freq: Two times a day (BID) | INTRAVENOUS | Status: DC
  Administered 2013-01-27: 20 mg via INTRAVENOUS

## 2013-01-27 MED ORDER — SUCCINYLCHOLINE CHLORIDE 20 MG/ML IJ SOLN
INTRAMUSCULAR | Status: DC | PRN
  Administered 2013-01-27: 160 mg via INTRAVENOUS

## 2013-01-27 MED ORDER — SODIUM CHLORIDE 0.9 % IJ SOLN
OROMUCOSAL | Status: DC | PRN
  Administered 2013-01-27 (×3): via TOPICAL

## 2013-01-27 MED ORDER — 0.9 % SODIUM CHLORIDE (POUR BTL) OPTIME
TOPICAL | Status: DC | PRN
  Administered 2013-01-27: 6000 mL

## 2013-01-27 MED ORDER — CALCIUM CHLORIDE 10 % IV SOLN
1.0000 g | Freq: Once | INTRAVENOUS | Status: AC | PRN
  Administered 2013-01-27: 1 g via INTRAVENOUS
  Filled 2013-01-27: qty 10

## 2013-01-27 MED ORDER — SODIUM CHLORIDE 0.9 % IV SOLN: 250.0000 mL | INTRAVENOUS | Status: DC | PRN

## 2013-01-27 MED ORDER — ONDANSETRON HCL 4 MG/2ML IJ SOLN: 4.0000 mg | Freq: Four times a day (QID) | INTRAMUSCULAR | Status: DC | PRN

## 2013-01-27 MED ORDER — AMINOCAPROIC ACID 250 MG/ML IV SOLN
1.0000 g/h | INTRAVENOUS | Status: DC
  Filled 2013-01-27: qty 20

## 2013-01-27 MED ORDER — ACETAMINOPHEN 500 MG PO TABS
1000.0000 mg | ORAL_TABLET | Freq: Four times a day (QID) | ORAL | Status: AC
  Administered 2013-01-28 – 2013-01-31 (×12): 1000 mg via ORAL
  Administered 2013-02-01: 500 mg via ORAL
  Administered 2013-02-01: 1000 mg via ORAL
  Filled 2013-01-27 (×18): qty 2

## 2013-01-27 MED ORDER — LACTATED RINGERS IV SOLN
INTRAVENOUS | Status: DC | PRN
  Administered 2013-01-27 (×5): via INTRAVENOUS

## 2013-01-27 MED ORDER — POTASSIUM CHLORIDE 10 MEQ/50ML IV SOLN: 10.0000 meq | INTRAVENOUS | Status: AC

## 2013-01-27 MED ORDER — BISACODYL 5 MG PO TBEC
10.0000 mg | DELAYED_RELEASE_TABLET | Freq: Every day | ORAL | Status: DC
  Administered 2013-01-28 – 2013-02-04 (×5): 10 mg via ORAL
  Filled 2013-01-27 (×6): qty 2

## 2013-01-27 MED ORDER — ASPIRIN 81 MG PO CHEW: 324.0000 mg | CHEWABLE_TABLET | Freq: Every day | ORAL | Status: DC

## 2013-01-27 MED ORDER — DEXMEDETOMIDINE HCL IN NACL 200 MCG/50ML IV SOLN
0.1000 ug/kg/h | INTRAVENOUS | Status: DC
  Administered 2013-01-27: 0.5 ug/kg/h via INTRAVENOUS

## 2013-01-27 MED ORDER — ACETAMINOPHEN 10 MG/ML IV SOLN
1000.0000 mg | Freq: Once | INTRAVENOUS | Status: AC
  Administered 2013-01-27: 1000 mg via INTRAVENOUS
  Filled 2013-01-27: qty 100

## 2013-01-27 MED ORDER — VANCOMYCIN HCL IN DEXTROSE 1-5 GM/200ML-% IV SOLN
1000.0000 mg | Freq: Once | INTRAVENOUS | Status: AC
  Administered 2013-01-27: 1000 mg via INTRAVENOUS
  Filled 2013-01-27: qty 200

## 2013-01-27 SURGICAL SUPPLY — 147 items
ADAPTER CARDIO PERF ANTE/RETRO (ADAPTER) ×3 IMPLANT
ADPR PRFSN 84XANTGRD RTRGD (ADAPTER) ×2
APL SKNCLS STERI-STRIP NONHPOA (GAUZE/BANDAGES/DRESSINGS) ×4
APPLIER CLIP 9.375 MED OPEN (MISCELLANEOUS)
APPLIER CLIP 9.375 SM OPEN (CLIP)
APR CLP MED 9.3 20 MLT OPN (MISCELLANEOUS)
APR CLP SM 9.3 20 MLT OPN (CLIP)
ATTRACTOMAT 16X20 MAGNETIC DRP (DRAPES) ×5 IMPLANT
BAG DECANTER FOR FLEXI CONT (MISCELLANEOUS) ×5 IMPLANT
BANDAGE ELASTIC 4 VELCRO ST LF (GAUZE/BANDAGES/DRESSINGS) ×3 IMPLANT
BANDAGE ELASTIC 6 VELCRO ST LF (GAUZE/BANDAGES/DRESSINGS) ×3 IMPLANT
BANDAGE GAUZE ELAST BULKY 4 IN (GAUZE/BANDAGES/DRESSINGS) ×3 IMPLANT
BASKET HEART (ORDER IN 25'S) (MISCELLANEOUS) ×1
BASKET HEART (ORDER IN 25S) (MISCELLANEOUS) ×2 IMPLANT
BENZOIN TINCTURE PRP APPL 2/3 (GAUZE/BANDAGES/DRESSINGS) ×4 IMPLANT
BLADE STERNUM SYSTEM 6 (BLADE) ×5 IMPLANT
BLADE SURG 11 STRL SS (BLADE) ×3 IMPLANT
BLADE SURG ROTATE 9660 (MISCELLANEOUS) ×1 IMPLANT
CANISTER SUCTION 2500CC (MISCELLANEOUS) ×5 IMPLANT
CANN PRFSN 3/8X14X24FR PCFC (MISCELLANEOUS)
CANN PRFSN 3/8XCNCT ST RT ANG (MISCELLANEOUS)
CANNULA FEM VENOUS REMOTE 22FR (CANNULA) ×1 IMPLANT
CANNULA GUNDRY RCSP 15FR (MISCELLANEOUS) ×3 IMPLANT
CANNULA PRFSN 3/8X14X24FR PCFC (MISCELLANEOUS) IMPLANT
CANNULA PRFSN 3/8XCNCT RT ANG (MISCELLANEOUS) IMPLANT
CANNULA VEN MTL TIP RT (MISCELLANEOUS)
CANNULA VENNOUS METAL TIP 20FR (CANNULA) ×1 IMPLANT
CATH CPB KIT OWEN (MISCELLANEOUS) ×3 IMPLANT
CATH THORACIC 28FR (CATHETERS) ×1 IMPLANT
CATH THORACIC 28FR RT ANG (CATHETERS) IMPLANT
CATH THORACIC 36FR (CATHETERS) ×3 IMPLANT
CATH THORACIC 36FR RT ANG (CATHETERS) ×3 IMPLANT
CLAMP ISOLATOR SYNERGY LG (MISCELLANEOUS) ×3 IMPLANT
CLIP APPLIE 9.375 MED OPEN (MISCELLANEOUS) IMPLANT
CLIP APPLIE 9.375 SM OPEN (CLIP) IMPLANT
CLIP FOGARTY SPRING 6M (CLIP) IMPLANT
CLIP RETRACTION 3.0MM CORONARY (MISCELLANEOUS) ×1 IMPLANT
CLIP TI MEDIUM 24 (CLIP) IMPLANT
CLIP TI WIDE RED SMALL 24 (CLIP) IMPLANT
CLOTH BEACON ORANGE TIMEOUT ST (SAFETY) ×5 IMPLANT
CONN 1/2X1/2X1/2  BEN (MISCELLANEOUS) ×1
CONN 1/2X1/2X1/2 BEN (MISCELLANEOUS) ×2 IMPLANT
CONN 3/8X1/2 ST GISH (MISCELLANEOUS) ×7 IMPLANT
CONN Y 3/8X3/8X3/8  BEN (MISCELLANEOUS)
CONN Y 3/8X3/8X3/8 BEN (MISCELLANEOUS) IMPLANT
COVER SURGICAL LIGHT HANDLE (MISCELLANEOUS) ×5 IMPLANT
CRADLE DONUT ADULT HEAD (MISCELLANEOUS) ×5 IMPLANT
DRAIN CHANNEL 32F RND 10.7 FF (WOUND CARE) ×5 IMPLANT
DRAPE CARDIOVASCULAR INCISE (DRAPES) ×3
DRAPE SLUSH/WARMER DISC (DRAPES) ×5 IMPLANT
DRAPE SRG 135X102X78XABS (DRAPES) ×2 IMPLANT
DRSG COVADERM 4X14 (GAUZE/BANDAGES/DRESSINGS) ×5 IMPLANT
ELECT BLADE 4.0 EZ CLEAN MEGAD (MISCELLANEOUS) ×3
ELECT REM PT RETURN 9FT ADLT (ELECTROSURGICAL) ×12
ELECTRODE BLDE 4.0 EZ CLN MEGD (MISCELLANEOUS) IMPLANT
ELECTRODE REM PT RTRN 9FT ADLT (ELECTROSURGICAL) ×8 IMPLANT
GLOVE BIO SURGEON STRL SZ 6 (GLOVE) ×4 IMPLANT
GLOVE BIO SURGEON STRL SZ 6.5 (GLOVE) ×3 IMPLANT
GLOVE BIO SURGEON STRL SZ7 (GLOVE) IMPLANT
GLOVE BIO SURGEON STRL SZ7.5 (GLOVE) IMPLANT
GLOVE BIOGEL PI IND STRL 6 (GLOVE) IMPLANT
GLOVE BIOGEL PI IND STRL 6.5 (GLOVE) IMPLANT
GLOVE BIOGEL PI IND STRL 7.0 (GLOVE) IMPLANT
GLOVE BIOGEL PI INDICATOR 6 (GLOVE) ×2
GLOVE BIOGEL PI INDICATOR 6.5 (GLOVE) ×1
GLOVE BIOGEL PI INDICATOR 7.0 (GLOVE)
GLOVE EUDERMIC 7 POWDERFREE (GLOVE) IMPLANT
GLOVE ORTHO TXT STRL SZ7.5 (GLOVE) ×6 IMPLANT
GOWN STRL NON-REIN LRG LVL3 (GOWN DISPOSABLE) ×25 IMPLANT
HEMOSTAT POWDER SURGIFOAM 1G (HEMOSTASIS) ×15 IMPLANT
INSERT FOGARTY 61MM (MISCELLANEOUS) IMPLANT
INSERT FOGARTY XLG (MISCELLANEOUS) ×5 IMPLANT
KIT BASIN OR (CUSTOM PROCEDURE TRAY) ×5 IMPLANT
KIT DILATOR VASC 18G NDL (KITS) ×1 IMPLANT
KIT DRAINAGE VACCUM ASSIST (KITS) ×1 IMPLANT
KIT ROOM TURNOVER OR (KITS) ×6 IMPLANT
KIT SUCTION CATH 14FR (SUCTIONS) ×20 IMPLANT
KIT VASOVIEW W/TROCAR VH 2000 (KITS) ×3 IMPLANT
LEAD PACING MYOCARDI (MISCELLANEOUS) ×3 IMPLANT
LINE VENT (MISCELLANEOUS) ×1 IMPLANT
LOOP VESSEL SUPERMAXI WHITE (MISCELLANEOUS) ×3 IMPLANT
MARKER GRAFT CORONARY BYPASS (MISCELLANEOUS) ×9 IMPLANT
MARKER SKIN DUAL TIP RULER LAB (MISCELLANEOUS) ×1 IMPLANT
NS IRRIG 1000ML POUR BTL (IV SOLUTION) ×26 IMPLANT
PACK OPEN HEART (CUSTOM PROCEDURE TRAY) ×6 IMPLANT
PAD ARMBOARD 7.5X6 YLW CONV (MISCELLANEOUS) ×7 IMPLANT
PENCIL BUTTON HOLSTER BLD 10FT (ELECTRODE) ×3 IMPLANT
PROBE CRYO2-ABLATION MALLABLE (MISCELLANEOUS) ×1 IMPLANT
PUNCH AORTIC ROTATE 4.0MM (MISCELLANEOUS) IMPLANT
PUNCH AORTIC ROTATE 4.5MM 8IN (MISCELLANEOUS) ×1 IMPLANT
PUNCH AORTIC ROTATE 5MM 8IN (MISCELLANEOUS) IMPLANT
SET CARDIOPLEGIA MPS 5001102 (MISCELLANEOUS) ×1 IMPLANT
SET IRRIG TUBING LAPAROSCOPIC (IRRIGATION / IRRIGATOR) ×2 IMPLANT
SOLUTION ANTI FOG 6CC (MISCELLANEOUS) IMPLANT
SPONGE GAUZE 4X4 12PLY (GAUZE/BANDAGES/DRESSINGS) ×10 IMPLANT
SPONGE LAP 18X18 X RAY DECT (DISPOSABLE) ×1 IMPLANT
SPONGE LAP 4X18 X RAY DECT (DISPOSABLE) ×1 IMPLANT
STRIP CLOSURE SKIN 1/2X4 (GAUZE/BANDAGES/DRESSINGS) ×1 IMPLANT
SUCKER INTRACARDIAC WEIGHTED (SUCKER) ×3 IMPLANT
SUT BONE WAX W31G (SUTURE) ×6 IMPLANT
SUT ETHIBOND 2 0 SH (SUTURE)
SUT ETHIBOND 2 0 SH 36X2 (SUTURE) ×4 IMPLANT
SUT ETHIBOND X763 2 0 SH 1 (SUTURE) ×12 IMPLANT
SUT MNCRL AB 3-0 PS2 18 (SUTURE) ×11 IMPLANT
SUT MNCRL AB 4-0 PS2 18 (SUTURE) ×1 IMPLANT
SUT PDS AB 1 CTX 36 (SUTURE) ×10 IMPLANT
SUT PROLENE 2 0 SH DA (SUTURE) IMPLANT
SUT PROLENE 3 0 SH 1 (SUTURE) ×2 IMPLANT
SUT PROLENE 3 0 SH DA (SUTURE) ×4 IMPLANT
SUT PROLENE 3 0 SH1 36 (SUTURE) IMPLANT
SUT PROLENE 4 0 RB 1 (SUTURE) ×9
SUT PROLENE 4 0 SH DA (SUTURE) ×5 IMPLANT
SUT PROLENE 4-0 RB1 .5 CRCL 36 (SUTURE) ×4 IMPLANT
SUT PROLENE 5 0 C 1 36 (SUTURE) IMPLANT
SUT PROLENE 6 0 C 1 30 (SUTURE) ×4 IMPLANT
SUT PROLENE 7.0 RB 3 (SUTURE) ×9 IMPLANT
SUT PROLENE 8 0 BV175 6 (SUTURE) IMPLANT
SUT PROLENE BLUE 7 0 (SUTURE) ×3 IMPLANT
SUT PROLENE POLY MONO (SUTURE) IMPLANT
SUT SILK  1 MH (SUTURE) ×2
SUT SILK 1 MH (SUTURE) ×4 IMPLANT
SUT STEEL 6MS V (SUTURE) ×2 IMPLANT
SUT STEEL STERNAL CCS#1 18IN (SUTURE) IMPLANT
SUT STEEL SZ 6 DBL 3X14 BALL (SUTURE) ×1 IMPLANT
SUT VIC AB 1 CTX 36 (SUTURE)
SUT VIC AB 1 CTX36XBRD ANBCTR (SUTURE) IMPLANT
SUT VIC AB 2-0 CT1 27 (SUTURE) ×3
SUT VIC AB 2-0 CT1 TAPERPNT 27 (SUTURE) IMPLANT
SUT VIC AB 2-0 CTX 27 (SUTURE) IMPLANT
SUT VIC AB 2-0 CTX 36 (SUTURE) ×1 IMPLANT
SUT VIC AB 2-0 UR6 27 (SUTURE) ×1 IMPLANT
SUT VIC AB 3-0 SH 27 (SUTURE)
SUT VIC AB 3-0 SH 27X BRD (SUTURE) IMPLANT
SUT VIC AB 3-0 X1 27 (SUTURE) IMPLANT
SUT VICRYL 4-0 PS2 18IN ABS (SUTURE) IMPLANT
SUTURE E-PAK OPEN HEART (SUTURE) ×3 IMPLANT
SYS ATRICLIP LAA EXCLUSION 45 (CLIP) ×1 IMPLANT
SYSTEM SAHARA CHEST DRAIN ATS (WOUND CARE) ×5 IMPLANT
TAPE CLOTH SURG 4X10 WHT LF (GAUZE/BANDAGES/DRESSINGS) ×1 IMPLANT
TAPE PAPER 2X10 WHT MICROPORE (GAUZE/BANDAGES/DRESSINGS) ×1 IMPLANT
TOWEL OR 17X24 6PK STRL BLUE (TOWEL DISPOSABLE) ×10 IMPLANT
TOWEL OR 17X26 10 PK STRL BLUE (TOWEL DISPOSABLE) ×10 IMPLANT
TRAY FOLEY IC TEMP SENS 14FR (CATHETERS) ×5 IMPLANT
TUBE SUCT INTRACARD DLP 20F (MISCELLANEOUS) ×3 IMPLANT
TUBING INSUFFLATION 10FT LAP (TUBING) ×3 IMPLANT
UNDERPAD 30X30 INCONTINENT (UNDERPADS AND DIAPERS) ×5 IMPLANT
WATER STERILE IRR 1000ML POUR (IV SOLUTION) ×10 IMPLANT

## 2013-01-27 NOTE — Anesthesia Postprocedure Evaluation (Signed)
Anesthesia Post Note  Patient: [name]  Procedure(s) Performed: Procedure(s) (LRB): CORONARY ARTERY BYPASS GRAFTING (CABG) x  two; using left internal mammary artery and right leg greater saphenous vein harvested endoscopically ([name]) MAZE ([name]) INTRAOPERATIVE TRANSESOPHAGEAL ECHOCARDIOGRAM ([name])  Anesthesia type: General  Patient location: ICU  Post pain: Pain level controlled  Post assessment: Post-op Vital signs reviewed  Last Vitals:  Filed Vitals:   01/27/13 1515  BP:   Pulse: 80  Temp: 36.8 C  Resp: 12    Post vital signs: stable  Level of consciousness: Patient remains intubated per anesthesia plan  Complications: No apparent anesthesia complications

## 2013-01-27 NOTE — Progress Notes (Signed)
  Echocardiogram Echocardiogram Transesophageal has been performed.  [name] A 01/27/2013, 8:43 AM

## 2013-01-27 NOTE — Procedures (Signed)
Extubation Procedure Note  Patient Details:   Name: [name] DOB: [birth year] MRN: [id number]   Airway Documentation:     Evaluation  O2 sats: stable throughout and currently acceptable Complications: No apparent complications Patient did tolerate procedure well. Bilateral Breath Sounds: Clear   Yes Pt awake and alert. Extubated per protocol, placed on 6L [place], sat 92%. Positive cuff leak, NIF -20, VC 700, IS 600. Pt extubated per MD even though po2 59.  Pt able to vocalize.  Arloa Koh 01/27/2013, 7:01 PM

## 2013-01-27 NOTE — Interval H&P Note (Signed)
History and Physical Interval Note:  01/27/2013 6:56 AM  [name]  has presented today for surgery, with the diagnosis of CAD A-FIB  The various methods of treatment have been discussed with the patient and family. After consideration of risks, benefits and other options for treatment, the patient has consented to  Procedure(s): CORONARY ARTERY BYPASS GRAFTING (CABG) ([name]) MAZE ([name]) INTRAOPERATIVE TRANSESOPHAGEAL ECHOCARDIOGRAM ([name]) as a surgical intervention .  The patient's history has been reviewed, patient examined, no change in status, stable for surgery.  I have reviewed the patient's chart and labs.  Questions were answered to the patient's satisfaction.     Mubashir Mallek H

## 2013-01-27 NOTE — Transfer of Care (Signed)
Immediate Anesthesia Transfer of Care Note  Patient: [name]  Procedure(s) Performed: Procedure(s): CORONARY ARTERY BYPASS GRAFTING (CABG) x  two; using left internal mammary artery and right leg greater saphenous vein harvested endoscopically ([name]) MAZE ([name]) INTRAOPERATIVE TRANSESOPHAGEAL ECHOCARDIOGRAM ([name])  Patient Location: ICU  Anesthesia Type:General  Level of Consciousness: Patient remains intubated per anesthesia plan  Airway & Oxygen Therapy: Patient remains intubated per anesthesia plan and Patient placed on Ventilator (see vital sign flow sheet for setting)  Post-op Assessment: Report given to PACU RN  Post vital signs: Reviewed and stable  Complications: No apparent anesthesia complications

## 2013-01-27 NOTE — Progress Notes (Signed)
S/p CABG + maze  Intubated, sedated  BP 90/55  Pulse 80  Temp(Src) 98.4 F (36.9 C) (Oral)  Resp 13  Wt 242 lb 8.1 oz (110 kg)  BMI 35.8 kg/m2  SpO2 100%   Intake/Output Summary (Last 24 hours) at 01/27/13 1712 Last data filed at 01/27/13 1600  Gross per 24 hour  Intake 4161.6 ml  Output   2045 ml  Net 2116.6 ml    CI low- receiving 3rd albumin  Ap @ 80- turned to 90  Will reastart low dose dopamine until index improves

## 2013-01-27 NOTE — Brief Op Note (Addendum)
  01/27/2013  12:13 PM  PATIENT:  [name]  72 y.o. male  PRE-OPERATIVE DIAGNOSIS:  CAD, A-FIB  POST-OPERATIVE DIAGNOSIS:  CAD, A-FIB  PROCEDURE:   CORONARY ARTERY BYPASS GRAFTING  x  2 (LIMA-LAD, SVG-dRCA, EVH from R thigh) MAZE PROCEDURE  (complete bilateral lesion set using bipolar RF and cryothermy w/ clipping of LA appendage)  SURGEON:    Purcell Nails, MD  ASSISTANTS:  Coral Ceo, PA-C  ANESTHESIA:   Aubery Lapping, MD  CROSSCLAMP TIME:   99'  CARDIOPULMONARY BYPASS TIME: 136'  FINDINGS:  Mild-moderate global LV dysfunction, EF 40-45%  Good quality LIMA and SVG conduit for grafting  Good quality target vessels for grafting  PRE-OPERATIVE WEIGHT: 110 kg  COMPLICATIONS: none  PATIENT DISPOSITION:   TO SICU IN STABLE CONDITION  [name],[name] 01/27/2013 1:39 PM

## 2013-01-27 NOTE — Preoperative (Signed)
Beta Blockers   Reason not to administer Beta Blockers:Not Applicable 

## 2013-01-27 NOTE — Op Note (Signed)
CARDIOTHORACIC SURGERY OPERATIVE NOTE  Date of Procedure:   01/27/2013  Preoperative Diagnosis:   Severe 2-vessel Coronary Artery Disease  Recurrent Persistent Atrial Fibrillation  Postoperative Diagnosis: Same  Procedure:   Coronary Artery Bypass Grafting x 2   Left Internal Mammary Artery to Distal Left Anterior Descending Coronary Artery  Saphenous Vein Graft to Distal Right Coronary Artery  Endoscopic Vein Harvest from Right Thigh  Maze Procedure   complete biatrial lesion set with clipping of left atrial appendage   Surgeon: Salvatore Decent. Cornelius Moras, MD  Assistant: Coral Ceo, PA-C  Anesthesia: Aubery Lapping, MD  Operative Findings:  Mild-moderate global LV dysfunction, EF 40-45%  Good quality LIMA and SVG conduit for grafting  Good quality target vessels for grafting     BRIEF CLINICAL NOTE AND INDICATIONS FOR SURGERY  Patient is a 72 year old obese white male with history of coronary artery disease with ischemic cardiomyopathy, persistent atrial fibrillation, type 2 diabetes mellitus, hyperlipidemia, and obstructive sleep apnea.  In 2008 the patient presented with an acute inferior wall myocardial infarction. He was treated with PCI and stenting of the right coronary artery using a bare-metal stent. He has been followed intermittently by Dr. Excell Seltzer ever since. This past November he presented for follow up noting that his pulse rate was elevated and irregular.  He was seen by Norma Fredrickson who noted that he was in atrial fibrillation.  His beta blocker was increased for rate control and he was started on Xarelto.  An echocardiogram was performed demonstrating ejection fraction 35-40%.  The left atrium was enlarged but there were no significant valvular abnormalities, but there were segmental wall motion abnormalities including left anterolateral hypokinesis and severe hypokinesis of the inferoposterior wall.  He underwent DC cardioversion on 11/30/2012. Unfortunately he  went back into atrial fibrillation by the time he was seen in follow up on 12/09/2012.  Because of his history of two-vessel coronary artery disease, followup catheterization was performed 01/17/2013.  This revealed significant progression of disease in the left anterior descending coronary artery with moderate right coronary artery stenosis. The patient was referred for surgical consultation to consider coronary artery bypass grafting with Maze procedure.  The patient has been seen in consultation and counseled at length regarding the indications, risks and potential benefits of surgery.  All questions have been answered, and the patient provides full informed consent for the operation as described.     DETAILS OF THE OPERATIVE PROCEDURE  The patient is brought to the operating room on the above mentioned date and central monitoring was established by the anesthesia team including placement of Swan-Ganz catheter and radial arterial line. The patient is placed in the supine position on the operating table.  Intravenous antibiotics are administered. General endotracheal anesthesia is induced uneventfully. A Foley catheter is placed.  Baseline transesophageal echocardiogram was performed.  Findings were notable for mild to moderate global LV dysfunction with EF estimated 40-45%.  There was mild mitral regurgitation.  No other abnormalities were noted.  The patient's chest, abdomen, both groins, and both lower extremities are prepared and draped in a sterile manner. A time out procedure is performed.  A median sternotomy incision was performed and the pericardium is opened. The ascending aorta is normal in appearance. The right common femoral vein is cannulated using the Seldinger technique and a guidewire advanced into the right atrium using TEE guidance.  The patient is heparinized systemically and the femoral vein cannulated using a 22 Fr long femoral venous cannula.  The ascending  aorta is cannulated for  cardiopulmonary bypass.  Adequate heparinization is verified.   A retrograde cardioplegia cannula is placed through the right atrium into the coronary sinus.   The entire pre-bypass portion of the operation was notable for stable hemodynamics.  Cardiopulmonary bypass was begun and the surface of the heart is inspected.  A second venous cannula is placed directly into the superior vena cava.   A cardioplegia cannula is placed in the ascending aorta.  A temperature probe was placed in the interventricular septum.  The patient is cooled to 32C systemic temperature.  The aortic cross clamp is applied and cold blood cardioplegia is delivered initially in an antegrade fashion through the aortic root.   Supplemental cardioplegia is given retrograde through the coronary sinus catheter.  Iced saline slush is applied for topical hypothermia.  The initial cardioplegic arrest is rapid with early diastolic arrest.  Repeat doses of cardioplegia are administered intermittently throughout the entire cross clamp portion of the operation through the aortic root, through the subsequently placed vein graft, and through the coronary sinus catheter in order to maintain completely flat electrocardiogram and septal myocardial temperature below 15C.  Myocardial protection was felt to be excellent.  The Atricure bipolar radiofrequency ablation clamp is used for all radiofrequency ablation lesions for the maze procedure, and the Atricure cryothermy probe used for all cryothermy lesions.  The heart is retracted towards the surgeon's side and the left sided pulmonary veins exposed.  An elliptical ablation lesion is created around the base of the left sided pulmonary veins.  A similar elliptical lesion was created around the base of the left atrial appendage.  The left atrial appendage was obliterated by deploying a 45 mm Atricure left atrial appendage clip across the appendage.  The heart was replaced into the pericardial  sac.  The following distal coronary artery bypass grafts were performed:   The distal right coronary artery was grafted using a reversed saphenous vein graft in an end-to-side fashion.  At the site of distal anastomosis the target vessel was good quality and measured approximately 2.0 mm in diameter.  The distal left anterior coronary artery was grafted with the left internal mammary artery in an end-to-side fashion.  At the site of distal anastomosis the target vessel was good quality and measured approximately 2.0 mm in diameter.  A left atriotomy incision was performed through the interatrial groove and extended partially across the back wall of the left atrium after opening the oblique sinus inferiorly.  The floor of the left atrium and the mitral valve were exposed using a self-retaining retractor.    An ablation lesion was placed around the right sided pulmonary veins using the bipolar clamp with one limb of the clamp along the endocardial surface and one along the epicardial surface posteriorly.  A bipolar ablation lesion was placed across the dome of the left atrium from the cephalad apex of the atriotomy incision to reach the cephalad apex of the elliptical lesion around the left sided pulmonary veins.  A similar bipolar lesion was placed across the back wall of the left atrium from the caudad apex of the atriotomy incision to reach the caudad apex of the elliptical lesion around the left sided pulmonary veins, thereby completing a box.  Finally another bipolar lesion was placed across the back wall of the left atrium from the caudad apex of the atriotomy incision towards the posterior mitral valve annulus.  This lesion was completed along the endocardial surface onto the posterior mitral  annulus with a 3 minute duration cryothermy lesion, followed by a second cryothermy lesion along the posterior epicardial surface of the left atrium to the coronary sinus.  This completes the entire left side  lesion set of the Cox maze procedure.  The atriotomy was closed using a 2-layer closure of running 3-0 Prolene suture after placing a sump drain across the mitral valve to serve as a left ventricular vent.  The single proximal saphenous vein anastomosis was placed directly to the ascending aorta prior to removal of the aortic crossclamp.  The aortic cross clamp was removed after a total cross clamp time of 99 minutes.  The femoral venous cannula is pulled back until the tip is just outside of the right atrium in the inferior vena cava. A retrograde cardioplegia cannula is removed. An oblique right atriotomy incision is performed. The bipolar radiofrequency ablation clamp is utilized to create a linear ablation lesions along the lateral wall of the right atrium extending from the posterior apex of the atriotomy incision to reach the posterior lateral superior vena cava just above the junction with the right atrium. A similar lesion is placed in the opposite direction to reach the lateral wall of the inferior vena cava. One final bipolar lesion is then placed from the midportion of the atriotomy incision directed to the tip of the right atrial appendage. Finally, the cryotherapy probe is placed from the anterior apex of the right atriotomy incision near the acute margin of the heart along the endocardial surface to reach the tricuspid annulus at the 2 o'clock position. This completes the right side lesion set of the Cox maze IV procedure.  Epicardial pacing wires are fixed to the right ventricular outflow tract and to the right atrial appendage. The patient is rewarmed to 37C temperature. The aortic and left ventricular vents are removed.  The patient is weaned and disconnected from cardiopulmonary bypass.  The patient's rhythm at separation from bypass was AV paced.  The patient was weaned from cardioplegic bypass without any inotropic support. Total cardiopulmonary bypass time for the operation was 136  minutes.  Followup transesophageal echocardiogram performed after separation from bypass revealed left ventricular function was unchanged from preoperatively.  The aortic and superior vena cava cannula were removed uneventfully. Protamine was administered to reverse the anticoagulation. The femoral venous cannula was removed and manual pressure held on the groin for 30 minutes.  The mediastinum and pleural space were inspected for hemostasis and irrigated with saline solution. The mediastinum and left pleural space were drained using 3 chest tubes placed through separate stab incisions inferiorly.  The soft tissues anterior to the aorta were reapproximated loosely. The sternum is closed with double strength sternal wire. The soft tissues anterior to the sternum were closed in multiple layers and the skin is closed with a running subcuticular skin closure.  The patient tolerated the procedure well and is transported to the surgical intensive care in stable condition. There are no intraoperative complications. All sponge instrument and needle counts are verified correct at completion of the operation.   The post-bypass portion of the operation was notable for stable rhythm and hemodynamics.   No blood products were administered during the operation.   Salvatore Decent. Cornelius Moras MD 01/27/2013 2:22 PM

## 2013-01-27 NOTE — Anesthesia Preprocedure Evaluation (Signed)
Anesthesia Evaluation  Patient identified by MRN, date of birth, ID band Patient awake    Reviewed: Allergy & Precautions, H&P , NPO status , Patient's Chart, lab work & pertinent test results, reviewed documented beta blocker date and time   Airway Mallampati: II TM Distance: >3 FB Neck ROM: Full    Dental  (+) Teeth Intact and Partial Upper   Pulmonary sleep apnea , former smoker,    Pulmonary exam normal       Cardiovascular hypertension, Pt. on medications and Pt. on home beta blockers + CAD and + Past MI + dysrhythmias Atrial Fibrillation Rhythm:Irregular     Neuro/Psych    GI/Hepatic   Endo/Other  diabetes, Well Controlled, Type 2, Oral Hypoglycemic Agents  Renal/GU      Musculoskeletal   Abdominal   Peds  Hematology   Anesthesia Other Findings   Reproductive/Obstetrics                           Anesthesia Physical Anesthesia Plan  ASA: IV  Anesthesia Plan: General   Post-op Pain Management:    Induction: Intravenous  Airway Management Planned: Oral ETT  Additional Equipment: Arterial line, CVP, PA Cath, 3D TEE and Ultrasound Guidance Line Placement  Intra-op Plan:   Post-operative Plan: Extubation in OR  Informed Consent: I have reviewed the patients History and Physical, chart, labs and discussed the procedure including the risks, benefits and alternatives for the proposed anesthesia with the patient or authorized representative who has indicated his/her understanding and acceptance.   Dental advisory given  Plan Discussed with: CRNA, Anesthesiologist and Surgeon  Anesthesia Plan Comments:         Anesthesia Quick Evaluation

## 2013-01-28 ENCOUNTER — Inpatient Hospital Stay (HOSPITAL_COMMUNITY): Payer: Medicare Other

## 2013-01-28 ENCOUNTER — Encounter (HOSPITAL_COMMUNITY): Payer: Self-pay | Admitting: Thoracic Surgery (Cardiothoracic Vascular Surgery)

## 2013-01-28 LAB — CBC
HCT: 37.1 % — ABNORMAL LOW (ref 39.0–52.0)
HCT: 35.7 % — ABNORMAL LOW (ref 39.0–52.0)
Hemoglobin: 13.2 g/dL (ref 13.0–17.0)
Hemoglobin: 12.6 g/dL — ABNORMAL LOW (ref 13.0–17.0)
MCH: 31.5 pg (ref 26.0–34.0)
MCH: 31.5 pg (ref 26.0–34.0)
MCHC: 35.6 g/dL (ref 30.0–36.0)
MCHC: 35.3 g/dL (ref 30.0–36.0)
MCV: 88.5 fL (ref 78.0–100.0)
MCV: 89.3 fL (ref 78.0–100.0)
Platelets: 121 10*3/uL — ABNORMAL LOW (ref 150–400)
Platelets: 106 10*3/uL — ABNORMAL LOW (ref 150–400)
RBC: 4.19 MIL/uL — ABNORMAL LOW (ref 4.22–5.81)
RBC: 4 MIL/uL — ABNORMAL LOW (ref 4.22–5.81)
RDW: 13.6 % (ref 11.5–15.5)
RDW: 13.8 % (ref 11.5–15.5)
WBC: 15.7 10*3/uL — ABNORMAL HIGH (ref 4.0–10.5)
WBC: 16.9 10*3/uL — ABNORMAL HIGH (ref 4.0–10.5)

## 2013-01-28 LAB — POCT I-STAT, CHEM 8
BUN: 20 mg/dL (ref 6–23)
BUN: 24 mg/dL — ABNORMAL HIGH (ref 6–23)
Calcium, Ion: 1.18 mmol/L (ref 1.13–1.30)
Calcium, Ion: 1.18 mmol/L (ref 1.13–1.30)
Chloride: 109 mEq/L (ref 96–112)
Chloride: 101 mEq/L (ref 96–112)
Creatinine, Ser: 1.2 mg/dL (ref 0.50–1.35)
Creatinine, Ser: 1.6 mg/dL — ABNORMAL HIGH (ref 0.50–1.35)
Glucose, Bld: 157 mg/dL — ABNORMAL HIGH (ref 70–99)
Glucose, Bld: 133 mg/dL — ABNORMAL HIGH (ref 70–99)
HCT: 36 % — ABNORMAL LOW (ref 39.0–52.0)
HCT: 36 % — ABNORMAL LOW (ref 39.0–52.0)
Hemoglobin: 12.2 g/dL — ABNORMAL LOW (ref 13.0–17.0)
Hemoglobin: 12.2 g/dL — ABNORMAL LOW (ref 13.0–17.0)
Potassium: 4.1 mEq/L (ref 3.5–5.1)
Potassium: 4.2 mEq/L (ref 3.5–5.1)
Sodium: 140 mEq/L (ref 135–145)
Sodium: 134 mEq/L — ABNORMAL LOW (ref 135–145)
TCO2: 22 mmol/L (ref 0–100)
TCO2: 25 mmol/L (ref 0–100)

## 2013-01-28 LAB — MAGNESIUM
Magnesium: 2.4 mg/dL (ref 1.5–2.5)
Magnesium: 2.5 mg/dL (ref 1.5–2.5)

## 2013-01-28 LAB — BASIC METABOLIC PANEL
BUN: 21 mg/dL (ref 6–23)
CO2: 25 mEq/L (ref 19–32)
Calcium: 8.7 mg/dL (ref 8.4–10.5)
Chloride: 105 mEq/L (ref 96–112)
Creatinine, Ser: 1.23 mg/dL (ref 0.50–1.35)
GFR calc Af Amer: 67 mL/min — ABNORMAL LOW (ref >90)
GFR calc non Af Amer: 58 mL/min — ABNORMAL LOW (ref >90)
Glucose, Bld: 125 mg/dL — ABNORMAL HIGH (ref 70–99)
Potassium: 4 mEq/L (ref 3.5–5.1)
Sodium: 137 mEq/L (ref 135–145)

## 2013-01-28 LAB — GLUCOSE, CAPILLARY
Glucose-Capillary: 123 mg/dL — ABNORMAL HIGH (ref 70–99)
Glucose-Capillary: 122 mg/dL — ABNORMAL HIGH (ref 70–99)
Glucose-Capillary: 104 mg/dL — ABNORMAL HIGH (ref 70–99)
Glucose-Capillary: 109 mg/dL — ABNORMAL HIGH (ref 70–99)
Glucose-Capillary: 100 mg/dL — ABNORMAL HIGH (ref 70–99)
Glucose-Capillary: 98 mg/dL (ref 70–99)
Glucose-Capillary: 134 mg/dL — ABNORMAL HIGH (ref 70–99)
Glucose-Capillary: 147 mg/dL — ABNORMAL HIGH (ref 70–99)
Glucose-Capillary: 130 mg/dL — ABNORMAL HIGH (ref 70–99)
Glucose-Capillary: 155 mg/dL — ABNORMAL HIGH (ref 70–99)

## 2013-01-28 LAB — CREATININE, SERUM
Creatinine, Ser: 1.56 mg/dL — ABNORMAL HIGH (ref 0.50–1.35)
GFR calc Af Amer: 51 mL/min — ABNORMAL LOW (ref >90)
GFR calc non Af Amer: 44 mL/min — ABNORMAL LOW (ref >90)

## 2013-01-28 MED ORDER — AMIODARONE HCL 200 MG PO TABS
200.0000 mg | ORAL_TABLET | Freq: Two times a day (BID) | ORAL | Status: DC
  Administered 2013-01-29 – 2013-01-30 (×4): 200 mg via ORAL
  Filled 2013-01-28 (×7): qty 1

## 2013-01-28 MED ORDER — INSULIN DETEMIR 100 UNIT/ML ~~LOC~~ SOLN
20.0000 [IU] | Freq: Two times a day (BID) | SUBCUTANEOUS | Status: DC
  Administered 2013-01-28 – 2013-01-30 (×5): 20 [IU] via SUBCUTANEOUS
  Filled 2013-01-28: qty 0.2

## 2013-01-28 MED ORDER — MIDAZOLAM HCL 2 MG/2ML IJ SOLN
2.0000 mg | Freq: Once | INTRAMUSCULAR | Status: AC
  Administered 2013-01-28: 2 mg via INTRAVENOUS
  Filled 2013-01-28: qty 2

## 2013-01-28 MED ORDER — INSULIN ASPART 100 UNIT/ML ~~LOC~~ SOLN
0.0000 [IU] | SUBCUTANEOUS | Status: DC
  Administered 2013-01-28 – 2013-01-29 (×5): 2 [IU] via SUBCUTANEOUS

## 2013-01-28 MED ORDER — SODIUM CHLORIDE 0.9 % IV SOLN
INTRAVENOUS | Status: AC
  Filled 2013-01-28: qty 1

## 2013-01-28 MED ORDER — INSULIN ASPART 100 UNIT/ML ~~LOC~~ SOLN: 0.0000 [IU] | SUBCUTANEOUS | Status: DC

## 2013-01-28 MED ORDER — FUROSEMIDE 10 MG/ML IJ SOLN
20.0000 mg | Freq: Four times a day (QID) | INTRAMUSCULAR | Status: AC
  Administered 2013-01-28 (×3): 20 mg via INTRAVENOUS
  Filled 2013-01-28 (×4): qty 2

## 2013-01-28 MED ORDER — INSULIN ASPART 100 UNIT/ML ~~LOC~~ SOLN
0.0000 [IU] | SUBCUTANEOUS | Status: AC
  Administered 2013-01-28: 2 [IU] via SUBCUTANEOUS

## 2013-01-28 MED ORDER — MORPHINE SULFATE 2 MG/ML IJ SOLN
2.0000 mg | INTRAMUSCULAR | Status: DC | PRN
  Administered 2013-01-28: 2 mg via INTRAVENOUS
  Filled 2013-01-28: qty 1

## 2013-01-28 MED ORDER — INSULIN REGULAR BOLUS VIA INFUSION
0.0000 [IU] | Freq: Three times a day (TID) | INTRAVENOUS | Status: AC
  Filled 2013-01-28: qty 10

## 2013-01-28 MED FILL — Magnesium Sulfate Inj 50%: INTRAMUSCULAR | Qty: 10 | Status: AC

## 2013-01-28 MED FILL — Potassium Chloride Inj 2 mEq/ML: INTRAVENOUS | Qty: 40 | Status: AC

## 2013-01-28 NOTE — Progress Notes (Signed)
Rn took pt off cpap, placed on 3L [place], sat 92%

## 2013-01-28 NOTE — Progress Notes (Signed)
[facility] BRIEF SICU PROGRESS NOTE  1 Day Post-Op  S/P Procedure(s) (LRB): CORONARY ARTERY BYPASS GRAFTING (CABG) x  two; using left internal mammary artery and right leg greater saphenous vein harvested endoscopically ([name]) MAZE ([name]) INTRAOPERATIVE TRANSESOPHAGEAL ECHOCARDIOGRAM ([name])   Stable day Mild soreness, esp with cough AAI paced w/ stable BP UOP adequate  Plan: Continue routine care  Dailey Buccheri H 01/28/2013 9:54 PM

## 2013-01-28 NOTE — Progress Notes (Addendum)
[facility] DAILY PROGRESS NOTE                   [address]            [name] [address]          [phone]      1 Day Post-Op Procedure(s) (LRB): CORONARY ARTERY BYPASS GRAFTING (CABG) x  two; using left internal mammary artery and right leg greater saphenous vein harvested endoscopically ([name]) MAZE ([name]) INTRAOPERATIVE TRANSESOPHAGEAL ECHOCARDIOGRAM ([name])  Total Length of Stay:  LOS: 1 day   Subjective: Feels well, "Like I'm ready for another day."  Some soreness with cough, no nausea.   Objective: Vital signs in last 24 hours: Temp:  [97.9 F (36.6 C)-98.6 F (37 C)] 98.6 F (37 C) (02/28 0700) Pulse Rate:  [79-90] 89 (02/28 0700) Cardiac Rhythm:  [-] Atrial paced (02/27 1930) Resp:  [10-22] 17 (02/28 0700) BP: (85-146)/(50-82) 104/64 mmHg (02/28 0700) SpO2:  [88 %-100 %] 94 % (02/28 0700) Arterial Line BP: (92-147)/(45-88) 136/51 mmHg (02/28 0700) FiO2 (%):  [40 %-50 %] 40 % (02/27 1830)  Filed Weights   01/26/13 1300  Weight: 242 lb 8.1 oz (110 kg)    Weight change:    Hemodynamic parameters for last 24 hours: PAP: (22-48)/(10-31) 26/10 mmHg CO:  [3 L/min-6.1 L/min] 5.8 L/min CI:  [1.3 L/min/m2-2.7 L/min/m2] 2.6 L/min/m2  Intake/Output from previous day: 02/27 0701 - 02/28 0700 In: 6504.7 [I.V.:4827.7; Blood:427; IV Piggyback:1250] Out: 3790 [Urine:2600; Blood:700; Chest Tube:340]  CBGs: 180-133-157-123-125     Current Meds: Scheduled Meds: . acetaminophen  1,000 mg Oral Q6H   Or  . acetaminophen (TYLENOL) oral liquid 160 mg/5 mL  975 mg Per Tube Q6H  . aspirin EC  325 mg Oral Daily   Or  . aspirin  324 mg Per Tube Daily  . bisacodyl  10 mg Oral Daily   Or  . bisacodyl  10 mg Rectal Daily  . cefUROXime (ZINACEF)  IV  1.5 g Intravenous Q12H  . docusate sodium  200 mg Oral Daily  . insulin regular  0-10 Units Intravenous TID WC  . metoprolol tartrate  12.5 mg Oral BID   Or  . metoprolol tartrate  12.5 mg Per Tube BID  . [START  ON 01/29/2013] pantoprazole  40 mg Oral Daily  . sodium chloride  3 mL Intravenous Q12H   Continuous Infusions: . sodium chloride 10 mL/hr at 01/27/13 1400  . sodium chloride 20 mL/hr at 01/27/13 1400  . dexmedetomidine Stopped (01/27/13 1730)  . DOPamine 3 mcg/kg/min (01/28/13 0700)  . insulin (NOVOLIN-R) infusion 6.2 mL/hr at 01/28/13 0700  . lactated ringers 20 mL/hr at 01/27/13 1400  . nitroGLYCERIN Stopped (01/27/13 1900)  . phenylephrine (NEO-SYNEPHRINE) Adult infusion 5 mcg/min (01/28/13 0700)   PRN Meds:.sodium chloride, albumin human, metoprolol, morphine injection, ondansetron (ZOFRAN) IV, oxyCODONE, sodium chloride   Physical Exam: General appearance: alert, cooperative and no distress Heart: regular rate and rhythm, S1, S2 normal, no murmur, click, rub or gallop Lungs: diminished breath sounds in bases bilaterally Extremities: trace LE edema Wound: Dressed and dry    Lab Results: CBC: Recent Labs  01/27/13 2000 01/28/13 0405  WBC 15.7* 15.7*  HGB 13.7 13.2  HCT 38.5* 37.1*  PLT 128* 121*   BMET:  Recent Labs  01/25/13 1530  01/27/13 1958 01/27/13 2000 01/28/13 0405  NA 137  < > 140  --  137  K 3.9  < > 4.1  --  4.0  CL 101  --  109  --  105  CO2 24  --   --   --  25  GLUCOSE 118*  < > 157*  --  125*  BUN 17  --  20  --  21  CREATININE 1.28  --  1.20 1.23 1.23  CALCIUM 9.4  --   --   --  8.7  < > = values in this interval not displayed.  PT/INR:  Recent Labs  01/27/13 1430  LABPROT 15.6*  INR 1.27   Radiology: Dg Chest Portable 1 View  01/27/2013  *RADIOLOGY REPORT*  Clinical Data: Postoperative film after open-heart surgery.  PORTABLE CHEST - 1 VIEW  Comparison: Chest x-ray 01/25/2013.  Findings: An endotracheal tube is in place with tip 1.6 cm above the carina. Right IJ Cordis through which a Theone Murdoch catheter has been passed into the right main pulmonary artery.  A left-sided chest tube and mediastinal/pericardial drains are noted.  A  nasogastric tube is seen extending into the stomach, however, the tip of the nasogastric tube extends below the lower margin of the image. Postoperative changes of median sternotomy for CABG.  Lung volumes are low.  There are opacities in the lung bases bilaterally and the left mid lung, which likely reflect postoperative subsegmental atelectasis.  Small bilateral pleural effusions (left greater than right).  Mild enlargement of the cardiopericardial silhouette and widening of the mediastinal contours, within normal limits for the immediate postoperative state.  Atherosclerosis in the thoracic aorta.  IMPRESSION: 1.  Postoperative changes and support apparatus, as above.  Take note of the low lying position of the endotracheal tube and consider withdrawal approximately 2-3 cm for more optimal placement. 2.  Low lung volumes with patchy areas of postoperative subsegmental atelectasis, and small bilateral pleural effusions, as above.  These results will be called to the ordering clinician or representative by the Radiologist Assistant, and communication documented in the PACS Dashboard.   Original Report Authenticated By: Trudie Reed, M.D.    01/28/13 Findings: Endotracheal tube and nasogastric tube have been removed.  Left chest tube and mediastinal drain remain in place. Swan-Ganz  catheter remains in place with its tip in the right main pulmonary  artery. There is no pneumothorax. There is mild basilar  atelectasis.  IMPRESSION:  Mild basilar atelectasis following extubation. No pneumothorax.   Assessment/Plan: S/P Procedure(s) (LRB): CORONARY ARTERY BYPASS GRAFTING (CABG) x  two; using left internal mammary artery and right leg greater saphenous vein harvested endoscopically ([name]) MAZE ([name]) INTRAOPERATIVE TRANSESOPHAGEAL ECHOCARDIOGRAM ([name])  CV- Currently only on low dose Dopamine gtt.  BPs stable.  Paced at 80 bpm. EKG shows junctional rhythm in 40s. Continue current care.    DM-  Will  wean insulin gtt and start Levemir. Resume po meds once po intake improves.  Vol overload- diurese.  Pulm- increase pulm toilet/IS.  Mobilize, d/c lines and tubes, routine postop day 1 progression.     COLLINS,GINA H 01/28/2013 7:43 AM  I have seen and examined the patient and agree with the assessment and plan as outlined. Doing very well POD1. D/C beta blocker and continue AAI pacing. Resume low dose amiodarone.  Leyli Kevorkian H 01/28/2013 9:16 AM

## 2013-01-29 ENCOUNTER — Inpatient Hospital Stay (HOSPITAL_COMMUNITY): Payer: Medicare Other

## 2013-01-29 LAB — CBC
HCT: 33.8 % — ABNORMAL LOW (ref 39.0–52.0)
Hemoglobin: 11.8 g/dL — ABNORMAL LOW (ref 13.0–17.0)
MCH: 31.4 pg (ref 26.0–34.0)
MCHC: 34.9 g/dL (ref 30.0–36.0)
MCV: 89.9 fL (ref 78.0–100.0)
Platelets: 96 10*3/uL — ABNORMAL LOW (ref 150–400)
RBC: 3.76 MIL/uL — ABNORMAL LOW (ref 4.22–5.81)
RDW: 13.9 % (ref 11.5–15.5)
WBC: 16.2 10*3/uL — ABNORMAL HIGH (ref 4.0–10.5)

## 2013-01-29 LAB — BASIC METABOLIC PANEL
BUN: 27 mg/dL — ABNORMAL HIGH (ref 6–23)
CO2: 26 mEq/L (ref 19–32)
Calcium: 8 mg/dL — ABNORMAL LOW (ref 8.4–10.5)
Chloride: 97 mEq/L (ref 96–112)
Creatinine, Ser: 1.74 mg/dL — ABNORMAL HIGH (ref 0.50–1.35)
GFR calc Af Amer: 44 mL/min — ABNORMAL LOW (ref >90)
GFR calc non Af Amer: 38 mL/min — ABNORMAL LOW (ref >90)
Glucose, Bld: 132 mg/dL — ABNORMAL HIGH (ref 70–99)
Potassium: 4 mEq/L (ref 3.5–5.1)
Sodium: 131 mEq/L — ABNORMAL LOW (ref 135–145)

## 2013-01-29 LAB — GLUCOSE, CAPILLARY
Glucose-Capillary: 118 mg/dL — ABNORMAL HIGH (ref 70–99)
Glucose-Capillary: 124 mg/dL — ABNORMAL HIGH (ref 70–99)
Glucose-Capillary: 137 mg/dL — ABNORMAL HIGH (ref 70–99)
Glucose-Capillary: 142 mg/dL — ABNORMAL HIGH (ref 70–99)
Glucose-Capillary: 148 mg/dL — ABNORMAL HIGH (ref 70–99)

## 2013-01-29 MED ORDER — POTASSIUM CHLORIDE 10 MEQ/50ML IV SOLN
10.0000 meq | INTRAVENOUS | Status: AC
  Administered 2013-01-29 (×2): 10 meq via INTRAVENOUS

## 2013-01-29 MED ORDER — ASPIRIN EC 81 MG PO TBEC
81.0000 mg | DELAYED_RELEASE_TABLET | Freq: Every day | ORAL | Status: DC
  Administered 2013-01-29 – 2013-02-04 (×7): 81 mg via ORAL
  Filled 2013-01-29 (×7): qty 1

## 2013-01-29 MED ORDER — FUROSEMIDE 40 MG PO TABS
40.0000 mg | ORAL_TABLET | Freq: Two times a day (BID) | ORAL | Status: DC
  Administered 2013-01-30 (×2): 40 mg via ORAL
  Filled 2013-01-29 (×5): qty 1

## 2013-01-29 MED ORDER — AMIODARONE HCL 200 MG PO TABS: 200.0000 mg | ORAL_TABLET | Freq: Two times a day (BID) | ORAL | Status: DC

## 2013-01-29 MED ORDER — INSULIN ASPART 100 UNIT/ML ~~LOC~~ SOLN
0.0000 [IU] | Freq: Three times a day (TID) | SUBCUTANEOUS | Status: DC
  Administered 2013-01-29: 2 [IU] via SUBCUTANEOUS

## 2013-01-29 MED ORDER — SODIUM CHLORIDE 0.9 % IJ SOLN
3.0000 mL | Freq: Two times a day (BID) | INTRAMUSCULAR | Status: DC
  Administered 2013-01-29 – 2013-01-30 (×2): 3 mL via INTRAVENOUS
  Administered 2013-01-30: 22:00:00 via INTRAVENOUS
  Administered 2013-01-31 – 2013-02-02 (×5): 3 mL via INTRAVENOUS

## 2013-01-29 MED ORDER — SODIUM CHLORIDE 0.9 % IJ SOLN: 3.0000 mL | INTRAMUSCULAR | Status: DC | PRN

## 2013-01-29 MED ORDER — FUROSEMIDE 10 MG/ML IJ SOLN
40.0000 mg | Freq: Two times a day (BID) | INTRAMUSCULAR | Status: AC
  Administered 2013-01-29 (×2): 40 mg via INTRAVENOUS
  Filled 2013-01-29: qty 4

## 2013-01-29 MED ORDER — POTASSIUM CHLORIDE CRYS ER 20 MEQ PO TBCR
20.0000 meq | EXTENDED_RELEASE_TABLET | Freq: Two times a day (BID) | ORAL | Status: DC
  Administered 2013-01-30 (×2): 20 meq via ORAL
  Filled 2013-01-29 (×4): qty 1

## 2013-01-29 MED ORDER — TRAMADOL HCL 50 MG PO TABS: 50.0000 mg | ORAL_TABLET | ORAL | Status: DC | PRN

## 2013-01-29 MED ORDER — SODIUM CHLORIDE 0.9 % IV SOLN: 250.0000 mL | INTRAVENOUS | Status: DC | PRN

## 2013-01-29 MED ORDER — MOVING RIGHT ALONG BOOK
Freq: Once | Status: AC
  Administered 2013-01-29: 10:00:00
  Filled 2013-01-29: qty 1

## 2013-01-29 MED ORDER — EZETIMIBE 10 MG PO TABS
10.0000 mg | ORAL_TABLET | Freq: Every day | ORAL | Status: DC
  Administered 2013-01-29 – 2013-02-04 (×7): 10 mg via ORAL
  Filled 2013-01-29 (×7): qty 1

## 2013-01-29 NOTE — Progress Notes (Signed)
Pt transferred from 2300, walked with RN, pt tolerated well. VSS, pt resting. Will continue to monitor.

## 2013-01-29 NOTE — Progress Notes (Signed)
Pt currently wearing CPAP. Machine is on a pt is resting comfortable with no distress noted. Rt will continue to monitor.

## 2013-01-29 NOTE — Progress Notes (Addendum)
   CARDIOTHORACIC SURGERY PROGRESS NOTE   R2 Days Post-Op Procedure(s) (LRB): CORONARY ARTERY BYPASS GRAFTING (CABG) x  two; using left internal mammary artery and right leg greater saphenous vein harvested endoscopically ([name]) MAZE ([name]) INTRAOPERATIVE TRANSESOPHAGEAL ECHOCARDIOGRAM ([name])  Subjective: Looks good.  Ambulated around SICU twice already this morning.  Mild soreness in chest.  Objective: Vital signs: BP Readings from Last 1 Encounters:  01/29/13 132/62   Pulse Readings from Last 1 Encounters:  01/29/13 76   Resp Readings from Last 1 Encounters:  01/29/13 21   Temp Readings from Last 1 Encounters:  01/29/13 97.8 F (36.6 C) Oral    Hemodynamics:    Physical Exam:  Rhythm:   Sinus 60-70  Breath sounds: clear  Heart sounds:  RRR  Incisions:  Clean and dry  Abdomen:  Soft, non distended  Extremities:  Warm, well perfused   Intake/Output from previous day: 02/28 0701 - 03/01 0700 In: 1419.4 [P.O.:720; I.V.:549.4; IV Piggyback:150] Out: 1355 [Urine:1330; Chest Tube:25] Intake/Output this shift: Total I/O In: 410 [P.O.:350; I.V.:60] Out: -   Lab Results:  Recent Labs  01/28/13 1600 01/28/13 1646 01/29/13 0400  WBC 16.9*  --  16.2*  HGB 12.6* 12.2* 11.8*  HCT 35.7* 36.0* 33.8*  PLT 106*  --  96*   BMET:  Recent Labs  01/28/13 0405  01/28/13 1646 01/29/13 0400  NA 137  --  134* 131*  K 4.0  --  4.2 4.0  CL 105  --  101 97  CO2 25  --   --  26  GLUCOSE 125*  --  133* 132*  BUN 21  --  24* 27*  CREATININE 1.23  < > 1.60* 1.74*  CALCIUM 8.7  --   --  8.0*  < > = values in this interval not displayed.  CBG (last 3)   Recent Labs  01/28/13 2349 01/29/13 0315 01/29/13 0744  GLUCAP 148* 124* 142*   ABG    Component Value Date/Time   PHART 7.328* 01/27/2013 1958   HCO3 22.6 01/27/2013 1958   TCO2 25 01/28/2013 1646   ACIDBASEDEF 3.0* 01/27/2013 1958   O2SAT 92.0 01/27/2013 1958   CXR: *RADIOLOGY REPORT*  Clinical Data: Postop  cardiac surgery  PORTABLE CHEST - 1 VIEW  Comparison: 01/28/2013  Findings: There is a right IJ catheter with tip in the projection  of the SVC. The patient is status post median sternotomy. There  has been removal of the left chest tube. No pneumothorax  identified. Persistent left pleural effusion.  IMPRESSION:  1. No pneumothorax after left chest tube removal.  2. Small left pleural effusion  Original Report Authenticated By: Signa Kell, M.D.   Assessment/Plan: S/P Procedure(s) (LRB): CORONARY ARTERY BYPASS GRAFTING (CABG) x  two; using left internal mammary artery and right leg greater saphenous vein harvested endoscopically ([name]) MAZE ([name]) INTRAOPERATIVE TRANSESOPHAGEAL ECHOCARDIOGRAM ([name])  Doing well POD2 Maintaining NSR s/p maze Expected post op acute blood loss anemia, mild, stable Expected post op volume excess, mild, diuresing Post op acute renal insufficiency, mild Post op thrombocytopenia, mild Post op atelectasis, mild Type II diabetes mellitus, good glycemic control Obesity OSA   Mobilize  Diuresis  Watch renal function  Turn pacer to AAI backup  Continue amiodarone  Restart Xarelto prior to d/c home after wires out  Continue levemir and SSI  Restart oral agents for DMII once appetite improved and renal function stable  Transfer step down   [name],[name] 01/29/2013 9:41 AM

## 2013-01-30 ENCOUNTER — Inpatient Hospital Stay (HOSPITAL_COMMUNITY): Payer: Medicare Other

## 2013-01-30 LAB — GLUCOSE, CAPILLARY
Glucose-Capillary: 100 mg/dL — ABNORMAL HIGH (ref 70–99)
Glucose-Capillary: 106 mg/dL — ABNORMAL HIGH (ref 70–99)
Glucose-Capillary: 107 mg/dL — ABNORMAL HIGH (ref 70–99)
Glucose-Capillary: 116 mg/dL — ABNORMAL HIGH (ref 70–99)
Glucose-Capillary: 119 mg/dL — ABNORMAL HIGH (ref 70–99)
Glucose-Capillary: 121 mg/dL — ABNORMAL HIGH (ref 70–99)
Glucose-Capillary: 143 mg/dL — ABNORMAL HIGH (ref 70–99)
Glucose-Capillary: 144 mg/dL — ABNORMAL HIGH (ref 70–99)
Glucose-Capillary: 146 mg/dL — ABNORMAL HIGH (ref 70–99)
Glucose-Capillary: 148 mg/dL — ABNORMAL HIGH (ref 70–99)
Glucose-Capillary: 150 mg/dL — ABNORMAL HIGH (ref 70–99)
Glucose-Capillary: 160 mg/dL — ABNORMAL HIGH (ref 70–99)
Glucose-Capillary: 173 mg/dL — ABNORMAL HIGH (ref 70–99)
Glucose-Capillary: 78 mg/dL (ref 70–99)
Glucose-Capillary: 91 mg/dL (ref 70–99)
Glucose-Capillary: 97 mg/dL (ref 70–99)
Glucose-Capillary: 107 mg/dL — ABNORMAL HIGH (ref 70–99)
Glucose-Capillary: 110 mg/dL — ABNORMAL HIGH (ref 70–99)
Glucose-Capillary: 116 mg/dL — ABNORMAL HIGH (ref 70–99)
Glucose-Capillary: 102 mg/dL — ABNORMAL HIGH (ref 70–99)

## 2013-01-30 LAB — CBC
HCT: 34.7 % — ABNORMAL LOW (ref 39.0–52.0)
Hemoglobin: 12.3 g/dL — ABNORMAL LOW (ref 13.0–17.0)
MCH: 31.6 pg (ref 26.0–34.0)
MCHC: 35.4 g/dL (ref 30.0–36.0)
MCV: 89.2 fL (ref 78.0–100.0)
Platelets: 95 10*3/uL — ABNORMAL LOW (ref 150–400)
RBC: 3.89 MIL/uL — ABNORMAL LOW (ref 4.22–5.81)
RDW: 13.7 % (ref 11.5–15.5)
WBC: 11.9 10*3/uL — ABNORMAL HIGH (ref 4.0–10.5)

## 2013-01-30 LAB — BASIC METABOLIC PANEL
BUN: 27 mg/dL — ABNORMAL HIGH (ref 6–23)
CO2: 26 mEq/L (ref 19–32)
Calcium: 8.3 mg/dL — ABNORMAL LOW (ref 8.4–10.5)
Chloride: 91 mEq/L — ABNORMAL LOW (ref 96–112)
Creatinine, Ser: 1.13 mg/dL (ref 0.50–1.35)
GFR calc Af Amer: 75 mL/min — ABNORMAL LOW (ref >90)
GFR calc non Af Amer: 64 mL/min — ABNORMAL LOW (ref >90)
Glucose, Bld: 154 mg/dL — ABNORMAL HIGH (ref 70–99)
Potassium: 3.7 mEq/L (ref 3.5–5.1)
Sodium: 127 mEq/L — ABNORMAL LOW (ref 135–145)

## 2013-01-30 MED ORDER — INSULIN DETEMIR 100 UNIT/ML ~~LOC~~ SOLN
15.0000 [IU] | Freq: Two times a day (BID) | SUBCUTANEOUS | Status: DC
  Administered 2013-01-30 – 2013-01-31 (×3): 15 [IU] via SUBCUTANEOUS

## 2013-01-30 MED ORDER — LINAGLIPTIN 5 MG PO TABS
5.0000 mg | ORAL_TABLET | Freq: Every day | ORAL | Status: DC
  Administered 2013-01-30: 5 mg via ORAL
  Filled 2013-01-30 (×2): qty 1

## 2013-01-30 MED ORDER — GUAIFENESIN ER 600 MG PO TB12
600.0000 mg | ORAL_TABLET | Freq: Two times a day (BID) | ORAL | Status: DC | PRN
  Administered 2013-01-30: 600 mg via ORAL
  Filled 2013-01-30: qty 1

## 2013-01-30 MED ORDER — NYSTATIN-TRIAMCINOLONE 100000-0.1 UNIT/GM-% EX CREA
1.0000 "application " | TOPICAL_CREAM | CUTANEOUS | Status: DC | PRN
  Filled 2013-01-30 (×2): qty 15

## 2013-01-30 NOTE — Progress Notes (Addendum)
[address]            [place] [address]          [phone]     3 Days Post-Op Procedure(s) (LRB): CORONARY ARTERY BYPASS GRAFTING (CABG) x  two; using left internal mammary artery and right leg greater saphenous vein harvested endoscopically ([name]) MAZE ([name]) INTRAOPERATIVE TRANSESOPHAGEAL ECHOCARDIOGRAM ([name])  Subjective: Comfortable, no complaints except soreness with cough.   Objective: Vital signs in last 24 hours: Patient Vitals for the past 24 hrs:  BP Temp Temp src Pulse Resp SpO2 Weight  01/30/13 0525 95/57 mmHg 98.6 F (37 C) - 77 18 92 % 258 lb (117.028 kg)  01/30/13 0021 - 98.3 F (36.8 C) Axillary - - - -  01/29/13 1935 144/88 mmHg 100 F (37.8 C) Oral 88 20 96 % -  01/29/13 1715 102/49 mmHg - - 75 20 94 % -  01/29/13 1600 111/53 mmHg - - 72 20 93 % -  01/29/13 1546 - 98 F (36.7 C) Oral - - - -  01/29/13 1500 108/58 mmHg - - 72 20 94 % -  01/29/13 1400 107/62 mmHg - - 75 20 94 % -  01/29/13 1300 115/60 mmHg - - 76 24 94 % -  01/29/13 1200 114/55 mmHg - - 74 15 94 % -  01/29/13 1142 - 97.9 F (36.6 C) Oral - - - -  01/29/13 1100 111/57 mmHg - - 73 14 95 % -   Current Weight  01/30/13 258 lb (117.028 kg)  PRE-OPERATIVE WEIGHT: 110 kg    Intake/Output from previous day: 03/01 0701 - 03/02 0700 In: 1890 [P.O.:1600; I.V.:140; IV Piggyback:150] Out: 2476 [Urine:2475; Stool:1]  CBGs 137- 118-107    PHYSICAL EXAM:  Heart: RRR Lungs: Decreased BS in bases Wound: Clean and dry Extremities: Mild LE edema    Lab Results: CBC: Recent Labs  01/28/13 1600 01/28/13 1646 01/29/13 0400  WBC 16.9*  --  16.2*  HGB 12.6* 12.2* 11.8*  HCT 35.7* 36.0* 33.8*  PLT 106*  --  96*   BMET:  Recent Labs  01/28/13 0405  01/28/13 1646 01/29/13 0400  NA 137  --  134* 131*  K 4.0  --  4.2 4.0  CL 105  --  101 97  CO2 25  --   --  26  GLUCOSE 125*  --  133* 132*  BUN 21  --  24* 27*  CREATININE 1.23  < > 1.60*  1.74*  CALCIUM 8.7  --   --  8.0*  < > = values in this interval not displayed.  PT/INR:  Recent Labs  01/27/13 1430  LABPROT 15.6*  INR 1.27   CXR: Findings: Prior CABG. Cardiomegaly. Left lower lobe atelectasis with small left effusion. Minimal right base atelectasis. No pneumothorax. No acute bony abnormality.  IMPRESSION:  Continued bibasilar opacities and small left effusion. No real  change since prior study.    Assessment/Plan: S/P Procedure(s) (LRB): CORONARY ARTERY BYPASS GRAFTING (CABG) x  two; using left internal mammary artery and right leg greater saphenous vein harvested endoscopically ([name]) MAZE ([name]) INTRAOPERATIVE TRANSESOPHAGEAL ECHOCARDIOGRAM ([name])  CV- stable, SR. Continue Amio, Lopressor.  Will roll and tape EPWs. Resume Xarelto prior to d/c.  Vol overload- diurese.  Acute renal insufficiency- Monitor.  DM- sugars stable.  Will start to decrease Insulin and resume Januvia.  Metformin  on hold due to increased Cr.   CRPI, pulm toilet.  LOS: 3 days    COLLINS,GINA H 01/30/2013  I have seen and examined the patient and agree with the assessment and plan as outlined.  Maintaining NSR.  Looks good.  [name],[name] 01/30/2013 12:06 PM

## 2013-01-30 NOTE — Progress Notes (Signed)
Pt ambulated 650 ft in hallway with RN, tolerated well on 2L of 02. Back in chair resting, will continue to monitor.

## 2013-01-30 NOTE — Progress Notes (Signed)
Pt ambulated 650 ft in hallway with NT. Pt tolerated well, back in chair resting. Will continue to monitor.

## 2013-01-30 NOTE — Progress Notes (Signed)
Pt ambulated 650 ft in hallway with RN, tolerated well on 2L 02. Back in chair resting, will continue to monitor.

## 2013-01-31 LAB — GLUCOSE, CAPILLARY
Glucose-Capillary: 35 mg/dL — CL (ref 70–99)
Glucose-Capillary: 97 mg/dL (ref 70–99)
Glucose-Capillary: 94 mg/dL (ref 70–99)
Glucose-Capillary: 116 mg/dL — ABNORMAL HIGH (ref 70–99)
Glucose-Capillary: 96 mg/dL (ref 70–99)

## 2013-01-31 LAB — BASIC METABOLIC PANEL
BUN: 22 mg/dL (ref 6–23)
CO2: 25 mEq/L (ref 19–32)
Calcium: 8.2 mg/dL — ABNORMAL LOW (ref 8.4–10.5)
Chloride: 90 mEq/L — ABNORMAL LOW (ref 96–112)
Creatinine, Ser: 1 mg/dL (ref 0.50–1.35)
GFR calc Af Amer: 87 mL/min — ABNORMAL LOW (ref >90)
GFR calc non Af Amer: 75 mL/min — ABNORMAL LOW (ref >90)
Glucose, Bld: 104 mg/dL — ABNORMAL HIGH (ref 70–99)
Potassium: 3.7 mEq/L (ref 3.5–5.1)
Sodium: 126 mEq/L — ABNORMAL LOW (ref 135–145)

## 2013-01-31 MED ORDER — AMIODARONE HCL 200 MG PO TABS
200.0000 mg | ORAL_TABLET | Freq: Two times a day (BID) | ORAL | Status: DC
  Administered 2013-01-31 – 2013-02-01 (×2): 200 mg via ORAL
  Filled 2013-01-31 (×4): qty 1

## 2013-01-31 MED ORDER — RIVAROXABAN 20 MG PO TABS
20.0000 mg | ORAL_TABLET | Freq: Every day | ORAL | Status: DC
  Administered 2013-02-01 – 2013-02-04 (×4): 20 mg via ORAL
  Filled 2013-01-31 (×4): qty 1

## 2013-01-31 MED ORDER — POTASSIUM CHLORIDE CRYS ER 20 MEQ PO TBCR
40.0000 meq | EXTENDED_RELEASE_TABLET | Freq: Three times a day (TID) | ORAL | Status: AC
  Administered 2013-01-31 (×2): 40 meq via ORAL
  Filled 2013-01-31: qty 2

## 2013-01-31 MED ORDER — LINAGLIPTIN 5 MG PO TABS
5.0000 mg | ORAL_TABLET | Freq: Every day | ORAL | Status: DC
  Administered 2013-01-31 – 2013-02-04 (×5): 5 mg via ORAL
  Filled 2013-01-31 (×5): qty 1

## 2013-01-31 MED ORDER — FUROSEMIDE 40 MG PO TABS
40.0000 mg | ORAL_TABLET | Freq: Two times a day (BID) | ORAL | Status: DC
  Administered 2013-02-01 – 2013-02-02 (×3): 40 mg via ORAL
  Filled 2013-01-31 (×5): qty 1

## 2013-01-31 MED ORDER — POTASSIUM CHLORIDE CRYS ER 20 MEQ PO TBCR
20.0000 meq | EXTENDED_RELEASE_TABLET | Freq: Two times a day (BID) | ORAL | Status: DC
  Administered 2013-02-01 (×2): 20 meq via ORAL
  Filled 2013-01-31 (×4): qty 1

## 2013-01-31 MED ORDER — FUROSEMIDE 10 MG/ML IJ SOLN
40.0000 mg | Freq: Two times a day (BID) | INTRAMUSCULAR | Status: AC
  Administered 2013-01-31 (×2): 40 mg via INTRAVENOUS
  Filled 2013-01-31: qty 4

## 2013-01-31 MED FILL — Lidocaine HCl IV Inj 20 MG/ML: INTRAVENOUS | Qty: 5 | Status: AC

## 2013-01-31 MED FILL — Heparin Sodium (Porcine) Inj 1000 Unit/ML: INTRAMUSCULAR | Qty: 10 | Status: AC

## 2013-01-31 MED FILL — Sodium Chloride IV Soln 0.9%: INTRAVENOUS | Qty: 1000 | Status: AC

## 2013-01-31 MED FILL — Sodium Bicarbonate IV Soln 8.4%: INTRAVENOUS | Qty: 50 | Status: AC

## 2013-01-31 MED FILL — Electrolyte-R (PH 7.4) Solution: INTRAVENOUS | Qty: 4000 | Status: AC

## 2013-01-31 MED FILL — Heparin Sodium (Porcine) Inj 1000 Unit/ML: INTRAMUSCULAR | Qty: 30 | Status: AC

## 2013-01-31 MED FILL — Mannitol IV Soln 20%: INTRAVENOUS | Qty: 500 | Status: AC

## 2013-01-31 MED FILL — Sodium Chloride Irrigation Soln 0.9%: Qty: 3000 | Status: AC

## 2013-01-31 NOTE — Progress Notes (Signed)
   CARDIOTHORACIC SURGERY PROGRESS NOTE  4 Days Post-Op  S/P Procedure(s) (LRB): CORONARY ARTERY BYPASS GRAFTING (CABG) x  two; using left internal mammary artery and right leg greater saphenous vein harvested endoscopically ([name]) MAZE ([name]) INTRAOPERATIVE TRANSESOPHAGEAL ECHOCARDIOGRAM ([name])  Subjective: Doing well.  Episode SOB last night that resolved after he coughed up some thick sputum.  Feels well this morning.  Mild soreness. Appetite slowly improving.  Objective: Vital signs in last 24 hours: Temp:  [98 F (36.7 C)-98.4 F (36.9 C)] 98.4 F (36.9 C) (03/03 0428) Pulse Rate:  [74-79] 74 (03/03 0428) Cardiac Rhythm:  [-] Normal sinus rhythm (03/02 2055) Resp:  [18-24] 18 (03/03 0428) BP: (110-117)/(54-70) 117/68 mmHg (03/03 0428) SpO2:  [94 %-97 %] 97 % (03/03 0428) Weight:  [113.535 kg (250 lb 4.8 oz)] 113.535 kg (250 lb 4.8 oz) (03/03 0249)  Physical Exam:  Rhythm:   sinus  Breath sounds: Few crackles  Heart sounds:  RRR  Incisions:  Clean and dry  Abdomen:  soft  Extremities:  warm   Intake/Output from previous day: 03/02 0701 - 03/03 0700 In: 1080 [P.O.:1080] Out: 2050 [Urine:2050] Intake/Output this shift:    Lab Results:  Recent Labs  01/29/13 0400 01/30/13 0942  WBC 16.2* 11.9*  HGB 11.8* 12.3*  HCT 33.8* 34.7*  PLT 96* 95*   BMET:  Recent Labs  01/30/13 0942 01/31/13 0550  NA 127* 126*  K 3.7 3.7  CL 91* 90*  CO2 26 25  GLUCOSE 154* 104*  BUN 27* 22  CREATININE 1.13 1.00  CALCIUM 8.3* 8.2*    CBG (last 3)   Recent Labs  01/30/13 1625 01/30/13 2123 01/31/13 0619  GLUCAP 116* 102* 97   PT/INR:  No results found for this basename: LABPROT, INR,  in the last 72 hours  CXR:  [name]  Assessment/Plan: S/P Procedure(s) (LRB): CORONARY ARTERY BYPASS GRAFTING (CABG) x  two; using left internal mammary artery and right leg greater saphenous vein harvested endoscopically ([name]) MAZE ([name]) INTRAOPERATIVE TRANSESOPHAGEAL ECHOCARDIOGRAM  ([name])  Overall progressing fairly well POD4 Maintaining NSR Expected post op acute blood loss anemia, mild, stable Expected post op volume excess Type II diabetes mellitus, good glycemic control Hyponatremia, likely due to free water excess Hypokalemia, diuretic induced Postop atelectasis Obesity OSA   Mobilize  Diuresis  Supplement K+  D/C pacing wires  Resume Xarelto for h/o Afib  Wean O2   [name],[name] 01/31/2013 8:13 AM

## 2013-01-31 NOTE — Progress Notes (Signed)
EPW x 2 DC'd mer MD order and protocol.Pt tolerated well, ends intact, no ectopy or bleeding noted.  Instructed to remain in bed x 1 hr.  Wife at bedside, call bell in reach, will continue to monitor closely. Ave Filter

## 2013-01-31 NOTE — Progress Notes (Signed)
CARDIAC REHAB PHASE I   PRE:  Rate/Rhythm:   BP:  Supine:   Sitting:   Standing:    SaO2: 97% 2L  Pt up in hall independently  MODE:  Ambulation: 550 ft   POST:  Rate/Rhythem: 91SR  BP:  Supine:   Sitting: 129/67  Standing:    SaO2: 93%2L 0928-0950 Pt up in hall independently on 2L oxygen. Pt states he is using it because he is having to cough up phlegm and working that up makes him SOB. Walked 550 ft with steady gait. Did not use device. Tolerated well. Pt has attended CRP 2 before. Permission given to refer to [place] program. Pt states his wife smokes a floor below him but he can still smell it. Told pt we like for family to smoke outside when pt has had surgery. Pt stated he would like information for his wife on quitting. Left 2 handouts for her and encouraged pt to have her call [id number] for coaching. To recliner with call bell. Encouraged IS.  Duanne Limerick

## 2013-02-01 LAB — BASIC METABOLIC PANEL
BUN: 21 mg/dL (ref 6–23)
CO2: 26 mEq/L (ref 19–32)
Calcium: 8.4 mg/dL (ref 8.4–10.5)
Chloride: 90 mEq/L — ABNORMAL LOW (ref 96–112)
Creatinine, Ser: 1.07 mg/dL (ref 0.50–1.35)
GFR calc Af Amer: 80 mL/min — ABNORMAL LOW (ref >90)
GFR calc non Af Amer: 69 mL/min — ABNORMAL LOW (ref >90)
Glucose, Bld: 108 mg/dL — ABNORMAL HIGH (ref 70–99)
Potassium: 3.7 mEq/L (ref 3.5–5.1)
Sodium: 127 mEq/L — ABNORMAL LOW (ref 135–145)

## 2013-02-01 LAB — GLUCOSE, CAPILLARY
Glucose-Capillary: 111 mg/dL — ABNORMAL HIGH (ref 70–99)
Glucose-Capillary: 81 mg/dL (ref 70–99)
Glucose-Capillary: 97 mg/dL (ref 70–99)
Glucose-Capillary: 103 mg/dL — ABNORMAL HIGH (ref 70–99)

## 2013-02-01 MED ORDER — OXYCODONE HCL 5 MG PO TABS: 5.0000 mg | ORAL_TABLET | ORAL | Status: DC | PRN

## 2013-02-01 MED ORDER — POTASSIUM CHLORIDE CRYS ER 20 MEQ PO TBCR
40.0000 meq | EXTENDED_RELEASE_TABLET | Freq: Once | ORAL | Status: AC
  Administered 2013-02-01: 20 meq via ORAL
  Filled 2013-02-01: qty 2

## 2013-02-01 MED ORDER — METFORMIN HCL 500 MG PO TABS
500.0000 mg | ORAL_TABLET | Freq: Two times a day (BID) | ORAL | Status: DC
  Administered 2013-02-02 – 2013-02-04 (×5): 500 mg via ORAL
  Filled 2013-02-01 (×8): qty 1

## 2013-02-01 MED ORDER — ASPIRIN 81 MG PO TBEC: 81.0000 mg | DELAYED_RELEASE_TABLET | Freq: Every day | ORAL | Status: DC

## 2013-02-01 MED ORDER — POTASSIUM CHLORIDE CRYS ER 20 MEQ PO TBCR: 20.0000 meq | EXTENDED_RELEASE_TABLET | Freq: Two times a day (BID) | ORAL | Status: DC

## 2013-02-01 MED ORDER — AMIODARONE HCL 200 MG PO TABS: 200.0000 mg | ORAL_TABLET | Freq: Two times a day (BID) | ORAL | Status: DC

## 2013-02-01 MED ORDER — AMIODARONE HCL 200 MG PO TABS
400.0000 mg | ORAL_TABLET | Freq: Two times a day (BID) | ORAL | Status: DC
  Administered 2013-02-01 – 2013-02-02 (×2): 400 mg via ORAL
  Filled 2013-02-01 (×4): qty 2

## 2013-02-01 MED ORDER — FUROSEMIDE 40 MG PO TABS: 40.0000 mg | ORAL_TABLET | Freq: Every day | ORAL | Status: DC

## 2013-02-01 MED ORDER — METOPROLOL TARTRATE 12.5 MG HALF TABLET
12.5000 mg | ORAL_TABLET | Freq: Two times a day (BID) | ORAL | Status: DC
  Administered 2013-02-01 – 2013-02-02 (×3): 12.5 mg via ORAL
  Filled 2013-02-01 (×4): qty 1

## 2013-02-01 MED ORDER — DSS 100 MG PO CAPS: 200.0000 mg | ORAL_CAPSULE | Freq: Every day | ORAL | Status: DC

## 2013-02-01 NOTE — Progress Notes (Signed)
CARDIAC REHAB PHASE I   PRE:  Rate/Rhythm: 97afib  BP:  Supine:   Sitting: 118/70  Standing:    SaO2: 93%RA  MODE:  Ambulation: 700 ft   POST:  Rate/Rhythem: 99-113 afib  BP:  Supine:   Sitting: 118/74  Standing:    SaO2: 96%RA 1035-1105 Pt walked 700 ft on RA with steady gait. Tolerated well. To recliner after walk.  [name]

## 2013-02-01 NOTE — Care Management Note (Unsigned)
    Page 1 of 2   02/03/2013     4:07:27 PM   CARE MANAGEMENT NOTE 02/03/2013  Patient:  [name], [name]   Account Number:  [id number]  Date Initiated:  01/27/2013  Documentation initiated by:  [facility]  Subjective/Objective Assessment:   Post op CABG x2.  Has a spouse.     Action/Plan:   WIFE TO PROVIDE 24HR CARE AT DC.  AMBULATING INDEPENDENTLY WITHOUT ASSISTIVE DEVICE.  REQUESTS INFO ON [employer].  WILL RESEARCH FOR PT.   Anticipated DC Date:  02/02/2013   Anticipated DC Plan:  HOME W HOME HEALTH SERVICES      DC Planning Services  CM consult      Choice offered to / List presented to:     DME arranged  OTHER - SEE COMMENT      DME agency  [facility]        Status of service:  In process, will continue to follow Medicare Important Message given?   (If response is "NO", the following Medicare IM given date fields will be blank) Date Medicare IM given:   Date Additional Medicare IM given:    Discharge Disposition:    Per UR Regulation:  Reviewed for med. necessity/level of care/duration of stay  If discussed at Long Length of Stay Meetings, dates discussed:    Comments:  Contact:  Bero,Elizabeth Spouse [phone]  02/02/13 Kyani Simkin,RN,BSN [phone] PT/WIFE REQUEST GERI CHAIR FOR HOME.  THEY STATE THEY DO NOT HAVE A RECLINER, AND THINK PT WOULD BE MORE COMFORTABLE IN THIS WHILE RECOVERING.  WILL REFER TO [facility] FOR GERI CHAIR RENTAL.

## 2013-02-01 NOTE — Discharge Summary (Addendum)
Physician Discharge Summary  Patient ID: [name] MRN: [id number] DOB/AGE: [birth year] 72 y.o.  Admit date: 01/27/2013 Discharge date: 02/02/2013  Admission Diagnoses:  Patient Active Problem List  Diagnosis  . Atrial fibrillation  . Coronary atherosclerosis of native coronary artery  . Cardiomyopathy, ischemic  . OSA (obstructive sleep apnea)  . Type II diabetes mellitus  . Hyperlipidemia  . Obesity (BMI 30-39.9)  . Hypertension   Discharge Diagnoses:   Patient Active Problem List  Diagnosis  . Atrial fibrillation  . Coronary atherosclerosis of native coronary artery  . Cardiomyopathy, ischemic  . OSA (obstructive sleep apnea)  . Type II diabetes mellitus  . Hyperlipidemia  . Obesity (BMI 30-39.9)  . Hypertension  . S/P CABG x 2  . S/P Maze operation for atrial fibrillation   Discharged Condition: good  History of Present Illness:   [name] is a 72 yo morbidly obese white male with known history of Ischemic Cardiomyopathy, Persistent Atrial Fibrillation,  Type 2 Diabetes Mellitis, Hyperlipidemia, and OSA.  The patients cardiac history started in 2008 at which time the patient was diagnosed with an acute inferior wall myocardial infarction.  He was treated with PCI with subsequent stenting of the RCA.  The patient has been followed by Dr. Excell Seltzer since that time.  The patient was doing well until this past November.  At that time he presented to Dr. Excell Seltzer with a complaint of palpitations with an elevated pulse rate.  At that time he was found to be in Atrial Fibrillation.  His beta blocker was increased and he was started on Xarelto.  He underwent Echocardiogram was performed which showed a reduced EF of 35-40% but no valvular abnormalities were identified.  There was some hypokinesis involving the left anterolateral and inferoposterior wall.  He underwent cardioversion on 11/30/2012, however patient converted back into Atrial fibrillation shortly after the  procedure.  Due to his known history of CAD it was felt the patient would benefit from repeat cardiac catheterization which was done on 01/17/2013 and showed 2 vessel CAD.  It was felt at that time the patient would benefit from possible Coronary Bypass procedure and possible MAZE procedure.  He was subsequently referred to [facility] for surgical evaluation.  He was evaluated by Dr. Cornelius Moras on 01/19/2013 at which time it was felt the patient would benefit from surgical intervention.  The risks and benefits of the procedure were explained to the patient and he was agreeable to proceed.  Hospital Course:   The patient presented to [facility] on [facility]/27/2013.  He was taken to the operating room and underwent CABG x 2 utilizing LIMA to LAD, SVG to Distal RCA.  He also underwent MAZE procedure with complete bi atrial lesion set with clipping of Left Atrial Appendage.  Finally he underwent endoscopic vein harvest from right thigh.  He tolerated the procedure well and was taken to the SICU in stable condition.  The patient was extubated the evening of surgery.  During his stay in the ICU the patient was weaned off all cardiac drips.  His chest tubes and arterial lines were removed without difficulty.  He had some issues with junctional bradycardia resulting in discontinuation of his beta blocker.  Patient was medically stable and transferred to the step down unit in stable condition.  The patient's heart rate improved and he had been maintaining NSR.  His pacing wires were removed without difficulty.  He was restarted on his home Xarelto.  The patient developed rate controlled  Atrial Fibrillation and he was subsequently placed on his home dose of Beta Blocker and will remain on Amiodarone.  Should no further issues arise we anticipate discharge home in the next 24-48 hours.   Significant Diagnostic Studies: angiography:   Hemodynamics:  AO 105/72 with a mean of 87  LV 106/17  Coronary angiography:  Coronary  dominance: right  Left mainstem: The left main is patent. There is minimal irregularity with no significant stenosis. The left main divides into the LAD and left circumflex.  Left anterior descending (LAD): The LAD has diffuse proximal vessel disease. There is a hypodense eccentric plaque in the proximal LAD with associated 70% stenosis. The mid LAD has tandem lesions of 75-80%. There are 3 small diagonal branches that arise from the diseased area of the proximal and mid LAD. Further down in the mid and distal LAD the vessel is small in caliber but has no significant stenosis. The diagonal branches are all patent with mild to moderate ostial stenosis. The LAD reaches the left ventricular apex.  Left circumflex (LCx): The left circumflex is patent throughout its course. There is some calcification and mild disease involving the ostium of the circumflex with approximately 30% stenosis. There is a moderate caliber obtuse marginal branch without significant stenosis. The AV groove circumflex beyond the first obtuse marginal is a small vessel.  Right coronary artery (RCA): The RCA is a large, dominant vessel. The stented segment in the proximal vessel is patent with mild diffuse in-stent restenosis. The mid vessel has tandem lesions estimated at 50%. The distal RCA is patent with diffuse irregularity. The PDA is patent. The posterolateral branch is large without significant stenosis.  Left ventriculography: There is severe left ventricular systolic dysfunction. The basal and midinferior wall are akinetic. There is severe diffuse hypokinesis, especially of the anterolateral wall. The apex is also hypokinetic. The estimated left ventricular ejection fraction is 30%.  Treatments: surgery:   Coronary Artery Bypass Grafting x 2  Left Internal Mammary Artery to Distal Left Anterior Descending Coronary Artery  Saphenous Vein Graft to Distal Right Coronary Artery  Endoscopic Vein Harvest from Right Thigh  Maze  Procedure  complete biatrial lesion set with clipping of left atrial appendage  Disposition: 01-Home or Self Care  The patient has been discharged on:   1.Beta Blocker:  Yes [ x  ]                              No   [   ]                              If No, reason:  2.Ace Inhibitor/ARB: Yes [   ]                                     No  [  x  ]                                     If No, reason: elevated creatinine, labile blood pressure will evaluate as outpatient  3.Statin:   Yes [ x  ]                  No  [   ]  If No, reason:  4.Ecasa:  Yes  [ x  ]                  No   [   ]                  If No, reason:         Discharge Orders   Future Appointments Provider Department Dept Phone   02/04/2013 9:45 AM Barbaraann Share, MD [facility] [phone]   02/21/2013 2:00 PM Purcell Nails, MD [facility] [phone]   Future Orders Complete By Expires     Amb Referral to Cardiac Rehabilitation  As directed         Medication List     Medication List    TAKE these medications       amiodarone 400 MG tablet  Commonly known as:  PACERONE  Take 1 tablet (400 mg total) by mouth 2 (two) times daily. For 7 days; then take Amiodarone 200 mg by mouth two times daily thereafter.     aspirin 81 MG EC tablet  Take 1 tablet (81 mg total) by mouth daily.     cetirizine 10 MG tablet  Commonly known as:  ZYRTEC  Take 10 mg by mouth daily.     DSS 100 MG Caps  Take 200 mg by mouth daily.     ezetimibe 10 MG tablet  Commonly known as:  ZETIA  Take 10 mg by mouth daily.     furosemide 40 MG tablet  Commonly known as:  LASIX  Take 1 tablet (40 mg total) by mouth daily. For 7 Days     metFORMIN 500 MG tablet  Commonly known as:  GLUCOPHAGE  Take 250 mg by mouth daily with breakfast.     metoprolol succinate 25 MG 24 hr tablet  Commonly known as:  TOPROL-XL  Take 25 mg by mouth daily.     oxyCODONE 5  MG immediate release tablet  Commonly known as:  Oxy IR/ROXICODONE  Take 1-2 tablets (5-10 mg total) by mouth every 4 (four) hours as needed for pain.     potassium chloride SA 20 MEQ tablet  Commonly known as:  K-DUR,KLOR-CON  Take 1 tablet (20 mEq total) by mouth daily. For 7 Days     Rivaroxaban 20 MG Tabs  Commonly known as:  XARELTO  Take 1 tablet (20 mg total) by mouth daily.     sitaGLIPtin 100 MG tablet  Commonly known as:  JANUVIA  Take 100 mg by mouth daily.       Follow-up Information   Follow up with Purcell Nails, MD. (PA/LAT CXR to be taken (at [facility] which is in the same building as Dr. Orvan July office) on 02/21/2013 at 1:00 pm;Appointment with Dr. Cornelius Moras is on 02/21/2013 at 2:00 pm)    Contact information:   [address] [place] [address] [phone]       Schedule an appointment as soon as possible for a visit with [name], MD. (Please contact office to set up 2-4 week follow up appointment)    Contact information:   [address] [place] [address] [phone]        Signed: Ardelle Balls PA-C 02/02/2013, 8:34 AM

## 2013-02-01 NOTE — Progress Notes (Addendum)
5 Days Post-Op Procedure(s) (LRB): CORONARY ARTERY BYPASS GRAFTING (CABG) x  two; using left internal mammary artery and right leg greater saphenous vein harvested endoscopically ([name]) MAZE ([name]) INTRAOPERATIVE TRANSESOPHAGEAL ECHOCARDIOGRAM ([name]) Subjective:  [name] states he feels short of breath this morning.  He is off oxygen, but uses CPAP at night. + BM  Objective: Vital signs in last 24 hours: Temp:  [97.2 F (36.2 C)-98.8 F (37.1 C)] 97.9 F (36.6 C) (03/04 0530) Pulse Rate:  [76-84] 76 (03/04 0530) Cardiac Rhythm:  [-] Normal sinus rhythm (03/03 2100) Resp:  [16-18] 18 (03/04 0530) BP: (104-120)/(59-65) 120/65 mmHg (03/04 0530) SpO2:  [92 %-99 %] 93 % (03/04 0530) Weight:  [250 lb (113.4 kg)] 250 lb (113.4 kg) (03/04 0530)  Intake/Output from previous day: 03/03 0701 - 03/04 0700 In: 1200 [P.O.:1200] Out: 400 [Urine:400]  General appearance: alert, cooperative and no distress Heart: irregularly irregular rhythm Lungs: diminished breath sounds left base Abdomen: soft, non-tender; bowel sounds normal; no masses,  no organomegaly Extremities: edema 1+ Wound: clean and dry  Lab Results:  Recent Labs  01/30/13 0942  WBC 11.9*  HGB 12.3*  HCT 34.7*  PLT 95*   BMET:  Recent Labs  01/30/13 0942 01/31/13 0550  NA 127* 126*  K 3.7 3.7  CL 91* 90*  CO2 26 25  GLUCOSE 154* 104*  BUN 27* 22  CREATININE 1.13 1.00  CALCIUM 8.3* 8.2*    PT/INR: No results found for this basename: LABPROT, INR,  in the last 72 hours ABG    Component Value Date/Time   PHART 7.328* 01/27/2013 1958   HCO3 22.6 01/27/2013 1958   TCO2 25 01/28/2013 1646   ACIDBASEDEF 3.0* 01/27/2013 1958   O2SAT 92.0 01/27/2013 1958   CBG (last 3)   Recent Labs  01/31/13 1655 01/31/13 2040 02/01/13 0619  GLUCAP 116* 96 111*    Assessment/Plan: S/P Procedure(s) (LRB): CORONARY ARTERY BYPASS GRAFTING (CABG) x  two; using left internal mammary artery and right leg greater saphenous  vein harvested endoscopically ([name]) MAZE ([name]) INTRAOPERATIVE TRANSESOPHAGEAL ECHOCARDIOGRAM ([name])  1. CV- Atrial Fibrillation this morning- rate in the 100s, will start Lopressor 12.5 mg BID (patient on 25mg  XL preop), continue Amiodarone and Xarelto 2. Pulm- + dyspnea, small pleural effusion on left, continue IS 3. Renal- creatinine is back to WNL, remains volume overloaded, continue diuresis 4. DM- CBGs controlled, will d/c insulin and restart home Metformin 5. Dispo- A. Fib this morning, will start Lopressor, continue diuresis, possible d/c home in AM   LOS: 5 days    [name], [name] 02/01/2013  I have seen and examined the patient and agree with the assessment and plan as outlined.  Now in Afib.  Restart beta blocker and increase amiodarone to 400 mg bid.  Supplement K+  [name],[name] 02/01/2013 12:41 PM

## 2013-02-01 NOTE — Progress Notes (Signed)
Pt is placing self on and off cpap.

## 2013-02-02 LAB — GLUCOSE, CAPILLARY
Glucose-Capillary: 117 mg/dL — ABNORMAL HIGH (ref 70–99)
Glucose-Capillary: 77 mg/dL (ref 70–99)
Glucose-Capillary: 81 mg/dL (ref 70–99)

## 2013-02-02 MED ORDER — AMIODARONE HCL 200 MG PO TABS
400.0000 mg | ORAL_TABLET | Freq: Three times a day (TID) | ORAL | Status: AC
  Administered 2013-02-02 – 2013-02-03 (×5): 400 mg via ORAL
  Filled 2013-02-02 (×6): qty 2

## 2013-02-02 MED ORDER — OXYCODONE HCL 5 MG PO TABS: 5.0000 mg | ORAL_TABLET | ORAL | Status: DC | PRN

## 2013-02-02 MED ORDER — FUROSEMIDE 40 MG PO TABS
40.0000 mg | ORAL_TABLET | Freq: Every day | ORAL | Status: DC
  Administered 2013-02-02 – 2013-02-04 (×3): 40 mg via ORAL
  Filled 2013-02-02 (×3): qty 1

## 2013-02-02 MED ORDER — POTASSIUM CHLORIDE CRYS ER 20 MEQ PO TBCR: 20.0000 meq | EXTENDED_RELEASE_TABLET | Freq: Every day | ORAL | Status: DC

## 2013-02-02 MED ORDER — AMIODARONE HCL 400 MG PO TABS: 400.0000 mg | ORAL_TABLET | Freq: Two times a day (BID) | ORAL | Status: DC

## 2013-02-02 MED ORDER — METOPROLOL TARTRATE 25 MG PO TABS
25.0000 mg | ORAL_TABLET | Freq: Two times a day (BID) | ORAL | Status: DC
  Administered 2013-02-02 – 2013-02-04 (×4): 25 mg via ORAL
  Filled 2013-02-02 (×5): qty 1

## 2013-02-02 MED ORDER — POTASSIUM CHLORIDE CRYS ER 20 MEQ PO TBCR
20.0000 meq | EXTENDED_RELEASE_TABLET | Freq: Every day | ORAL | Status: DC
  Administered 2013-02-02 – 2013-02-04 (×3): 20 meq via ORAL
  Filled 2013-02-02 (×2): qty 1

## 2013-02-02 NOTE — Progress Notes (Signed)
Pt ambulated 1000+ ft independently, steady gait.  Will continue to monitor.

## 2013-02-02 NOTE — Progress Notes (Signed)
110 Has walked twice already today. Observed pt walking and he is very steady. We will continue to follow and educate when wife here. Charlene DunlapRN

## 2013-02-02 NOTE — Progress Notes (Signed)
Pt ambulated 1200 ft with no assistance and on rm air pt tolerated activity well. Will continue to monitor.   DAILEY, CHERISE M

## 2013-02-02 NOTE — Progress Notes (Addendum)
                   [address]            [employer] [address]          [phone]      6 Days Post-Op Procedure(s) (LRB): CORONARY ARTERY BYPASS GRAFTING (CABG) x  two; using left internal mammary artery and right leg greater saphenous vein harvested endoscopically ([name]) MAZE ([name]) INTRAOPERATIVE TRANSESOPHAGEAL ECHOCARDIOGRAM ([name])  Subjective: Patient feels irregular heartbeat on occasion  Objective: Vital signs in last 24 hours: Temp:  [97.2 F (36.2 C)-98.4 F (36.9 C)] 98.4 F (36.9 C) (03/05 0547) Pulse Rate:  [74-90] 74 (03/05 0547) Cardiac Rhythm:  [-] Normal sinus rhythm;Atrial fibrillation (03/04 2030) Resp:  [18] 18 (03/05 0547) BP: (93-124)/(56-67) 93/56 mmHg (03/05 0547) SpO2:  [92 %-97 %] 92 % (03/05 0547) Weight:  [111.8 kg (246 lb 7.6 oz)] 111.8 kg (246 lb 7.6 oz) (03/05 0547)  Pre op weight  110.2 kg Current Weight  02/02/13 111.8 kg (246 lb 7.6 oz)      Intake/Output from previous day: 03/04 0701 - 03/05 0700 In: 480 [P.O.:480] Out: -    Physical Exam:  Cardiovascular: [facility] Pulmonary: Slightly diminished at bases L>R; no rales, wheezes, or rhonchi. Abdomen: Soft, non tender, bowel sounds present. Extremities: Mild bilateral lower extremity edema. Wounds: Clean and dry.  No erythema or signs of infection.  Lab Results: CBC: Recent Labs  01/30/13 0942  WBC 11.9*  HGB 12.3*  HCT 34.7*  PLT 95*   BMET:  Recent Labs  01/31/13 0550 02/01/13 0635  NA 126* 127*  K 3.7 3.7  CL 90* 90*  CO2 25 26  GLUCOSE 104* 108*  BUN 22 21  CREATININE 1.00 1.07  CALCIUM 8.2* 8.4    PT/INR:  Lab Results  Component Value Date   INR 1.27 01/27/2013   INR 0.98 01/25/2013   INR 1.8* 01/04/2013   ABG:  INR: Will add last result for INR, ABG once components are confirmed Will add last 4 CBG results once components are confirmed  Assessment/Plan:  1. CV - Remains in Afib with rates in low 100's this am. On Amiodarone 400 bid,  Lopressor 12.5 bid, and Xarelto 20 daily.  2.  Pulmonary - Encourage incentive spirometer 3. Volume Overload - On Lasix 40 bid 4.  Acute blood loss anemia - Last H and H 5.DM-CBGs  97/103/117. On Metformin 500 bid, Tradjenta 5 daily. Pre op HGA1C 6.9. 6.Ready for discharge soon  ZIMMERMAN,DONIELLE MPA-C 02/02/2013,8:12 AM  I have seen and examined the patient and agree with the assessment and plan as outlined.  Will increase metoprolol dose and continue to monitor rhythm.  Possibly ready for d/c home 1-2 days.  [name],[name] 02/02/2013 10:17 AM

## 2013-02-03 ENCOUNTER — Inpatient Hospital Stay (HOSPITAL_COMMUNITY): Payer: Medicare Other

## 2013-02-03 ENCOUNTER — Telehealth: Payer: Self-pay | Admitting: Cardiovascular Disease

## 2013-02-03 LAB — BASIC METABOLIC PANEL
BUN: 21 mg/dL (ref 6–23)
CO2: 25 mEq/L (ref 19–32)
Calcium: 9 mg/dL (ref 8.4–10.5)
Chloride: 91 mEq/L — ABNORMAL LOW (ref 96–112)
Creatinine, Ser: 1.12 mg/dL (ref 0.50–1.35)
GFR calc Af Amer: 76 mL/min — ABNORMAL LOW (ref >90)
GFR calc non Af Amer: 65 mL/min — ABNORMAL LOW (ref >90)
Glucose, Bld: 101 mg/dL — ABNORMAL HIGH (ref 70–99)
Potassium: 4.3 mEq/L (ref 3.5–5.1)
Sodium: 127 mEq/L — ABNORMAL LOW (ref 135–145)

## 2013-02-03 LAB — GLUCOSE, CAPILLARY
Glucose-Capillary: 88 mg/dL (ref 70–99)
Glucose-Capillary: 69 mg/dL — ABNORMAL LOW (ref 70–99)
Glucose-Capillary: 82 mg/dL (ref 70–99)
Glucose-Capillary: 114 mg/dL — ABNORMAL HIGH (ref 70–99)
Glucose-Capillary: 120 mg/dL — ABNORMAL HIGH (ref 70–99)

## 2013-02-03 LAB — MAGNESIUM: Magnesium: 2.1 mg/dL (ref 1.5–2.5)

## 2013-02-03 LAB — PRO B NATRIURETIC PEPTIDE: Pro B Natriuretic peptide (BNP): 945.7 pg/mL — ABNORMAL HIGH (ref 0–125)

## 2013-02-03 MED ORDER — AMIODARONE HCL 200 MG PO TABS
200.0000 mg | ORAL_TABLET | Freq: Two times a day (BID) | ORAL | Status: DC
  Administered 2013-02-04: 200 mg via ORAL
  Filled 2013-02-03 (×3): qty 1

## 2013-02-03 NOTE — Telephone Encounter (Signed)
Left message to call back  

## 2013-02-03 NOTE — Progress Notes (Signed)
Pt has ambulated independently in the hallway today, 3 times, pt tolerated well will continue to monitor patient. Cloer, Randall An rN

## 2013-02-03 NOTE — Progress Notes (Addendum)
7 Days Post-Op Procedure(s) (LRB): CORONARY ARTERY BYPASS GRAFTING (CABG) x  two; using left internal mammary artery and right leg greater saphenous vein harvested endoscopically ([name]) MAZE ([name]) INTRAOPERATIVE TRANSESOPHAGEAL ECHOCARDIOGRAM ([name]) Subjective:  [name] has no complaints this morning.  Is ambulating independently +BM  Objective: Vital signs in last 24 hours: Temp:  [97.3 F (36.3 C)-98.5 F (36.9 C)] 98.5 F (36.9 C) (03/06 0315) Pulse Rate:  [61-87] 61 (03/06 0315) Cardiac Rhythm:  [-] Normal sinus rhythm (03/05 2007) Resp:  [18] 18 (03/06 0315) BP: (105-126)/(52-61) 111/59 mmHg (03/06 0315) SpO2:  [95 %-96 %] 96 % (03/06 0315) Weight:  [245 lb 13 oz (111.5 kg)] 245 lb 13 oz (111.5 kg) (03/06 0315)  Intake/Output from previous day: 03/05 0701 - 03/06 0700 In: 1443 [P.O.:1440; I.V.:3] Out: -   General appearance: alert, cooperative and no distress Heart: regular rate and rhythm Lungs: diminished breath sounds Left Base Abdomen: soft, non-tender; bowel sounds normal; no masses,  no organomegaly Extremities: edema trace Wound: clean and dry  Lab Results: No results found for this basename: WBC, HGB, HCT, PLT,  in the last 72 hours BMET:  Recent Labs  02/01/13 0635 02/03/13 0520  NA 127* 127*  K 3.7 4.3  CL 90* 91*  CO2 26 25  GLUCOSE 108* 101*  BUN 21 21  CREATININE 1.07 1.12  CALCIUM 8.4 9.0    PT/INR: No results found for this basename: LABPROT, INR,  in the last 72 hours ABG    Component Value Date/Time   PHART 7.328* 01/27/2013 1958   HCO3 22.6 01/27/2013 1958   TCO2 25 01/28/2013 1646   ACIDBASEDEF 3.0* 01/27/2013 1958   O2SAT 92.0 01/27/2013 1958   CBG (last 3)   Recent Labs  02/02/13 1144 02/02/13 2145 02/03/13 0626  GLUCAP 77 81 88    Assessment/Plan: S/P Procedure(s) (LRB): CORONARY ARTERY BYPASS GRAFTING (CABG) x  two; using left internal mammary artery and right leg greater saphenous vein harvested endoscopically  ([name]) MAZE ([name]) INTRAOPERATIVE TRANSESOPHAGEAL ECHOCARDIOGRAM ([name])  1. CV- previous Atrial Fibrillation, NSR currently continue Lopressor, Amiodarone, Xarelto 2. Pulm- continue IS 3. Volume overload- remains on Lasix 4. DM- CBGs well controlled, will continue current regimen 5. Dispo- patient is medically stable at this time, however does not feel safe to go home until tomorrow when he will have better assistance.  Will plan for d/c in AM   LOS: 7 days    BARRETT, ERIN 02/03/2013  I have seen and examined the patient and agree with the assessment and plan as outlined.  Decrease amiodarone to 200 mg bid for discharge.  [name],[name] 02/03/2013 8:45 AM

## 2013-02-03 NOTE — Telephone Encounter (Signed)
New Prob    Pt had open heart surgery on 2/27. Wife and son are very concerned and have some questions they would like to ask the nurse.

## 2013-02-03 NOTE — Progress Notes (Addendum)
Hypoglycemic Event  CBG:69  Treatment: 15 GM carbohydrate snack  Symptoms: None  Follow-up CBG: Time: CBG Result82  Possible Reasons for Event: Inadequate meal intake  Comments/MD notified    Dandrae Kustra, Randall An RN  Remember to initiate Hypoglycemia Order Set & complete

## 2013-02-04 ENCOUNTER — Ambulatory Visit: Payer: Medicare Other | Admitting: Pulmonary Disease

## 2013-02-04 LAB — GLUCOSE, CAPILLARY
Glucose-Capillary: 109 mg/dL — ABNORMAL HIGH (ref 70–99)
Glucose-Capillary: 78 mg/dL (ref 70–99)

## 2013-02-04 MED ORDER — AMIODARONE HCL 200 MG PO TABS: 200.0000 mg | ORAL_TABLET | Freq: Two times a day (BID) | ORAL | Status: DC

## 2013-02-04 MED ORDER — ONETOUCH ULTRASOFT LANCETS MISC: Status: AC

## 2013-02-04 MED ORDER — GLUCOSE BLOOD VI STRP: ORAL_STRIP | Status: AC

## 2013-02-04 MED ORDER — FREESTYLE SYSTEM KIT: 1.0000 | PACK | Status: AC | PRN

## 2013-02-04 MED ORDER — METOPROLOL TARTRATE 25 MG PO TABS: 25.0000 mg | ORAL_TABLET | Freq: Two times a day (BID) | ORAL | Status: DC

## 2013-02-04 NOTE — Progress Notes (Signed)
Pt does not wish to go to CPAP at this time. Pt in chair watching tv. No distress noted. Pt will call RT when/if pt wants to go on CPAP.

## 2013-02-04 NOTE — Progress Notes (Signed)
CARDIAC REHAB PHASE I   PRE:  Rate/Rhythm: 61 sinus rhythm  BP:  Supine:   Sitting: 108/60  Standing:    SaO2: 94% ra  MODE:  Ambulation: 550 ft   POST:  Rate/Rhythem: 68 sinus rhythm  BP:  Supine:   Sitting: 110/64  Standing:    SaO2: 93% ra 1020-1050 Pt ambulated in hallway without difficulty.  Steady gait.  Pt returned to chair, call light in reach. Pt education completed.  Pt oriented to outpatient cardiac rehab.  Pt requests referral to [facility]. Referral will be sent.    Rion, [place]

## 2013-02-04 NOTE — Progress Notes (Addendum)
8 Days Post-Op Procedure(s) (LRB): CORONARY ARTERY BYPASS GRAFTING (CABG) x  two; using left internal mammary artery and right leg greater saphenous vein harvested endoscopically ([name]) MAZE ([name]) INTRAOPERATIVE TRANSESOPHAGEAL ECHOCARDIOGRAM ([name]) Subjective:  [name] has no complaints this morning.  He is ready to be discharged today.  He does request a prescription for a glucometer and supplies, stating he does not have one.  +BM  Objective: Vital signs in last 24 hours: Temp:  [98 F (36.7 C)-98.4 F (36.9 C)] 98.4 F (36.9 C) (03/07 0633) Pulse Rate:  [61-72] 67 (03/07 0633) Cardiac Rhythm:  [-] Normal sinus rhythm (03/07 0732) Resp:  [18-19] 18 (03/07 0633) BP: (109-136)/(46-67) 126/67 mmHg (03/07 0633) SpO2:  [92 %-96 %] 92 % (03/07 0633) Weight:  [245 lb 9.5 oz (111.4 kg)] 245 lb 9.5 oz (111.4 kg) (03/07 0650) Intake/Output from previous day: 03/06 0701 - 03/07 0700 In: 720 [P.O.:720] Out: -   General appearance: alert, cooperative and no distress Neurologic: intact Heart: regular rate and rhythm Lungs: clear to auscultation bilaterally Abdomen: soft, non-tender; bowel sounds normal; no masses,  no organomegaly Extremities: edema trace Wound: clean and dry  Lab Results: No results found for this basename: WBC, HGB, HCT, PLT,  in the last 72 hours BMET:  Recent Labs  02/03/13 0520  NA 127*  K 4.3  CL 91*  CO2 25  GLUCOSE 101*  BUN 21  CREATININE 1.12  CALCIUM 9.0    PT/INR: No results found for this basename: LABPROT, INR,  in the last 72 hours ABG    Component Value Date/Time   PHART 7.328* 01/27/2013 1958   HCO3 22.6 01/27/2013 1958   TCO2 25 01/28/2013 1646   ACIDBASEDEF 3.0* 01/27/2013 1958   O2SAT 92.0 01/27/2013 1958   CBG (last 3)   Recent Labs  02/03/13 1638 02/03/13 2045 02/04/13 0632  GLUCAP 114* 120* 109*    Assessment/Plan: S/P Procedure(s) (LRB): CORONARY ARTERY BYPASS GRAFTING (CABG) x  two; using left internal mammary artery  and right leg greater saphenous vein harvested endoscopically ([name]) MAZE ([name]) INTRAOPERATIVE TRANSESOPHAGEAL ECHOCARDIOGRAM ([name])  1. CV- Previous Atrial Fibrillation, currently NSR on Lopressor, AMiodarone, Xarelto 2. Pulm- no acute issues, continue IS at discharge 3. Volume status- appears stable, will give short course of Lasix at d/c 4. DM- CBGs controlled 5. Dispo- patient doing well, will d/c home today   LOS: 8 days    BARRETT, ERIN 02/04/2013  I have seen and examined the patient and agree with the assessment and plan as outlined.  [name],[name] 02/04/2013 11:13 AM

## 2013-02-04 NOTE — Progress Notes (Signed)
Chest tube sutures removed as ordered. Pt given discharge instructions and prescritptions will discharge home as ordered. Cloer, Randall An  rN

## 2013-02-07 NOTE — Telephone Encounter (Signed)
New Problem:    Patient called in wanting to be seen by Dr. Excell Seltzer before he goes in to see his cardiac surgeon on 3/24.  Please call back.  Patient was aware that you were out of the office when this message was taken.

## 2013-02-08 ENCOUNTER — Other Ambulatory Visit: Payer: Self-pay

## 2013-02-08 ENCOUNTER — Telehealth: Payer: Self-pay | Admitting: Cardiovascular Disease

## 2013-02-08 DIAGNOSIS — Z9889 Other specified postprocedural states: Secondary | ICD-10-CM

## 2013-02-08 DIAGNOSIS — I4891 Unspecified atrial fibrillation: Secondary | ICD-10-CM

## 2013-02-08 DIAGNOSIS — Z8679 Personal history of other diseases of the circulatory system: Secondary | ICD-10-CM

## 2013-02-08 MED ORDER — AMIODARONE HCL 200 MG PO TABS: 200.0000 mg | ORAL_TABLET | Freq: Two times a day (BID) | ORAL | Status: DC

## 2013-02-08 NOTE — Telephone Encounter (Signed)
RX for Amiodarone 200 mg po BID was e-prescribed to pt's pharm

## 2013-02-08 NOTE — Telephone Encounter (Signed)
Pt scheduled to see Dr Excell Seltzer on 02/18/13.  Pt's wife aware of appointment.

## 2013-02-08 NOTE — Telephone Encounter (Signed)
Pt calling

## 2013-02-17 ENCOUNTER — Other Ambulatory Visit: Payer: Self-pay | Admitting: *Deleted

## 2013-02-17 DIAGNOSIS — I2510 Atherosclerotic heart disease of native coronary artery without angina pectoris: Secondary | ICD-10-CM

## 2013-02-18 ENCOUNTER — Encounter: Payer: Self-pay | Admitting: Cardiovascular Disease

## 2013-02-18 ENCOUNTER — Ambulatory Visit (INDEPENDENT_AMBULATORY_CARE_PROVIDER_SITE_OTHER): Payer: Medicare Other | Admitting: Cardiovascular Disease

## 2013-02-18 VITALS — BP 117/70 | HR 51 | Ht 71.0 in | Wt 233.4 lb

## 2013-02-18 DIAGNOSIS — Z8679 Personal history of other diseases of the circulatory system: Secondary | ICD-10-CM

## 2013-02-18 DIAGNOSIS — I4891 Unspecified atrial fibrillation: Secondary | ICD-10-CM

## 2013-02-18 DIAGNOSIS — Z9889 Other specified postprocedural states: Secondary | ICD-10-CM

## 2013-02-18 MED ORDER — AMIODARONE HCL 200 MG PO TABS: 200.0000 mg | ORAL_TABLET | Freq: Every day | ORAL | Status: DC

## 2013-02-18 NOTE — Progress Notes (Signed)
HPI:  72 year old gentleman presenting for followup evaluation. He underwent 2 vessel CABG with maze procedure and left atrial appendage clipping on February 27. He was grafted with the LIMA to LAD and saphenous vein graft to distal right coronary artery. He has done very well since his surgery. He feels good and is back to his regular physical activities. He has mild discomfort around his sternotomy site, but only with physical activity. He has had no exertional chest pressure, dyspnea, lightheadedness, or palpitations. He brings in records of his blood glucoses [name] are consistently less than 130. He's lost 20 or 30 pounds since his discharge home from the hospital. He feels good and is very pleased with his progress since heart surgery.  Outpatient Encounter Prescriptions as of 02/18/2013  Medication Sig Dispense Refill  . amiodarone (PACERONE) 200 MG tablet Take 1 tablet (200 mg total) by mouth daily.  60 tablet  1  . aspirin EC 81 MG EC tablet Take 1 tablet (81 mg total) by mouth daily.      . cetirizine (ZYRTEC) 10 MG tablet Take 10 mg by mouth daily.       Marland Kitchen ezetimibe (ZETIA) 10 MG tablet Take 10 mg by mouth daily.      Marland Kitchen glucose blood (CHOICE DM FORA G20 TEST STRIPS) test strip Use as instructed  100 each  12  . glucose monitoring kit (FREESTYLE) monitoring kit 1 each by Does not apply route as needed for other.  1 each  0  . Lancets (ONETOUCH ULTRASOFT) lancets Use as instructed  100 each  12  . metFORMIN (GLUCOPHAGE) 500 MG tablet Take 250 mg by mouth daily with supper.       . metoprolol tartrate (LOPRESSOR) 25 MG tablet Take 1 tablet (25 mg total) by mouth 2 (two) times daily.  60 tablet  1  . Rivaroxaban (XARELTO) 20 MG TABS Take 1 tablet (20 mg total) by mouth daily.  30 tablet  6  . sitaGLIPtin (JANUVIA) 100 MG tablet Take 100 mg by mouth daily.      . [DISCONTINUED] amiodarone (PACERONE) 200 MG tablet Take 1 tablet (200 mg total) by mouth 2 (two) times daily after a meal.  60 tablet   1  . [DISCONTINUED] docusate sodium 100 MG CAPS Take 200 mg by mouth daily.  10 capsule    . [DISCONTINUED] furosemide (LASIX) 40 MG tablet Take 1 tablet (40 mg total) by mouth daily. For 7 Days  7 tablet  0  . [DISCONTINUED] oxyCODONE (OXY IR/ROXICODONE) 5 MG immediate release tablet Take 1-2 tablets (5-10 mg total) by mouth every 4 (four) hours as needed for pain.  40 tablet  0  . [DISCONTINUED] potassium chloride SA (K-DUR,KLOR-CON) 20 MEQ tablet Take 1 tablet (20 mEq total) by mouth daily. For 7 Days  7 tablet  0   No facility-administered encounter medications on file as of 02/18/2013.    Allergies  Allergen Reactions  . Asa Arthritis Strength-Antacid (Aspirin Buffered)     Bleeding tendencies   . Metformin And Related     headaches  . Statins     Aches     Past Medical History  Diagnosis Date  . Coronary artery disease   . Hyperlipidemia     takes Zetia daily  . Allergy   . Bronchitis   . Sleep apnea   . Cardiomyopathy, ischemic   . Obesity (BMI 30-39.9)   . 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 Complication of anesthesia     pt states he gets "crazy" after anesthesia  . Atrial fibrillation     persistent, failed DCCV;takes Metoprolol daily and taking Pacerone for 7days prior to surgery  . Hay fever     takes Zyrtec daily  . History of bronchitis     last time a year ago  . History of kidney stones   . Type II diabetes mellitus     takes Metformin and Januvia daily;pt states he is not a diabetic but pre diabetic  . 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 S/P Maze operation for atrial fibrillation 01/27/2013    Complete bilateral lesion set using bipolar radiofrequency and cryothermy ablation with clipping of LA appendage    ROS: Negative except as per HPI  BP 117/70  Pulse 51  Ht 5\' 11"  (1.803 m)  Wt 233 lb 6.4 oz (105.87 kg)  BMI 32.57 kg/m2  SpO2 98%  PHYSICAL EXAM: Pt is alert and  oriented, NAD HEENT: normal Neck: JVP - normal, carotids 2+= without bruits Lungs: CTA bilaterally CV: Bradycardic and regular without murmur or gallop Chest: Sternotomy site is well-healed Abd: soft, NT, Positive BS, no hepatomegaly Ext: no C/C/E, distal pulses intact and equal Skin: warm/dry no rash  EKG:  Sinus bradycardia 50 beats per minute, possible inferior infarct age undetermined.  ASSESSMENT AND PLAN: 1. Coronary artery disease, native vessel. He is doing well after recent coronary bypass surgery. He will continue aspirin 81 mg, metoprolol 25 mg twice daily, and zetia for lipid lowering as he is statin intolerant.  2. Atrial fibrillation. He is maintaining sinus rhythm after surgical Maze procedure. I asked him to reduce his amiodarone to 200 mg once daily. I would like to see him back in 4 weeks and we'll plan on discontinuing amiodarone at that time. If he remains in sinus rhythm I will also stop his Xarelto when he returns in one month.  For followup of see him back in one month. I applauded his efforts at weight loss and encouraged him to keep up the good work.  [name] 02/18/2013 1:27 PM

## 2013-02-18 NOTE — Patient Instructions (Addendum)
Decrease Amiodarone to 200mg  once daily.  Your physician recommends that you schedule a follow-up appointment in: 4 weeks with Dr. Excell Seltzer.

## 2013-02-21 ENCOUNTER — Ambulatory Visit (INDEPENDENT_AMBULATORY_CARE_PROVIDER_SITE_OTHER): Payer: Self-pay | Admitting: Thoracic Surgery (Cardiothoracic Vascular Surgery)

## 2013-02-21 ENCOUNTER — Encounter: Payer: Self-pay | Admitting: Thoracic Surgery (Cardiothoracic Vascular Surgery)

## 2013-02-21 ENCOUNTER — Other Ambulatory Visit: Payer: Self-pay

## 2013-02-21 ENCOUNTER — Ambulatory Visit
Admission: RE | Admit: 2013-02-21 | Discharge: 2013-02-21 | Disposition: A | Discharge status: Home / Self Care | Payer: Medicare Other | Source: Ambulatory Visit | Attending: Thoracic Surgery (Cardiothoracic Vascular Surgery) | Admitting: Thoracic Surgery (Cardiothoracic Vascular Surgery)

## 2013-02-21 VITALS — BP 131/72 | HR 57 | Resp 16 | Ht 71.0 in | Wt 223.8 lb

## 2013-02-21 DIAGNOSIS — J90 Pleural effusion, not elsewhere classified: Secondary | ICD-10-CM

## 2013-02-21 DIAGNOSIS — I2510 Atherosclerotic heart disease of native coronary artery without angina pectoris: Secondary | ICD-10-CM

## 2013-02-21 DIAGNOSIS — Z951 Presence of aortocoronary bypass graft: Secondary | ICD-10-CM

## 2013-02-21 DIAGNOSIS — Z9889 Other specified postprocedural states: Secondary | ICD-10-CM

## 2013-02-21 DIAGNOSIS — I4891 Unspecified atrial fibrillation: Secondary | ICD-10-CM

## 2013-02-21 DIAGNOSIS — Z8679 Personal history of other diseases of the circulatory system: Secondary | ICD-10-CM

## 2013-02-21 HISTORY — DX: Pleural effusion, not elsewhere classified: J90

## 2013-02-21 NOTE — Progress Notes (Signed)
[address]            [name] [address]          [phone]     CARDIOTHORACIC SURGERY OFFICE NOTE  Referring Provider is [name], MD PCP is Lorenda Peck, MD   HPI:  Patient returns for followup status post coronary artery bypass grafting x2 and Maze procedure on 01/27/2013. His postoperative recovery has been uncomplicated.  Since hospital discharge she has continued to do very well. He was seen in followup earlier this week by Dr. Excell Seltzer and his dose of amiodarone was cut to 200 mg daily. The patient reports that he has very mild residual soreness in his chest. He is no longer using any pain relievers. His breathing has continued to gradually improve although he does report some residual mild exertional shortness of breath. He is carefully been keeping track of his blood sugars and his weight. His glucose levels had been consistently below 130. His weight is continued to track down approximately 20 pounds below his baseline weight prior to surgery. He feels quite well. He has not been using his CPAP machine consistently at night because it seems to exacerbate his cough. As a result he has not been sleeping quite as well as normally. Otherwise she feels quite well.   Current Outpatient Prescriptions  Medication Sig Dispense Refill  . amiodarone (PACERONE) 200 MG tablet Take 1 tablet (200 mg total) by mouth daily.  60 tablet  1  . aspirin EC 81 MG EC tablet Take 1 tablet (81 mg total) by mouth daily.      . cetirizine (ZYRTEC) 10 MG tablet Take 10 mg by mouth daily.       Marland Kitchen ezetimibe (ZETIA) 10 MG tablet Take 10 mg by mouth daily.      Marland Kitchen glucose blood (CHOICE DM FORA G20 TEST STRIPS) test strip Use as instructed  100 each  12  . glucose monitoring kit (FREESTYLE) monitoring kit 1 each by Does not apply route as needed for other.  1 each  0  . Lancets (ONETOUCH ULTRASOFT) lancets Use as instructed  100 each  12  . metFORMIN (GLUCOPHAGE)  500 MG tablet Take 250 mg by mouth daily with supper.       . metoprolol tartrate (LOPRESSOR) 25 MG tablet Take 1 tablet (25 mg total) by mouth 2 (two) times daily.  60 tablet  1  . Rivaroxaban (XARELTO) 20 MG TABS Take 1 tablet (20 mg total) by mouth daily.  30 tablet  6  . sitaGLIPtin (JANUVIA) 100 MG tablet Take 100 mg by mouth daily.       No current facility-administered medications for this visit.      Physical Exam:   BP 131/72  Pulse 57  Resp 16  Ht 5\' 11"  (1.803 m)  Wt 223 lb 12.8 oz (101.515 kg)  BMI 31.23 kg/m2  SpO2 98%  General:  Well-appearing  Chest:   Clear to auscultation with slightly diminished breath sounds left lung base  CV:   Regular rate and rhythm  Incisions:  Clean and dry and healing nicely, sternum is stable  Abdomen:  Soft and nontender  Extremities:  Warm and well-perfused  Diagnostic Tests:  2 channel telemetry rhythm strip demonstrates normal sinus rhythm   *RADIOLOGY REPORT*  Clinical Data: Coronary artery disease.  CHEST - 2 VIEW  Comparison: February 03, 2013.  Findings: Sternotomy wires are noted. Right lung is clear.  Moderate left pleural effusion is unchanged compared to prior exam.  No pneumothorax is noted.  IMPRESSION:  Moderate left pleural effusion is unchanged compared to prior exam.  Original Report Authenticated By: Lupita Raider., M.D.   Impression:  The patient is doing very well approximately one month status post coronary artery bypass grafting x2 and Maze procedure. He is maintaining sinus rhythm. Clinically he is progressing fairly well although he does still have some dyspnea on exertion and a non-productive cough. Chest x-ray demonstrates a persistent moderate left pleural effusion.  Plan:  I've discussed options regarding management of the pleural effusion with the patient and his wife. We plan to proceed with ultrasound guided thoracentesis sometime this week. I think this will help him continued to progress.  Otherwise the patient is doing well and appears to be ready to start outpatient cardiac rehabilitation program. I agree with cutting his dose of amiodarone to 200 mg daily and would hope to discontinued amiodarone altogether within 6-8 weeks. He should remain anticoagulated for probably a minimum of 3 months from the time of surgery.  I've reminded the patient to continue to refrain from any heavy lifting or strenuous use of his arms or shoulders. I do think it is reasonable for him to resume driving an automobile. All of his questions been addressed.   Salvatore Decent. Cornelius Moras, MD 02/21/2013 3:04 PM

## 2013-02-21 NOTE — Patient Instructions (Addendum)
The patient should continue to avoid any heavy lifting or strenuous use of arms or shoulders for at least a total of three months from the time of surgery.  The patient may return to driving an automobile as long as they are no longer requiring oral narcotic pain relievers during the daytime.  It would be wise to start driving only short distances during the daylight and gradually increase from there as they feel comfortable.  The patient is encouraged to start the outpatient cardiac rehab program.

## 2013-02-22 ENCOUNTER — Ambulatory Visit (HOSPITAL_COMMUNITY)
Admission: RE | Admit: 2013-02-22 | Discharge: 2013-02-22 | Disposition: A | Discharge status: Home / Self Care | Payer: Medicare Other | Source: Ambulatory Visit | Attending: Thoracic Surgery (Cardiothoracic Vascular Surgery) | Admitting: Thoracic Surgery (Cardiothoracic Vascular Surgery)

## 2013-02-22 ENCOUNTER — Ambulatory Visit (HOSPITAL_COMMUNITY)
Admission: RE | Admit: 2013-02-22 | Discharge: 2013-02-22 | Disposition: A | Discharge status: Home / Self Care | Payer: Medicare Other | Source: Ambulatory Visit | Attending: Radiology | Admitting: Radiology

## 2013-02-22 DIAGNOSIS — J90 Pleural effusion, not elsewhere classified: Secondary | ICD-10-CM | POA: Insufficient documentation

## 2013-02-22 NOTE — Procedures (Signed)
Successful US guided left thoracentesis. Yielded 1.2L of bloody pleural fluid. Pt tolerated procedure well. No immediate complications.  Specimen was not sent for labs. CXR ordered.  Brayton El PA-C 02/22/2013 11:45 AM

## 2013-02-24 ENCOUNTER — Encounter: Payer: Self-pay | Admitting: Cardiovascular Disease

## 2013-03-03 ENCOUNTER — Encounter (HOSPITAL_COMMUNITY)
Admission: RE | Admit: 2013-03-03 | Discharge: 2013-03-03 | Disposition: A | Discharge status: Home / Self Care | Payer: Medicare Other | Source: Ambulatory Visit | Attending: Cardiovascular Disease | Admitting: Cardiovascular Disease

## 2013-03-03 DIAGNOSIS — Z951 Presence of aortocoronary bypass graft: Secondary | ICD-10-CM | POA: Insufficient documentation

## 2013-03-03 DIAGNOSIS — I2510 Atherosclerotic heart disease of native coronary artery without angina pectoris: Secondary | ICD-10-CM | POA: Insufficient documentation

## 2013-03-03 DIAGNOSIS — Z5189 Encounter for other specified aftercare: Secondary | ICD-10-CM | POA: Insufficient documentation

## 2013-03-07 ENCOUNTER — Encounter (HOSPITAL_COMMUNITY)
Admission: RE | Admit: 2013-03-07 | Discharge: 2013-03-07 | Disposition: A | Discharge status: Home / Self Care | Payer: Medicare Other | Source: Ambulatory Visit | Attending: Cardiovascular Disease | Admitting: Cardiovascular Disease

## 2013-03-07 LAB — GLUCOSE, CAPILLARY
Glucose-Capillary: 133 mg/dL — ABNORMAL HIGH (ref 70–99)
Glucose-Capillary: 99 mg/dL (ref 70–99)

## 2013-03-07 NOTE — Progress Notes (Signed)
Pt in today for his first cardiac rehab exercise session at 6:45 a.m. Exercise class. Monitor showed SR with no noted ectopy.  Pt tolerated exercise well with no complaints.  Continue to monitor. Alanson Aly, BSN

## 2013-03-07 NOTE — Progress Notes (Signed)
Nutrition Note Spoke with pt. Pt reportedly in Cardiac Rehab previously. Pt drinks Shakley soy protein mixed with skim milk daily along with his cereal/skim milk and a fresh piece of fruit. Pt's 24 hour food record reviewed with pt. Pt states his wt was 254 lb in the hospital. Pt's current wt is down 31.2 lbs over the past month. Pt feels "about half of that wt loss was fluid loss." Even if pt lost fluid, pt's rate of wt loss exceeds desirable rate of no more than 2 lbs/week. Pt states he feels like he lost muscle during the past month due to "not being able to go to the gym." Recommended rate of wt loss reviewed with pt. Pt anxious to return to his exercise routine. Importance of following post-op exercise guidelines for surgical healing discussed. Pt reports following his 5 lb lifting restriction "but it's tough." Pt is DM. Pt checks his CBG's TID before meals. Over the past month CBG's before breakfast 90-143 mg/dL, before lunch 16-109 mg/dL, and before dinner 60-454 mg/dL. Most CBG's WNL. Last A1c indicates blood glucose well-controlled. Pt states his A1c is usually "7 or less." Continue client-centered nutrition education by RD as part of interdisciplinary care.  Monitor and evaluate progress toward nutrition goal with team.  Mickle Plumb, M.Ed, RD, LDN, CDE 03/07/2013 10:28 AM Lab Results  Component Value Date   HGBA1C 6.9* 01/25/2013

## 2013-03-09 ENCOUNTER — Encounter (HOSPITAL_COMMUNITY)
Admission: RE | Admit: 2013-03-09 | Discharge: 2013-03-09 | Disposition: A | Discharge status: Home / Self Care | Payer: Medicare Other | Source: Ambulatory Visit | Attending: Cardiovascular Disease | Admitting: Cardiovascular Disease

## 2013-03-09 LAB — GLUCOSE, CAPILLARY
Glucose-Capillary: 143 mg/dL — ABNORMAL HIGH (ref 70–99)
Glucose-Capillary: 111 mg/dL — ABNORMAL HIGH (ref 70–99)

## 2013-03-11 ENCOUNTER — Encounter (HOSPITAL_COMMUNITY)
Admission: RE | Admit: 2013-03-11 | Discharge: 2013-03-11 | Disposition: A | Discharge status: Home / Self Care | Payer: Medicare Other | Source: Ambulatory Visit | Attending: Cardiovascular Disease | Admitting: Cardiovascular Disease

## 2013-03-14 ENCOUNTER — Encounter (HOSPITAL_COMMUNITY): Payer: Medicare Other

## 2013-03-14 ENCOUNTER — Telehealth: Payer: Self-pay | Admitting: Cardiovascular Disease

## 2013-03-14 NOTE — Telephone Encounter (Signed)
New Problem:    Patient called in wanting to speak with you about his heart.  Please call back.

## 2013-03-14 NOTE — Telephone Encounter (Signed)
I spoke with the pt and he sent a fax into the office in regards to his amiodarone causing side effects.  The pt feels like this medication is causing his face to turn red and feel warm, cough, SOB and lung pain. I made Dr Excell Seltzer aware and he recommended the pt stop Amiodarone.  I made the pt aware and he will keep his appointment on 03/21/13.

## 2013-03-14 NOTE — Telephone Encounter (Signed)
Follow up ° ° °Pt returning your call °

## 2013-03-14 NOTE — Telephone Encounter (Signed)
Left message on machine for pt to contact the office.   

## 2013-03-16 ENCOUNTER — Encounter (HOSPITAL_COMMUNITY)
Admission: RE | Admit: 2013-03-16 | Discharge: 2013-03-16 | Disposition: A | Discharge status: Home / Self Care | Payer: Medicare Other | Source: Ambulatory Visit | Attending: Cardiovascular Disease | Admitting: Cardiovascular Disease

## 2013-03-18 ENCOUNTER — Encounter (HOSPITAL_COMMUNITY)
Admission: RE | Admit: 2013-03-18 | Discharge: 2013-03-18 | Disposition: A | Discharge status: Home / Self Care | Payer: Medicare Other | Source: Ambulatory Visit | Attending: Cardiovascular Disease | Admitting: Cardiovascular Disease

## 2013-03-21 ENCOUNTER — Encounter: Payer: Self-pay | Admitting: Cardiovascular Disease

## 2013-03-21 ENCOUNTER — Ambulatory Visit (INDEPENDENT_AMBULATORY_CARE_PROVIDER_SITE_OTHER): Payer: Medicare Other | Admitting: Cardiovascular Disease

## 2013-03-21 ENCOUNTER — Encounter (HOSPITAL_COMMUNITY)
Admission: RE | Admit: 2013-03-21 | Discharge: 2013-03-21 | Disposition: A | Discharge status: Home / Self Care | Payer: Medicare Other | Source: Ambulatory Visit | Attending: Cardiovascular Disease | Admitting: Cardiovascular Disease

## 2013-03-21 VITALS — BP 110/70 | HR 60 | Ht 71.0 in | Wt 232.0 lb

## 2013-03-21 DIAGNOSIS — I4891 Unspecified atrial fibrillation: Secondary | ICD-10-CM

## 2013-03-21 DIAGNOSIS — I2510 Atherosclerotic heart disease of native coronary artery without angina pectoris: Secondary | ICD-10-CM

## 2013-03-21 NOTE — Progress Notes (Signed)
[name] 72 y.o. male Nutrition Note Spoke with pt. Nutrition Plan and Nutrition Survey goals reviewed with pt. Pt is following Step 2 of the Therapeutic Lifestyle Changes diet. Pt wants to lose wt. Per discussion, pt wt loss has been at a plateau for 3 weeks around 222#. Pt has not been exercising on his non-rehab days. Pt to discuss with Dr. Excell Seltzer re: returning to the gym today at his appointment. Wt loss tips reviewed. Pt is diabetic. Last A1c indicates blood glucose well-controlled. Pt checks his CBG's 1-3 times a day before meals. Fasting CBG's have been 101-131 mg/dL over the past 2 weeks. Pt expressed understanding of the information reviewed. Pt aware of nutrition education classes offered.  Nutrition Diagnosis   Food-and nutrition-related knowledge deficit related to lack of exposure to information as related to diagnosis of: ? CVD ? DM (A1c 6.9)   Obesity related to excessive energy intake as evidenced by a BMI of 33.7  Nutrition RX/ Estimated Daily Nutrition Needs for: wt loss  1700-2000 Kcal, 45-60 gm fat, 11-15 gm sat fat, 1.7-2.2 gm trans-fat, <1500 mg sodium, 250 gm CHO   Nutrition Intervention   Pt's individual nutrition plan reviewed with pt.   Benefits of adopting Therapeutic Lifestyle Changes discussed when Medficts reviewed.   Pt to attend the Portion Distortion class   Pt to attend the  ? Nutrition I class                     ? Nutrition II class        ? Diabetes Blitz class       ? Diabetes Q & A class - met 03/11/13   Continue client-centered nutrition education by RD, as part of interdisciplinary care. Goal(s)   Pt to identify food quantities necessary to achieve: ? wt loss to a goal wt of 206-224 lb (93.4-101.6 kg) at graduation from cardiac rehab.  Monitor and Evaluate progress toward nutrition goal with team. Nutrition Risk: Change to Moderate   Mickle Plumb, M.Ed, RD, LDN, CDE 03/21/2013 7:42 AM

## 2013-03-21 NOTE — Patient Instructions (Signed)
Your physician has requested that you have an echocardiogram in 4 WEEKS. Echocardiography is a painless test that uses sound waves to create images of your heart. It provides your doctor with information about the size and shape of your heart and how well your heart's chambers and valves are working. This procedure takes approximately one hour. There are no restrictions for this procedure.  Your physician recommends that you schedule a follow-up appointment in: 4 WEEKS with Dr Excell Seltzer  Your physician recommends that you continue on your current medications as directed. Please refer to the Current Medication list given to you today.

## 2013-03-21 NOTE — Progress Notes (Signed)
HPI:  72 year old gentleman returning for followup evaluation. He underwent 2 vessel CABG with Cryo/Cox maze procedure February 27. He has started cardiac rehabilitation and is doing well with that. He got up on one of his bulldozer yesterday.  He saw Dr. Cornelius Moras last month and underwent thoracentesis for treatment of a postoperative pleural effusion.  He's had some mild soreness in the chest, but overall is progressing well. He called in last week complaining of skin redness, cough, worsening shortness of breath, and blisters on the face. I stopped his amiodarone at that point and he returns today for followup. He is feeling much better. His breathing is improved. He denies exertional chest pain, chest pressure, or palpitations. He denies leg swelling, orthopnea, or PND. He notes easy bruising and would like to stop Xarelto as soon as he can.   Outpatient Encounter Prescriptions as of 03/21/2013  Medication Sig Dispense Refill  . aspirin EC 81 MG EC tablet Take 1 tablet (81 mg total) by mouth daily.      . cetirizine (ZYRTEC) 10 MG tablet Take 10 mg by mouth daily.       Marland Kitchen ezetimibe (ZETIA) 10 MG tablet Take 10 mg by mouth daily.      Marland Kitchen glucose blood (CHOICE DM FORA G20 TEST STRIPS) test strip Use as instructed  100 each  12  . glucose monitoring kit (FREESTYLE) monitoring kit 1 each by Does not apply route as needed for other.  1 each  0  . Lancets (ONETOUCH ULTRASOFT) lancets Use as instructed  100 each  12  . metFORMIN (GLUCOPHAGE) 500 MG tablet Take 250 mg by mouth daily with breakfast.       . metoprolol tartrate (LOPRESSOR) 25 MG tablet Take 1 tablet (25 mg total) by mouth 2 (two) times daily.  60 tablet  1  . Rivaroxaban (XARELTO) 20 MG TABS Take 1 tablet (20 mg total) by mouth daily.  30 tablet  6  . sitaGLIPtin (JANUVIA) 100 MG tablet Take 100 mg by mouth daily.       No facility-administered encounter medications on file as of 03/21/2013.    Allergies  Allergen Reactions  . Asa  Arthritis Strength-Antacid (Aspirin Buffered)     Bleeding tendencies   . Metformin And Related     headaches  . Statins     Aches     Past Medical History  Diagnosis Date  . Coronary artery disease   . Hyperlipidemia     takes Zetia daily  . Allergy   . Bronchitis   . Sleep apnea   . Cardiomyopathy, ischemic   . Obesity (BMI 30-39.9)   . 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 Complication of anesthesia     pt states he gets "crazy" after anesthesia  . Atrial fibrillation     persistent, failed DCCV;takes Metoprolol daily and taking Pacerone for 7days prior to surgery  . Hay fever     takes Zyrtec daily  . History of bronchitis     last time a year ago  . History of kidney stones   . Type II diabetes mellitus     takes Metformin and Januvia daily;pt states he is not a diabetic but pre diabetic  . 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 S/P Maze operation for atrial fibrillation 01/27/2013    Complete bilateral lesion set using bipolar  radiofrequency and cryothermy ablation with clipping of LA appendage  . Pleural effusion, left 02/21/2013    ROS: Negative except as per HPI  BP 110/70  Pulse 60  Ht 5\' 11"  (1.803 m)  Wt 105.235 kg (232 lb)  BMI 32.37 kg/m2  SpO2 94%  PHYSICAL EXAM: Pt is alert and oriented, pleasant male in NAD HEENT: normal Neck: JVP - normal, carotids 2+= without bruits Lungs: CTA bilaterally CV: RRR without murmur or gallop Abd: soft, NT, Positive BS, no hepatomegaly Ext: no C/C/E, distal pulses intact and equal Skin: warm/dry no rash  ASSESSMENT AND PLAN: 1. CAD status post CABG. He will continue on aspirin 81 mg daily. He statin intolerant and lipids are treated with zetia. He will followup in one month.  2. Atrial fibrillation. He is maintaining sinus rhythm after his Maze procedure. I advised that he needs to continue on anticoagulation for another 4 weeks. Will see him  back with an EKG at that time.  3. Cardiomyopathy. Recommend repeat echocardiogram when he returns for followup in one month to reassess left ventricular function. If he has persistent cardiomyopathy will need to be started on an ACE inhibitor. He's been resistant to taking more medications, but clearly would benefit from an ACE or ARB if he is willing to take one.  For follow-up I'll see him back in one month with an EKG and echo at that time.  [name] 03/21/2013 3:34 PM

## 2013-03-23 ENCOUNTER — Encounter (HOSPITAL_COMMUNITY)
Admission: RE | Admit: 2013-03-23 | Discharge: 2013-03-23 | Disposition: A | Discharge status: Home / Self Care | Payer: Medicare Other | Source: Ambulatory Visit | Attending: Cardiovascular Disease | Admitting: Cardiovascular Disease

## 2013-03-24 ENCOUNTER — Other Ambulatory Visit: Payer: Self-pay | Admitting: *Deleted

## 2013-03-24 DIAGNOSIS — I2510 Atherosclerotic heart disease of native coronary artery without angina pectoris: Secondary | ICD-10-CM

## 2013-03-25 ENCOUNTER — Encounter (HOSPITAL_COMMUNITY): Payer: Medicare Other

## 2013-03-28 ENCOUNTER — Ambulatory Visit (INDEPENDENT_AMBULATORY_CARE_PROVIDER_SITE_OTHER): Payer: Self-pay | Admitting: Thoracic Surgery (Cardiothoracic Vascular Surgery)

## 2013-03-28 ENCOUNTER — Encounter (HOSPITAL_COMMUNITY)
Admission: RE | Admit: 2013-03-28 | Discharge: 2013-03-28 | Disposition: A | Discharge status: Home / Self Care | Payer: Medicare Other | Source: Ambulatory Visit | Attending: Cardiovascular Disease | Admitting: Cardiovascular Disease

## 2013-03-28 ENCOUNTER — Encounter: Payer: Self-pay | Admitting: Thoracic Surgery (Cardiothoracic Vascular Surgery)

## 2013-03-28 ENCOUNTER — Ambulatory Visit
Admission: RE | Admit: 2013-03-28 | Discharge: 2013-03-28 | Disposition: A | Discharge status: Home / Self Care | Payer: Medicare Other | Source: Ambulatory Visit | Attending: Thoracic Surgery (Cardiothoracic Vascular Surgery) | Admitting: Thoracic Surgery (Cardiothoracic Vascular Surgery)

## 2013-03-28 VITALS — BP 93/56 | HR 65 | Resp 20 | Ht 71.0 in | Wt 223.0 lb

## 2013-03-28 DIAGNOSIS — J90 Pleural effusion, not elsewhere classified: Secondary | ICD-10-CM

## 2013-03-28 DIAGNOSIS — I2510 Atherosclerotic heart disease of native coronary artery without angina pectoris: Secondary | ICD-10-CM

## 2013-03-28 DIAGNOSIS — Z951 Presence of aortocoronary bypass graft: Secondary | ICD-10-CM

## 2013-03-28 DIAGNOSIS — Z9889 Other specified postprocedural states: Secondary | ICD-10-CM

## 2013-03-28 DIAGNOSIS — Z8679 Personal history of other diseases of the circulatory system: Secondary | ICD-10-CM

## 2013-03-28 NOTE — Progress Notes (Signed)
[address]            [name] [address]          [phone]     CARDIOTHORACIC SURGERY OFFICE NOTE  Referring Provider is [name], MD PCP is Lorenda Peck, MD   HPI:   Patient returns for followup status post coronary artery bypass grafting x2 and Maze procedure on 01/27/2013.  He was last seen here in the office on 02/21/2013 at which time he was noted to have a moderate left pleural effusion. He underwent thoracentesis without complication. Since then he has continued to do very well. A couple weeks ago he began to have problems with sensitivity in some light with skin redness and blisters on his face. Dr. Excell Seltzer stopped his amiodarone. Since then the symptoms have gradually resolved. Overall he is now doing exceptionally well. He is back at work and actively participating in the cardiac rehabilitation program.  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 He has only mild residual soreness in his chest. He has no shortness of breath nor any residual cough. He has no complaints.   Current Outpatient Prescriptions  Medication Sig Dispense Refill  . aspirin EC 81 MG EC tablet Take 1 tablet (81 mg total) by mouth daily.      . cetirizine (ZYRTEC) 10 MG tablet Take 10 mg by mouth daily.       Marland Kitchen ezetimibe (ZETIA) 10 MG tablet Take 10 mg by mouth daily.      Marland Kitchen glucose blood (CHOICE DM FORA G20 TEST STRIPS) test strip Use as instructed  100 each  12  . glucose monitoring kit (FREESTYLE) monitoring kit 1 each by Does not apply route as needed for other.  1 each  0  . Lancets (ONETOUCH ULTRASOFT) lancets Use as instructed  100 each  12  . metFORMIN (GLUCOPHAGE) 500 MG tablet Take 250 mg by mouth daily with breakfast.       . metoprolol tartrate (LOPRESSOR) 25 MG tablet Take 1 tablet (25 mg total) by mouth 2 (two) times daily.  60 tablet  1  . Rivaroxaban (XARELTO) 20 MG TABS Take 1 tablet (20 mg total) by mouth daily.  30 tablet  6  . sitaGLIPtin  (JANUVIA) 100 MG tablet Take 100 mg by mouth daily.       No current facility-administered medications for this visit.      Physical Exam:   BP 93/56  Pulse 65  Resp 20  Ht 5\' 11"  (1.803 m)  Wt 223 lb (101.152 kg)  BMI 31.12 kg/m2  SpO2 95%  General:  Well-appearing  Chest:   Clear to auscultation  CV:   Regular rate and rhythm  Incisions:  Healing nicely, sternum is stable  Abdomen:  Soft and nontender  Extremities:  Warm and well-perfused  Diagnostic Tests:  2 channel telemetry rhythm strip demonstrates normal sinus rhythm  *RADIOLOGY REPORT*  Clinical Data: Recent heart surgery, follow-up.  CHEST - 2 VIEW  Comparison: 02/22/2013.  Findings: Trachea is midline. Heart size normal. Lungs are  somewhat low in volume with minimal residual linear atelectasis or  scarring and tiny left pleural effusion or pleural thickening at  the left costophrenic angle. Lungs are otherwise clear.  IMPRESSION:  Minimal residual atelectasis/scar and tiny pleural fluid/thickening  at the left costophrenic angle.  Original Report Authenticated By: Leanna Battles, M.D.  Impression:  Patient is doing very well 2 months status post Maze procedure with two-vessel coronary artery bypass grafting. He is maintaining sinus rhythm. He is now off amiodarone.  His pleural effusion has resolved.  Plan:  The patient will continue to followup with Dr. Excell Seltzer who plans to see him next month with a repeat echocardiogram. We will have him return in 3-4 months for routine followup and rhythm check.   Salvatore Decent. Cornelius Moras, MD 03/28/2013 2:48 PM

## 2013-03-30 ENCOUNTER — Encounter (HOSPITAL_COMMUNITY)
Admission: RE | Admit: 2013-03-30 | Discharge: 2013-03-30 | Disposition: A | Discharge status: Home / Self Care | Payer: Medicare Other | Source: Ambulatory Visit | Attending: Cardiovascular Disease | Admitting: Cardiovascular Disease

## 2013-04-01 ENCOUNTER — Encounter (HOSPITAL_COMMUNITY): Payer: Medicare Other

## 2013-04-04 ENCOUNTER — Encounter (HOSPITAL_COMMUNITY)
Admission: RE | Admit: 2013-04-04 | Discharge: 2013-04-04 | Disposition: A | Discharge status: Home / Self Care | Payer: Medicare Other | Source: Ambulatory Visit | Attending: Cardiovascular Disease | Admitting: Cardiovascular Disease

## 2013-04-04 DIAGNOSIS — Z5189 Encounter for other specified aftercare: Secondary | ICD-10-CM | POA: Insufficient documentation

## 2013-04-04 DIAGNOSIS — I2510 Atherosclerotic heart disease of native coronary artery without angina pectoris: Secondary | ICD-10-CM | POA: Insufficient documentation

## 2013-04-04 DIAGNOSIS — Z951 Presence of aortocoronary bypass graft: Secondary | ICD-10-CM | POA: Insufficient documentation

## 2013-04-06 ENCOUNTER — Encounter (HOSPITAL_COMMUNITY)
Admission: RE | Admit: 2013-04-06 | Discharge: 2013-04-06 | Disposition: A | Discharge status: Home / Self Care | Payer: Medicare Other | Source: Ambulatory Visit | Attending: Cardiovascular Disease | Admitting: Cardiovascular Disease

## 2013-04-08 ENCOUNTER — Encounter (HOSPITAL_COMMUNITY)
Admission: RE | Admit: 2013-04-08 | Discharge: 2013-04-08 | Disposition: A | Discharge status: Home / Self Care | Payer: Medicare Other | Source: Ambulatory Visit | Attending: Cardiovascular Disease | Admitting: Cardiovascular Disease

## 2013-04-09 ENCOUNTER — Other Ambulatory Visit: Payer: Self-pay | Admitting: Physician Assistant

## 2013-04-11 ENCOUNTER — Encounter (HOSPITAL_COMMUNITY)
Admission: RE | Admit: 2013-04-11 | Discharge: 2013-04-11 | Disposition: A | Discharge status: Home / Self Care | Payer: Medicare Other | Source: Ambulatory Visit | Attending: Cardiovascular Disease | Admitting: Cardiovascular Disease

## 2013-04-13 ENCOUNTER — Encounter (HOSPITAL_COMMUNITY): Payer: Medicare Other

## 2013-04-15 ENCOUNTER — Encounter (HOSPITAL_COMMUNITY)
Admission: RE | Admit: 2013-04-15 | Discharge: 2013-04-15 | Disposition: A | Discharge status: Home / Self Care | Payer: Medicare Other | Source: Ambulatory Visit | Attending: Cardiovascular Disease | Admitting: Cardiovascular Disease

## 2013-04-18 ENCOUNTER — Encounter (HOSPITAL_COMMUNITY)
Admission: RE | Admit: 2013-04-18 | Discharge: 2013-04-18 | Disposition: A | Discharge status: Home / Self Care | Payer: Medicare Other | Source: Ambulatory Visit | Attending: Cardiovascular Disease | Admitting: Cardiovascular Disease

## 2013-04-20 ENCOUNTER — Encounter (HOSPITAL_COMMUNITY)
Admission: RE | Admit: 2013-04-20 | Discharge: 2013-04-20 | Disposition: A | Discharge status: Home / Self Care | Payer: Medicare Other | Source: Ambulatory Visit | Attending: Cardiovascular Disease | Admitting: Cardiovascular Disease

## 2013-04-22 ENCOUNTER — Encounter (HOSPITAL_COMMUNITY): Payer: Medicare Other

## 2013-04-25 ENCOUNTER — Encounter (HOSPITAL_COMMUNITY): Payer: Medicare Other

## 2013-04-26 ENCOUNTER — Ambulatory Visit (INDEPENDENT_AMBULATORY_CARE_PROVIDER_SITE_OTHER): Payer: Medicare Other | Admitting: Cardiovascular Disease

## 2013-04-26 ENCOUNTER — Ambulatory Visit (INDEPENDENT_AMBULATORY_CARE_PROVIDER_SITE_OTHER): Payer: Medicare Other | Admitting: Pulmonary Disease

## 2013-04-26 ENCOUNTER — Encounter: Payer: Self-pay | Admitting: Pulmonary Disease

## 2013-04-26 ENCOUNTER — Ambulatory Visit (HOSPITAL_COMMUNITY): Payer: Medicare Other | Attending: Cardiovascular Disease

## 2013-04-26 ENCOUNTER — Encounter: Payer: Self-pay | Admitting: Cardiovascular Disease

## 2013-04-26 VITALS — BP 118/72 | HR 61 | Temp 97.5°F | Ht 71.0 in | Wt 230.6 lb

## 2013-04-26 VITALS — BP 135/72 | HR 69 | Ht 71.0 in | Wt 236.0 lb

## 2013-04-26 DIAGNOSIS — I2510 Atherosclerotic heart disease of native coronary artery without angina pectoris: Secondary | ICD-10-CM

## 2013-04-26 DIAGNOSIS — I4891 Unspecified atrial fibrillation: Secondary | ICD-10-CM

## 2013-04-26 DIAGNOSIS — I059 Rheumatic mitral valve disease, unspecified: Secondary | ICD-10-CM | POA: Insufficient documentation

## 2013-04-26 DIAGNOSIS — E785 Hyperlipidemia, unspecified: Secondary | ICD-10-CM

## 2013-04-26 DIAGNOSIS — I379 Nonrheumatic pulmonary valve disorder, unspecified: Secondary | ICD-10-CM | POA: Insufficient documentation

## 2013-04-26 DIAGNOSIS — I079 Rheumatic tricuspid valve disease, unspecified: Secondary | ICD-10-CM | POA: Insufficient documentation

## 2013-04-26 DIAGNOSIS — I2589 Other forms of chronic ischemic heart disease: Secondary | ICD-10-CM | POA: Insufficient documentation

## 2013-04-26 DIAGNOSIS — I10 Essential (primary) hypertension: Secondary | ICD-10-CM

## 2013-04-26 DIAGNOSIS — E119 Type 2 diabetes mellitus without complications: Secondary | ICD-10-CM | POA: Insufficient documentation

## 2013-04-26 DIAGNOSIS — G4733 Obstructive sleep apnea (adult) (pediatric): Secondary | ICD-10-CM

## 2013-04-26 MED ORDER — METOPROLOL TARTRATE 25 MG PO TABS: 25.0000 mg | ORAL_TABLET | Freq: Two times a day (BID) | ORAL | Status: DC

## 2013-04-26 MED ORDER — LOSARTAN POTASSIUM 25 MG PO TABS: 25.0000 mg | ORAL_TABLET | Freq: Every day | ORAL | Status: DC

## 2013-04-26 NOTE — Assessment & Plan Note (Signed)
The patient is doing fairly well on CPAP once his "mucous issues" resolved.  He is having no issues with his mask fit or pressure, and I've encouraged him to work aggressively on weight loss.  He will followup with me in one year if stable.

## 2013-04-26 NOTE — Progress Notes (Signed)
  Subjective:    Patient ID: [name], male    DOB: [birth year], 72 y.o.   MRN: [id number]  HPI The patient comes in today for followup of his obstructive sleep apnea.  He has been restarted on CPAP, and preferred to stay on the automatic setting.  When he wears the device consistently, he sees great improvement in his sleep and daytime alertness.  He was having issues at one point with increased mucus which led to intolerance, but this is much improved since one of his medications has been changed.  He is now back on the CPAP regularly and doing well.   Review of Systems  Constitutional: Negative for fever and unexpected weight change.  HENT: Negative for ear pain, nosebleeds, congestion, sore throat, rhinorrhea, sneezing, trouble swallowing, dental problem, postnasal drip and sinus pressure.   Eyes: Negative for redness and itching.  Respiratory: Negative for cough, chest tightness, shortness of breath and wheezing.   Cardiovascular: Negative for palpitations and leg swelling.  Gastrointestinal: Negative for nausea and vomiting.  Genitourinary: Negative for dysuria.  Musculoskeletal: Negative for joint swelling.  Skin: Negative for rash.  Neurological: Negative for headaches.  Hematological: Does not bruise/bleed easily.  Psychiatric/Behavioral: Negative for dysphoric mood. The patient is not nervous/anxious.        Objective:   Physical Exam Obese male in no acute distress Nose without purulence or discharge noted No skin breakdown or pressure necrosis from the CPAP mask Neck without lymphadenopathy or thyromegaly Lower extremities without edema, no cyanosis Alert and oriented, moves all 4 extremities.       Assessment & Plan:

## 2013-04-26 NOTE — Patient Instructions (Addendum)
Your physician has recommended you make the following change in your medication: Stop Xarelto, Continue Aspirin, START Losartan 25mg  take one by mouth daily  Your physician recommends that you return for lab work in: 2-3 WEEKS (BMP), Lab open 7:30-4:30   Your physician wants you to follow-up in: 6 MONTHS with Dr Excell Seltzer.  You will receive a reminder letter in the mail two months in advance. If you don't receive a letter, please call our office to schedule the follow-up appointment.

## 2013-04-26 NOTE — Patient Instructions (Addendum)
Stay on cpap as much as possible, and call if you are having issues with tolerance Work on weight loss followup with me in one year if doing well.

## 2013-04-26 NOTE — Progress Notes (Signed)
HPI:  72 year old gentleman presenting for followup evaluation. Patient has coronary artery disease with remote myocardial infarction. He underwent 2 vessel CABG and cryo- Cox maze procedure in February 2014. He is maintained sinus rhythm since his surgery. He presents today for followup evaluation.  Overall he is doing very well. He didn't follow his diet over Memorial Day weekend and notes that he's gained a few pounds. He's been working hard over the last several weeks. He denies exertional chest pain or pressure. He is doing fine with cardiac rehabilitation. He's been doing some core exercises in addition to cardiac rehabilitation. He denies palpitations, leg swelling, orthopnea, PND, chest pain, or exertional dyspnea.  Outpatient Encounter Prescriptions as of 04/26/2013  Medication Sig Dispense Refill  . aspirin EC 81 MG EC tablet Take 1 tablet (81 mg total) by mouth daily.      . cetirizine (ZYRTEC) 10 MG tablet Take 10 mg by mouth daily.       Marland Kitchen ezetimibe (ZETIA) 10 MG tablet Take 10 mg by mouth daily.      Marland Kitchen glucose blood (CHOICE DM FORA G20 TEST STRIPS) test strip Use as instructed  100 each  12  . metFORMIN (GLUCOPHAGE) 500 MG tablet Take 250 mg by mouth daily with breakfast.       . metoprolol tartrate (LOPRESSOR) 25 MG tablet Take 1 tablet (25 mg total) by mouth 2 (two) times daily.  60 tablet  1  . Rivaroxaban (XARELTO) 20 MG TABS Take 1 tablet (20 mg total) by mouth daily.  30 tablet  6  . sitaGLIPtin (JANUVIA) 100 MG tablet Take 100 mg by mouth daily.      Marland Kitchen glucose monitoring kit (FREESTYLE) monitoring kit 1 each by Does not apply route as needed for other.  1 each  0  . Lancets (ONETOUCH ULTRASOFT) lancets Use as instructed  100 each  12   No facility-administered encounter medications on file as of 04/26/2013.    Allergies  Allergen Reactions  . Asa Arthritis Strength-Antacid (Aspirin Buffered)     Bleeding tendencies   . Metformin And Related     headaches  . Statins       Aches     Past Medical History  Diagnosis Date  . Coronary artery disease   . Hyperlipidemia     takes Zetia daily  . Allergy   . Bronchitis   . Sleep apnea   . Cardiomyopathy, ischemic   . Obesity (BMI 30-39.9)   . 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 Complication of anesthesia     pt states he gets "crazy" after anesthesia  . Atrial fibrillation     persistent, failed DCCV;takes Metoprolol daily and taking Pacerone for 7days prior to surgery  . Hay fever     takes Zyrtec daily  . History of bronchitis     last time a year ago  . History of kidney stones   . Type II diabetes mellitus     takes Metformin and Januvia daily;pt states he is not a diabetic but pre diabetic  . 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 S/P Maze operation for atrial fibrillation 01/27/2013    Complete bilateral lesion set using bipolar radiofrequency and cryothermy ablation with clipping of LA appendage  . Pleural effusion, left 02/21/2013    ROS: Negative except as per HPI  BP 135/72  Pulse 69  Ht 5\' 11"  (1.803 m)  Wt 107.049 kg (236 lb)  BMI 32.93 kg/m2  PHYSICAL EXAM: Pt is alert and oriented, NAD HEENT: normal Neck: JVP - normal, carotids 2+= without bruits Lungs: CTA bilaterally CV: RRR without murmur or gallop Abd: soft, NT, Positive BS, no hepatomegaly Ext: no C/C/E, distal pulses intact and equal Skin: warm/dry no rash  EKG:  Normal sinus rhythm 62 beats per minute, age indeterminate inferior infarction, otherwise within normal limits.  2D Echo 04/26/2013: Study Conclusions  - Left ventricle: The cavity size was normal. Wall thickness was increased in a pattern of mild LVH. Systolic function was normal. The estimated ejection fraction was in the range of 50% to 55%. Wall motion was normal; there were no regional wall motion abnormalities. Doppler parameters are consistent with high ventricular filling  pressure. - Mitral valve: Calcified annulus. - Left atrium: The atrium was mildly dilated. - Right atrium: The atrium was mildly dilated. - Pulmonary arteries: Systolic pressure was mildly increased. PA peak pressure: 32mm Hg (S).  ASSESSMENT AND PLAN: 1. Coronary artery disease, native vessel. The patient is stable without anginal symptoms. He has diabetes and should be on an ACE or ARB. I recommended that he start losartan 25 mg daily. He will otherwise continue his current program.  2. Atrial fibrillation. The patient maintained sinus rhythm after his surgical maze procedure. He is 3 months out from surgery. Will stop Xarelto and continue indefinite aspirin.  3. Cardiomyopathy. I personally reviewed his echo images done this morning. He's had nice recovery of LV function with coronary revascularization and maintenance of sinus rhythm. He will be started on an ARB as above. He'll continue on his beta blocker. I'll see him back for followup in 6 months. He understands that continued work on his diet and exercise programs are to be imperative.  [name] 04/26/2013 6:21 PM

## 2013-04-26 NOTE — Progress Notes (Signed)
Echocardiogram performed.  

## 2013-04-27 ENCOUNTER — Encounter (HOSPITAL_COMMUNITY)
Admission: RE | Admit: 2013-04-27 | Discharge: 2013-04-27 | Disposition: A | Discharge status: Home / Self Care | Payer: Medicare Other | Source: Ambulatory Visit | Attending: Cardiovascular Disease | Admitting: Cardiovascular Disease

## 2013-04-29 ENCOUNTER — Encounter (HOSPITAL_COMMUNITY)
Admission: RE | Admit: 2013-04-29 | Discharge: 2013-04-29 | Disposition: A | Discharge status: Home / Self Care | Payer: Medicare Other | Source: Ambulatory Visit | Attending: Cardiovascular Disease | Admitting: Cardiovascular Disease

## 2013-05-02 ENCOUNTER — Encounter (HOSPITAL_COMMUNITY)
Admission: RE | Admit: 2013-05-02 | Discharge: 2013-05-02 | Disposition: A | Discharge status: Home / Self Care | Payer: Medicare Other | Source: Ambulatory Visit | Attending: Cardiovascular Disease | Admitting: Cardiovascular Disease

## 2013-05-02 DIAGNOSIS — Z951 Presence of aortocoronary bypass graft: Secondary | ICD-10-CM | POA: Insufficient documentation

## 2013-05-02 DIAGNOSIS — Z5189 Encounter for other specified aftercare: Secondary | ICD-10-CM | POA: Insufficient documentation

## 2013-05-02 DIAGNOSIS — I2510 Atherosclerotic heart disease of native coronary artery without angina pectoris: Secondary | ICD-10-CM | POA: Insufficient documentation

## 2013-05-04 ENCOUNTER — Encounter (HOSPITAL_COMMUNITY)
Admission: RE | Admit: 2013-05-04 | Discharge: 2013-05-04 | Disposition: A | Discharge status: Home / Self Care | Payer: Medicare Other | Source: Ambulatory Visit | Attending: Cardiovascular Disease | Admitting: Cardiovascular Disease

## 2013-05-06 ENCOUNTER — Encounter (HOSPITAL_COMMUNITY)
Admission: RE | Admit: 2013-05-06 | Discharge: 2013-05-06 | Disposition: A | Discharge status: Home / Self Care | Payer: Medicare Other | Source: Ambulatory Visit | Attending: Cardiovascular Disease | Admitting: Cardiovascular Disease

## 2013-05-09 ENCOUNTER — Encounter (HOSPITAL_COMMUNITY)
Admission: RE | Admit: 2013-05-09 | Discharge: 2013-05-09 | Disposition: A | Discharge status: Home / Self Care | Payer: Medicare Other | Source: Ambulatory Visit | Attending: Cardiovascular Disease | Admitting: Cardiovascular Disease

## 2013-05-11 ENCOUNTER — Encounter (HOSPITAL_COMMUNITY)
Admission: RE | Admit: 2013-05-11 | Discharge: 2013-05-11 | Disposition: A | Discharge status: Home / Self Care | Payer: Medicare Other | Source: Ambulatory Visit | Attending: Cardiovascular Disease | Admitting: Cardiovascular Disease

## 2013-05-12 ENCOUNTER — Other Ambulatory Visit: Payer: Medicare Other

## 2013-05-13 ENCOUNTER — Encounter (HOSPITAL_COMMUNITY): Payer: Medicare Other

## 2013-05-16 ENCOUNTER — Encounter (HOSPITAL_COMMUNITY)
Admission: RE | Admit: 2013-05-16 | Discharge: 2013-05-16 | Disposition: A | Discharge status: Home / Self Care | Payer: Medicare Other | Source: Ambulatory Visit | Attending: Cardiovascular Disease | Admitting: Cardiovascular Disease

## 2013-05-18 ENCOUNTER — Encounter (HOSPITAL_COMMUNITY)
Admission: RE | Admit: 2013-05-18 | Discharge: 2013-05-18 | Disposition: A | Discharge status: Home / Self Care | Payer: Medicare Other | Source: Ambulatory Visit | Attending: Cardiovascular Disease | Admitting: Cardiovascular Disease

## 2013-05-20 ENCOUNTER — Encounter (HOSPITAL_COMMUNITY)
Admission: RE | Admit: 2013-05-20 | Discharge: 2013-05-20 | Disposition: A | Discharge status: Home / Self Care | Payer: Medicare Other | Source: Ambulatory Visit | Attending: Cardiovascular Disease | Admitting: Cardiovascular Disease

## 2013-05-23 ENCOUNTER — Encounter (HOSPITAL_COMMUNITY)
Admission: RE | Admit: 2013-05-23 | Discharge: 2013-05-23 | Disposition: A | Discharge status: Home / Self Care | Payer: Medicare Other | Source: Ambulatory Visit | Attending: Cardiovascular Disease | Admitting: Cardiovascular Disease

## 2013-05-25 ENCOUNTER — Encounter (HOSPITAL_COMMUNITY)
Admission: RE | Admit: 2013-05-25 | Discharge: 2013-05-25 | Disposition: A | Discharge status: Home / Self Care | Payer: Medicare Other | Source: Ambulatory Visit | Attending: Cardiovascular Disease | Admitting: Cardiovascular Disease

## 2013-05-27 ENCOUNTER — Ambulatory Visit (INDEPENDENT_AMBULATORY_CARE_PROVIDER_SITE_OTHER): Payer: Medicare Other | Admitting: *Deleted

## 2013-05-27 ENCOUNTER — Encounter (HOSPITAL_COMMUNITY)
Admission: RE | Admit: 2013-05-27 | Discharge: 2013-05-27 | Disposition: A | Discharge status: Home / Self Care | Payer: Medicare Other | Source: Ambulatory Visit | Attending: Cardiovascular Disease | Admitting: Cardiovascular Disease

## 2013-05-27 DIAGNOSIS — I10 Essential (primary) hypertension: Secondary | ICD-10-CM

## 2013-05-27 DIAGNOSIS — I2510 Atherosclerotic heart disease of native coronary artery without angina pectoris: Secondary | ICD-10-CM

## 2013-05-27 DIAGNOSIS — E785 Hyperlipidemia, unspecified: Secondary | ICD-10-CM

## 2013-05-27 DIAGNOSIS — I4891 Unspecified atrial fibrillation: Secondary | ICD-10-CM

## 2013-05-27 LAB — BASIC METABOLIC PANEL
BUN: 26 mg/dL — ABNORMAL HIGH (ref 6–23)
CO2: 26 mEq/L (ref 19–32)
Calcium: 9.3 mg/dL (ref 8.4–10.5)
Chloride: 104 mEq/L (ref 96–112)
Creatinine, Ser: 1.2 mg/dL (ref 0.4–1.5)
GFR: 64.85 mL/min (ref >60.00)
Glucose, Bld: 174 mg/dL — ABNORMAL HIGH (ref 70–99)
Potassium: 4.3 mEq/L (ref 3.5–5.1)
Sodium: 134 mEq/L — ABNORMAL LOW (ref 135–145)

## 2013-05-27 LAB — HEMOGLOBIN A1C: Hgb A1c MFr Bld: 6.7 % — ABNORMAL HIGH (ref 4.6–6.5)

## 2013-05-30 ENCOUNTER — Encounter (HOSPITAL_COMMUNITY)
Admission: RE | Admit: 2013-05-30 | Discharge: 2013-05-30 | Disposition: A | Discharge status: Home / Self Care | Payer: Medicare Other | Source: Ambulatory Visit | Attending: Cardiovascular Disease | Admitting: Cardiovascular Disease

## 2013-06-01 ENCOUNTER — Encounter (HOSPITAL_COMMUNITY)
Admission: RE | Admit: 2013-06-01 | Discharge: 2013-06-01 | Disposition: A | Discharge status: Home / Self Care | Payer: Medicare Other | Source: Ambulatory Visit | Attending: Cardiovascular Disease | Admitting: Cardiovascular Disease

## 2013-06-01 DIAGNOSIS — Z951 Presence of aortocoronary bypass graft: Secondary | ICD-10-CM | POA: Insufficient documentation

## 2013-06-01 DIAGNOSIS — Z5189 Encounter for other specified aftercare: Secondary | ICD-10-CM | POA: Insufficient documentation

## 2013-06-01 DIAGNOSIS — I2510 Atherosclerotic heart disease of native coronary artery without angina pectoris: Secondary | ICD-10-CM | POA: Insufficient documentation

## 2013-06-03 ENCOUNTER — Encounter (HOSPITAL_COMMUNITY): Admission: RE | Admit: 2013-06-03 | Payer: Medicare Other | Source: Ambulatory Visit

## 2013-06-06 ENCOUNTER — Encounter (HOSPITAL_COMMUNITY)
Admission: RE | Admit: 2013-06-06 | Discharge: 2013-06-06 | Disposition: A | Discharge status: Home / Self Care | Payer: Medicare Other | Source: Ambulatory Visit | Attending: Cardiovascular Disease | Admitting: Cardiovascular Disease

## 2013-06-08 ENCOUNTER — Encounter (HOSPITAL_COMMUNITY)
Admission: RE | Admit: 2013-06-08 | Discharge: 2013-06-08 | Disposition: A | Discharge status: Home / Self Care | Payer: Medicare Other | Source: Ambulatory Visit | Attending: Cardiovascular Disease | Admitting: Cardiovascular Disease

## 2013-06-10 ENCOUNTER — Encounter (HOSPITAL_COMMUNITY): Payer: Self-pay

## 2013-06-10 ENCOUNTER — Encounter (HOSPITAL_COMMUNITY)
Admission: RE | Admit: 2013-06-10 | Discharge: 2013-06-10 | Disposition: A | Discharge status: Home / Self Care | Payer: Medicare Other | Source: Ambulatory Visit | Attending: Cardiovascular Disease | Admitting: Cardiovascular Disease

## 2013-06-10 NOTE — Progress Notes (Signed)
Pt will graduate from cardiac rehab on 06/15/13 after completing 36 sessions.  Pt has made significant lifestyle changes and should be commended for his success.  Although pt has not met his weight loss goal he is persist ant  In continuing his efforts.  Pt has positive outlook with good coping skills.  Pt plans to exercise on his own at the gym and has requested to volunteer with cardiac rehab program.

## 2013-06-13 ENCOUNTER — Encounter (HOSPITAL_COMMUNITY)
Admission: RE | Admit: 2013-06-13 | Discharge: 2013-06-13 | Disposition: A | Discharge status: Home / Self Care | Payer: Medicare Other | Source: Ambulatory Visit | Attending: Cardiovascular Disease | Admitting: Cardiovascular Disease

## 2013-06-15 ENCOUNTER — Encounter (HOSPITAL_COMMUNITY)
Admission: RE | Admit: 2013-06-15 | Discharge: 2013-06-15 | Disposition: A | Discharge status: Home / Self Care | Payer: Medicare Other | Source: Ambulatory Visit | Attending: Cardiovascular Disease | Admitting: Cardiovascular Disease

## 2013-06-20 ENCOUNTER — Encounter: Payer: Self-pay | Admitting: Cardiovascular Disease

## 2013-06-28 ENCOUNTER — Encounter: Payer: Self-pay | Admitting: Cardiovascular Disease

## 2013-07-25 ENCOUNTER — Ambulatory Visit (INDEPENDENT_AMBULATORY_CARE_PROVIDER_SITE_OTHER): Payer: Medicare Other | Admitting: Thoracic Surgery (Cardiothoracic Vascular Surgery)

## 2013-07-25 ENCOUNTER — Encounter: Payer: Self-pay | Admitting: Thoracic Surgery (Cardiothoracic Vascular Surgery)

## 2013-07-25 ENCOUNTER — Ambulatory Visit: Payer: Medicare Other | Admitting: Thoracic Surgery (Cardiothoracic Vascular Surgery)

## 2013-07-25 VITALS — BP 124/76 | HR 80 | Resp 20 | Ht 71.0 in | Wt 236.0 lb

## 2013-07-25 DIAGNOSIS — Z951 Presence of aortocoronary bypass graft: Secondary | ICD-10-CM

## 2013-07-25 DIAGNOSIS — Z8679 Personal history of other diseases of the circulatory system: Secondary | ICD-10-CM

## 2013-07-25 DIAGNOSIS — Z9889 Other specified postprocedural states: Secondary | ICD-10-CM

## 2013-07-25 NOTE — Progress Notes (Signed)
[address]       [name] [address]             [phone]     CARDIOTHORACIC SURGERY OFFICE NOTE  Referring Provider is Burton Apley, MD PCP is Lorenda Peck, MD   HPI:  Patient returns for followup status post coronary artery bypass grafting x2 and Maze procedure on 01/27/2013.  He was last seen here in the office on 03/28/2013.  Since then he has continued to do quite well. He was seen in followup by Dr. Excell Seltzer the end of May at which time he underwent a followup echocardiogram. LV function looked good with ejection fraction estimated 50-55%.  He is maintaining sinus rhythm. He has been taken off of Xarelto. He reports that his diabetes remains under good control was last hemoglobin A1c less than 6.  Patient reports that he is now back to essentially unrestricted physical activity. He is not having any tachypalpitations. He has no shortness of breath. Overall he has no complaints.      Current Outpatient Prescriptions  Medication Sig Dispense Refill  . aspirin EC 81 MG EC tablet Take 1 tablet (81 mg total) by mouth daily.      . cetirizine (ZYRTEC) 10 MG tablet Take 10 mg by mouth daily.       Marland Kitchen ezetimibe (ZETIA) 10 MG tablet Take 10 mg by mouth daily.      Marland Kitchen glucose blood (CHOICE DM FORA G20 TEST STRIPS) test strip Use as instructed  100 each  12  . glucose monitoring kit (FREESTYLE) monitoring kit 1 each by Does not apply route as needed for other.  1 each  0  . Lancets (ONETOUCH ULTRASOFT) lancets Use as instructed  100 each  12  . losartan (COZAAR) 25 MG tablet Take 1 tablet (25 mg total) by mouth daily.  90 tablet  3  . metFORMIN (GLUCOPHAGE) 500 MG tablet Take 250 mg by mouth daily with breakfast.       . metoprolol tartrate (LOPRESSOR) 25 MG tablet Take 1 tablet (25 mg total) by mouth 2 (two) times daily.  180 tablet  3  . sitaGLIPtin (JANUVIA) 100 MG tablet Take 100 mg by mouth daily.       No current facility-administered medications  for this visit.      Physical Exam:   BP 124/76  Pulse 80  Resp 20  Ht 5\' 11"  (1.803 m)  Wt 236 lb (107.049 kg)  BMI 32.93 kg/m2  SpO2 96%  General:  Well-appearing  Chest:   Clear to auscultation with symmetrical breath sounds  CV:   Regular rate and rhythm without murmur  Incisions:  Sternotomy scar is healed completely and sternum is stable  Abdomen:  Soft and nontender  Extremities:  Warm and well-perfused  Diagnostic Tests:  Transthoracic Echocardiography  Patient:    [name], [name] MR #:       [id number] Study Date: 04/26/2013 Gender:     M Age:        72 Height:     180.3cm Weight:     105.2kg BSA:        2.17m^2 Pt. Status: Room:    ATTENDING    Karlene Lineman, Randa Evens  PERFORMING   [facility], [facility]  SONOGRAPHER  Philomena Course, RDCS cc:  ------------------------------------------------------------ LV EF: 50% -   55%  ------------------------------------------------------------  Indications:      Cardiomyopathy - ischemic 414.8.  ------------------------------------------------------------ History:   PMH:  Maze procedure. Thoracentesis, pleural effusion. Acquired from the patient and from the patient's chart.  Atrial fibrillation.  Coronary artery disease. Ischemic cardiomyopathy.  Risk factors:  Diabetes mellitus.   ------------------------------------------------------------ Study Conclusions  - Left ventricle: The cavity size was normal. Wall thickness   was increased in a pattern of mild LVH. Systolic function   was normal. The estimated ejection fraction was in the   range of 50% to 55%. Wall motion was normal; there were no   regional wall motion abnormalities. Doppler parameters are   consistent with high ventricular filling pressure. - Mitral valve: Calcified annulus. - Left atrium: The atrium was mildly dilated. - Right atrium: The atrium was mildly dilated. - Pulmonary arteries:  Systolic pressure was mildly   increased. PA peak pressure: 32mm Hg (S).  ------------------------------------------------------------ Labs, prior tests, procedures, and surgery: Coronary artery bypass grafting (January 27, 2013).  Transthoracic echocardiography.  M-mode, complete 2D, spectral Doppler, and color Doppler.  Height:  Height: 180.3cm. Height: 71in.  Weight:  Weight: 105.2kg. Weight: 231.5lb.  Body mass index:  BMI: 32.4kg/m^2.  Body surface area:    BSA: 2.26m^2.  Blood pressure:     110/70.  Patient status:  Outpatient.  Location:  [facility]  ------------------------------------------------------------  ------------------------------------------------------------ Left ventricle:  The cavity size was normal. Wall thickness was increased in a pattern of mild LVH. Systolic function was normal. The estimated ejection fraction was in the range of 50% to 55%. Wall motion was normal; there were no regional wall motion abnormalities. Doppler parameters are consistent with high ventricular filling pressure.  ------------------------------------------------------------ Aortic valve:   Trileaflet; mildly thickened leaflets. Cusp separation was normal.  Doppler:  Transvalvular velocity was within the normal range. There was no stenosis.  No regurgitation.  ------------------------------------------------------------ Aorta:  Aortic root: The aortic root was normal in size.  ------------------------------------------------------------ Mitral valve:   Calcified annulus. Leaflet separation was normal.  Doppler:  Transvalvular velocity was within the normal range. There was no evidence for stenosis.  Trivial regurgitation.    Peak gradient: 4mm Hg (D).  ------------------------------------------------------------ Left atrium:  The atrium was mildly dilated.  ------------------------------------------------------------ Right ventricle:  The cavity size was normal.  Systolic function was normal.  ------------------------------------------------------------ Pulmonic valve:    Structurally normal valve.   Cusp separation was normal.  Doppler:  Transvalvular velocity was within the normal range.  Trivial regurgitation.  ------------------------------------------------------------ Tricuspid valve:   Structurally normal valve.   Leaflet separation was normal.  Doppler:  Transvalvular velocity was within the normal range.  Mild regurgitation.  ------------------------------------------------------------ Pulmonary artery:   Systolic pressure was mildly increased.   ------------------------------------------------------------ Right atrium:  The atrium was mildly dilated.  ------------------------------------------------------------ Pericardium:  There was no pericardial effusion.  ------------------------------------------------------------ Systemic veins: Inferior vena cava: The vessel was normal in size; the respirophasic diameter changes were in the normal range (= 50%); findings are consistent with normal central venous pressure.  ------------------------------------------------------------  2D measurements        Normal  Doppler measurements    Norma Left ventricle                                         l LVID ED,   44.4 mm     43-52   Main pulmonary artery chord,  Pressure,   32 mm Hg    =30 PLAX                           S LVID ES,   28.5 mm     23-38   Left ventricle chord,                         Ea, lat    9.9 cm/s     ----- PLAX                           ann, tiss    4 FS, chord,   36 %      >29     DP PLAX                           E/Ea, lat  10.          ----- LVPW, ED   13.2 mm     ------  ann, tiss   66 IVS/LVPW   1.15        <1.3    DP ratio, ED                      Ea, med    6.0 cm/s     ----- Ventricular septum             ann, tiss    4 IVS, ED    15.2 mm     ------  DP LVOT                            E/Ea, med  17.          ----- Diam, S      26 mm     ------  ann, tiss   55 Area       5.31 cm^2   ------  DP Diam         26 mm     ------  LVOT Aorta                          Peak vel,  83. cm/s     ----- Root diam,   31 mm     ------  S            5 ED                             VTI, S     19. cm       ----- Left atrium                                 2 AP dim       49 mm     ------  HR          59 bpm      ----- AP dim     2.18 cm/m^2 <2.2    Stroke vol 101 ml       ----- index                                      .  9                                Cardiac      6 L/min    -----                                output                                Cardiac    2.7 L/(min-m -----                                index          ^2)                                Stroke     45. ml/m^2   -----                                index        3                                Mitral valve                                Peak E vel 106 cm/s     -----                                Peak A vel 68. cm/s     -----                                             6                                Decelerati 225 ms       150-2                                on time                 30                                Peak         4 mm Hg    -----                                gradient,                                D  Peak E/A   1.5          -----                                ratio                                Tricuspid valve                                Regurg     261 cm/s     -----                                peak vel                                Peak RV-RA  27 mm Hg    -----                                gradient,                                S                                Systemic veins                                Estimated    5 mm Hg    -----                                CVP                                Right ventricle                                 Pressure,   32 mm Hg    <30                               S                                Sa vel,    10. cm/s     -----                                lat ann,     7                                tiss DP   ------------------------------------------------------------ Prepared and Electronically Authenticated by  Olga Millers 2014-05-27T14:44:[id number]  Impression:  Patient is doing well more than 6 months following coronary artery bypass grafting and Maze procedure. He is maintaining sinus rhythm.  Plan:  Patient will return in 6 months for routine followup and rhythm check.   Salvatore Decent. Cornelius Moras, MD 07/25/2013 2:03 PM

## 2013-08-10 ENCOUNTER — Encounter: Payer: Self-pay | Admitting: Cardiovascular Disease

## 2013-08-21 ENCOUNTER — Emergency Department (HOSPITAL_BASED_OUTPATIENT_CLINIC_OR_DEPARTMENT_OTHER): Payer: Medicare Other

## 2013-08-21 ENCOUNTER — Emergency Department (HOSPITAL_BASED_OUTPATIENT_CLINIC_OR_DEPARTMENT_OTHER)
Admission: EM | Admit: 2013-08-21 | Discharge: 2013-08-21 | Disposition: A | Discharge status: Home / Self Care | Payer: Medicare Other | Attending: Emergency Medicine | Admitting: Emergency Medicine

## 2013-08-21 ENCOUNTER — Encounter (HOSPITAL_BASED_OUTPATIENT_CLINIC_OR_DEPARTMENT_OTHER): Payer: Self-pay | Admitting: Emergency Medicine

## 2013-08-21 DIAGNOSIS — J3489 Other specified disorders of nose and nasal sinuses: Secondary | ICD-10-CM | POA: Insufficient documentation

## 2013-08-21 DIAGNOSIS — Z7982 Long term (current) use of aspirin: Secondary | ICD-10-CM | POA: Insufficient documentation

## 2013-08-21 DIAGNOSIS — Z87891 Personal history of nicotine dependence: Secondary | ICD-10-CM | POA: Insufficient documentation

## 2013-08-21 DIAGNOSIS — S46909A Unspecified injury of unspecified muscle, fascia and tendon at shoulder and upper arm level, unspecified arm, initial encounter: Secondary | ICD-10-CM | POA: Insufficient documentation

## 2013-08-21 DIAGNOSIS — E669 Obesity, unspecified: Secondary | ICD-10-CM | POA: Insufficient documentation

## 2013-08-21 DIAGNOSIS — R296 Repeated falls: Secondary | ICD-10-CM | POA: Insufficient documentation

## 2013-08-21 DIAGNOSIS — I4891 Unspecified atrial fibrillation: Secondary | ICD-10-CM | POA: Insufficient documentation

## 2013-08-21 DIAGNOSIS — Y93E1 Activity, personal bathing and showering: Secondary | ICD-10-CM | POA: Insufficient documentation

## 2013-08-21 DIAGNOSIS — R6889 Other general symptoms and signs: Secondary | ICD-10-CM | POA: Insufficient documentation

## 2013-08-21 DIAGNOSIS — R42 Dizziness and giddiness: Secondary | ICD-10-CM | POA: Insufficient documentation

## 2013-08-21 DIAGNOSIS — Z79899 Other long term (current) drug therapy: Secondary | ICD-10-CM | POA: Insufficient documentation

## 2013-08-21 DIAGNOSIS — R110 Nausea: Secondary | ICD-10-CM | POA: Insufficient documentation

## 2013-08-21 DIAGNOSIS — Z87442 Personal history of urinary calculi: Secondary | ICD-10-CM | POA: Insufficient documentation

## 2013-08-21 DIAGNOSIS — I252 Old myocardial infarction: Secondary | ICD-10-CM | POA: Insufficient documentation

## 2013-08-21 DIAGNOSIS — E119 Type 2 diabetes mellitus without complications: Secondary | ICD-10-CM | POA: Insufficient documentation

## 2013-08-21 DIAGNOSIS — I2510 Atherosclerotic heart disease of native coronary artery without angina pectoris: Secondary | ICD-10-CM | POA: Insufficient documentation

## 2013-08-21 DIAGNOSIS — Z951 Presence of aortocoronary bypass graft: Secondary | ICD-10-CM | POA: Insufficient documentation

## 2013-08-21 DIAGNOSIS — S4980XA Other specified injuries of shoulder and upper arm, unspecified arm, initial encounter: Secondary | ICD-10-CM | POA: Insufficient documentation

## 2013-08-21 DIAGNOSIS — Z9861 Coronary angioplasty status: Secondary | ICD-10-CM | POA: Insufficient documentation

## 2013-08-21 DIAGNOSIS — Y9289 Other specified places as the place of occurrence of the external cause: Secondary | ICD-10-CM | POA: Insufficient documentation

## 2013-08-21 DIAGNOSIS — Z8709 Personal history of other diseases of the respiratory system: Secondary | ICD-10-CM | POA: Insufficient documentation

## 2013-08-21 DIAGNOSIS — E785 Hyperlipidemia, unspecified: Secondary | ICD-10-CM | POA: Insufficient documentation

## 2013-08-21 DIAGNOSIS — Z8669 Personal history of other diseases of the nervous system and sense organs: Secondary | ICD-10-CM | POA: Insufficient documentation

## 2013-08-21 LAB — COMPREHENSIVE METABOLIC PANEL
ALT: 20 U/L (ref 0–53)
AST: 17 U/L (ref 0–37)
Albumin: 3.8 g/dL (ref 3.5–5.2)
Alkaline Phosphatase: 56 U/L (ref 39–117)
BUN: 23 mg/dL (ref 6–23)
CO2: 25 mEq/L (ref 19–32)
Calcium: 9.6 mg/dL (ref 8.4–10.5)
Chloride: 102 mEq/L (ref 96–112)
Creatinine, Ser: 1 mg/dL (ref 0.50–1.35)
GFR calc Af Amer: 86 mL/min — ABNORMAL LOW (ref >90)
GFR calc non Af Amer: 74 mL/min — ABNORMAL LOW (ref >90)
Glucose, Bld: 149 mg/dL — ABNORMAL HIGH (ref 70–99)
Potassium: 4.2 mEq/L (ref 3.5–5.1)
Sodium: 136 mEq/L (ref 135–145)
Total Bilirubin: 0.4 mg/dL (ref 0.3–1.2)
Total Protein: 6.9 g/dL (ref 6.0–8.3)

## 2013-08-21 LAB — URINALYSIS, ROUTINE W REFLEX MICROSCOPIC
Bilirubin Urine: NEGATIVE
Glucose, UA: NEGATIVE mg/dL
Ketones, ur: NEGATIVE mg/dL
Leukocytes, UA: NEGATIVE
Nitrite: NEGATIVE
Protein, ur: NEGATIVE mg/dL
Specific Gravity, Urine: 1.022 (ref 1.005–1.030)
Urobilinogen, UA: 0.2 mg/dL (ref 0.0–1.0)
pH: 5.5 (ref 5.0–8.0)

## 2013-08-21 LAB — CBC WITH DIFFERENTIAL/PLATELET
Basophils Absolute: 0 10*3/uL (ref 0.0–0.1)
Basophils Relative: 1 % (ref 0–1)
Eosinophils Absolute: 0.3 10*3/uL (ref 0.0–0.7)
Eosinophils Relative: 5 % (ref 0–5)
HCT: 44.2 % (ref 39.0–52.0)
Hemoglobin: 15.2 g/dL (ref 13.0–17.0)
Lymphocytes Relative: 19 % (ref 12–46)
Lymphs Abs: 1.2 10*3/uL (ref 0.7–4.0)
MCH: 30.8 pg (ref 26.0–34.0)
MCHC: 34.4 g/dL (ref 30.0–36.0)
MCV: 89.7 fL (ref 78.0–100.0)
Monocytes Absolute: 0.7 10*3/uL (ref 0.1–1.0)
Monocytes Relative: 11 % (ref 3–12)
Neutro Abs: 4 10*3/uL (ref 1.7–7.7)
Neutrophils Relative %: 64 % (ref 43–77)
Platelets: 151 10*3/uL (ref 150–400)
RBC: 4.93 MIL/uL (ref 4.22–5.81)
RDW: 13.2 % (ref 11.5–15.5)
WBC: 6.2 10*3/uL (ref 4.0–10.5)

## 2013-08-21 LAB — URINE MICROSCOPIC-ADD ON

## 2013-08-21 MED ORDER — MECLIZINE HCL 25 MG PO TABS
25.0000 mg | ORAL_TABLET | Freq: Once | ORAL | Status: AC
Start: 2013-08-21 — End: 2013-08-21
  Administered 2013-08-21: 25 mg via ORAL
  Filled 2013-08-21: qty 1

## 2013-08-21 MED ORDER — MECLIZINE HCL 25 MG PO TABS: 25.0000 mg | ORAL_TABLET | Freq: Three times a day (TID) | ORAL | Status: DC | PRN

## 2013-08-21 NOTE — ED Provider Notes (Signed)
CSN: [id number]     Arrival date & time 08/21/13  0736 History   First MD Initiated Contact with Patient 08/21/13 [phone]     Chief Complaint  Patient presents with  . Dizziness   (Consider location/radiation/quality/duration/timing/severity/associated sxs/prior Treatment) The history is provided by the patient.   72-year-old male history of diabetes. History of coronary artery disease. Acute onset of dizziness with room spinning at 3 AM. Patient continued to have the dizziness with movement of his head likely with leaning over and lying down. Also had episode while driving in here without any down. No syncope no loss of consciousness. Some mild nausea. Patient lost his balance due to this dizziness vertigo in the shower and fell upon his right shoulder. Region during an old rotator cuff injury to his shoulder. Patient denies any focal motor weakness or any sensory changes. Denies any visual changes. Denies any speech changes. Patient without history of stroke in the past. Patient has some congestion but this is due to seasonal allergies hayfever not an upper respiratory infection. No hearing or ear changes.  Past Medical History  Diagnosis Date  . Coronary artery disease   . Hyperlipidemia     takes Zetia daily  . Allergy   . Bronchitis   . Sleep apnea   . Cardiomyopathy, ischemic   . Obesity (BMI 30-39.9)   . 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 Complication of anesthesia     pt states he gets "crazy" after anesthesia  . Atrial fibrillation     persistent, failed DCCV;takes Metoprolol daily and taking Pacerone for 7days prior to surgery  . Hay fever     takes Zyrtec daily  . History of bronchitis     last time a year ago  . History of kidney stones   . Type II diabetes mellitus     takes Metformin and Januvia daily;pt states he is not a diabetic but pre diabetic  . 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 S/P Maze  operation for atrial fibrillation 01/27/2013    Complete bilateral lesion set using bipolar radiofrequency and cryothermy ablation with clipping of LA appendage  . Pleural effusion, left 02/21/2013   Past Surgical History  Procedure Laterality Date  . Tonsillectomy  1952  . Nasal septum surgery  2004    and sinus repair penta  . Umbilical hernia repair  01/2010  . Hernia repair  12/18/2009    Incarcerated umbilical hernia  . Cardioversion  11/30/2012    Procedure: CARDIOVERSION;  Surgeon: [name], MD;  Location: [facility] ENDOSCOPY;  Service: Cardiovascular;  Laterality: [name];  . Cystoscopy    . Skin taken off of top of mouth and reapplied to gum    . Coronary angioplasty with stent placement  01/17/13 and in 2008    1 stent placed in 2008  . Coronary artery bypass graft [name] 01/27/2013    Procedure: CORONARY ARTERY BYPASS GRAFTING (CABG) x  two; using left internal mammary artery and right leg greater saphenous vein harvested endoscopically;  Surgeon: Purcell Nails, MD;  Location: [facility] OR;  Service: Open Heart Surgery;  Laterality: [name];  . Maze [name] 01/27/2013    Procedure: MAZE;  Surgeon: Purcell Nails, MD;  Location: [facility] OR;  Service: Open Heart Surgery;  Laterality: [name];  . Intraoperative transesophageal echocardiogram [name] 01/27/2013    Procedure: INTRAOPERATIVE TRANSESOPHAGEAL ECHOCARDIOGRAM;  Surgeon: Purcell Nails, MD;  Location: [facility] OR;  Service: Open Heart Surgery;  Laterality: [name];   Family History  Problem Relation Age of Onset  . Heart disease Mother   . Diabetes Father   . Lung disease Father   . Cancer Father     colon   History  Substance Use Topics  . Smoking status: Former Games developer  . Smokeless tobacco: Never Used     Comment: quit in 1980  . Alcohol Use: Yes     Comment: beer occasionally    Review of Systems  Constitutional: Negative for fever.  HENT: Positive for congestion and sneezing. Negative for hearing loss, ear pain, neck pain and tinnitus.   Eyes:  Negative for redness and visual disturbance.  Respiratory: Negative for shortness of breath.   Cardiovascular: Negative for chest pain.  Gastrointestinal: Positive for nausea. Negative for vomiting and abdominal pain.  Musculoskeletal: Positive for arthralgias.  Skin: Negative for rash.  Allergic/Immunologic: Positive for environmental allergies.  Neurological: Positive for dizziness. Negative for seizures, facial asymmetry, speech difficulty, weakness, numbness and headaches.  Hematological: Does not bruise/bleed easily.  Psychiatric/Behavioral: Negative for confusion.    Allergies  Asa arthritis strength-antacid; Metformin and related; and Statins  Home Medications   Current Outpatient Rx  Name  Route  Sig  Dispense  Refill  . aspirin EC 81 MG EC tablet   Oral   Take 1 tablet (81 mg total) by mouth daily.         . cetirizine (ZYRTEC) 10 MG tablet   Oral   Take 10 mg by mouth daily.          Marland Kitchen ezetimibe (ZETIA) 10 MG tablet   Oral   Take 10 mg by mouth daily.         Marland Kitchen glucose blood (CHOICE DM FORA G20 TEST STRIPS) test strip      Use as instructed   100 each   12     Patient may choose brand that associates with sele ...   . glucose monitoring kit (FREESTYLE) monitoring kit   Does not apply   1 each by Does not apply route as needed for other.   1 each   0     Patient may choose make and model that he prefers   . Lancets (ONETOUCH ULTRASOFT) lancets      Use as instructed   100 each   12   . losartan (COZAAR) 25 MG tablet   Oral   Take 1 tablet (25 mg total) by mouth daily.   90 tablet   3   . meclizine (ANTIVERT) 25 MG tablet   Oral   Take 1 tablet (25 mg total) by mouth 3 (three) times daily as needed.   30 tablet   0   . metFORMIN (GLUCOPHAGE) 500 MG tablet   Oral   Take 250 mg by mouth daily with breakfast.          . metoprolol tartrate (LOPRESSOR) 25 MG tablet   Oral   Take 1 tablet (25 mg total) by mouth 2 (two) times daily.    180 tablet   3   . sitaGLIPtin (JANUVIA) 100 MG tablet   Oral   Take 100 mg by mouth daily.          BP 114/78  Pulse 62  Temp(Src) 98.3 F (36.8 C) (Oral)  Resp 18  SpO2 95% Physical Exam  Nursing note and vitals reviewed. Constitutional: He is  oriented to person, place, and time. He appears well-developed and well-nourished. No distress.  HENT:  Head: Normocephalic and atraumatic.  Mouth/Throat: Oropharynx is clear and moist.  Eyes: Conjunctivae and EOM are normal. Pupils are equal, round, and reactive to light.  Neck: Normal range of motion. Neck supple.  Cardiovascular: Normal rate and regular rhythm.   No murmur heard. Pulmonary/Chest: Effort normal and breath sounds normal. No respiratory distress. He has no rales.  Abdominal: Soft. Bowel sounds are normal. There is no tenderness.  Musculoskeletal: Normal range of motion. He exhibits tenderness.  Tenderness to palpation to the right shoulder area no deformity. Radial pulse distally 2+.  Lymphadenopathy:    He has no cervical adenopathy.  Neurological: He is alert and oriented to person, place, and time. No cranial nerve deficit. He exhibits normal muscle tone. Coordination normal.  Skin: Skin is warm. No rash noted.    ED Course  Procedures (including critical care time) Labs Review Labs Reviewed  COMPREHENSIVE METABOLIC PANEL - Abnormal; Notable for the following:    Glucose, Bld 149 (*)    GFR calc non Af Amer 74 (*)    GFR calc Af Amer 86 (*)    All other components within normal limits  URINALYSIS, ROUTINE W REFLEX MICROSCOPIC - Abnormal; Notable for the following:    Hgb urine dipstick TRACE (*)    All other components within normal limits  URINE MICROSCOPIC-ADD ON - Abnormal; Notable for the following:    Casts HYALINE CASTS (*)    All other components within normal limits  CBC WITH DIFFERENTIAL    Results for orders placed during the hospital encounter of 08/21/13  COMPREHENSIVE METABOLIC PANEL       Result Value Range   Sodium 136  135 - 145 mEq/L   Potassium 4.2  3.5 - 5.1 mEq/L   Chloride 102  96 - 112 mEq/L   CO2 25  19 - 32 mEq/L   Glucose, Bld 149 (*) 70 - 99 mg/dL   BUN 23  6 - 23 mg/dL   Creatinine, Ser 1.61  0.50 - 1.35 mg/dL   Calcium 9.6  8.4 - 09.6 mg/dL   Total Protein 6.9  6.0 - 8.3 g/dL   Albumin 3.8  3.5 - 5.2 g/dL   AST 17  0 - 37 U/L   ALT 20  0 - 53 U/L   Alkaline Phosphatase 56  39 - 117 U/L   Total Bilirubin 0.4  0.3 - 1.2 mg/dL   GFR calc non Af Amer 74 (*) >90 mL/min   GFR calc Af Amer 86 (*) >90 mL/min  CBC WITH DIFFERENTIAL      Result Value Range   WBC 6.2  4.0 - 10.5 K/uL   RBC 4.93  4.22 - 5.81 MIL/uL   Hemoglobin 15.2  13.0 - 17.0 g/dL   HCT 04.5  40.9 - 81.1 %   MCV 89.7  78.0 - 100.0 fL   MCH 30.8  26.0 - 34.0 pg   MCHC 34.4  30.0 - 36.0 g/dL   RDW 91.4  78.2 - 95.6 %   Platelets 151  150 - 400 K/uL   Neutrophils Relative % 64  43 - 77 %   Neutro Abs 4.0  1.7 - 7.7 K/uL   Lymphocytes Relative 19  12 - 46 %   Lymphs Abs 1.2  0.7 - 4.0 K/uL   Monocytes Relative 11  3 - 12 %   Monocytes Absolute 0.7  0.1 -  1.0 K/uL   Eosinophils Relative 5  0 - 5 %   Eosinophils Absolute 0.3  0.0 - 0.7 K/uL   Basophils Relative 1  0 - 1 %   Basophils Absolute 0.0  0.0 - 0.1 K/uL  URINALYSIS, ROUTINE W REFLEX MICROSCOPIC      Result Value Range   Color, Urine YELLOW  YELLOW   APPearance CLEAR  CLEAR   Specific Gravity, Urine 1.022  1.005 - 1.030   pH 5.5  5.0 - 8.0   Glucose, UA NEGATIVE  NEGATIVE mg/dL   Hgb urine dipstick TRACE (*) NEGATIVE   Bilirubin Urine NEGATIVE  NEGATIVE   Ketones, ur NEGATIVE  NEGATIVE mg/dL   Protein, ur NEGATIVE  NEGATIVE mg/dL   Urobilinogen, UA 0.2  0.0 - 1.0 mg/dL   Nitrite NEGATIVE  NEGATIVE   Leukocytes, UA NEGATIVE  NEGATIVE  URINE MICROSCOPIC-ADD ON      Result Value Range   Squamous Epithelial / LPF RARE  RARE   WBC, UA 0-2  <3 WBC/hpf   RBC / HPF 0-2  <3 RBC/hpf   Bacteria, UA RARE  RARE   Casts HYALINE CASTS  (*) NEGATIVE   Urine-Other MUCOUS PRESENT       Date: 08/21/2013  Rate: 67  Rhythm: normal sinus rhythm  QRS Axis: normal  Intervals: normal  ST/T Wave abnormalities: nonspecific T wave changes  Conduction Disutrbances:none  Narrative Interpretation:   Old EKG Reviewed: changes noted Previous EKG from her worry 20 05/20/2013 raise concerns for anterior lateral MI. No evidence of that today.  Imaging Review Dg Chest 2 View  08/21/2013   *RADIOLOGY REPORT*  Clinical Data: Nausea, dizziness, coronary artery disease, diabetes  CHEST - 2 VIEW  Comparison: March 28, 2013  Findings: There is no focal infiltrate, pulmonary edema, or pleural effusion.  The mediastinal contour cardiac silhouette are stable. The patient is status post prior mediastinotomy.  The soft tissues and osseous structures are stable.  IMPRESSION: No acute cardiopulmonary disease identified.   Original Report Authenticated By: Sherian Rein, M.D.   Ct Head Wo Contrast  08/21/2013   CLINICAL DATA:  Dizziness, nausea, status post fall  EXAM: CT HEAD WITHOUT CONTRAST  TECHNIQUE: Contiguous axial images were obtained from the base of the skull through the vertex without intravenous contrast.  COMPARISON:  08/19/2006  FINDINGS: No evidence of parenchymal hemorrhage or extra-axial fluid collection. No mass lesion, mass effect, or midline shift.  No CT evidence of acute infarction. 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  Subcortical white matter and periventricular small vessel ischemic changes.  Global cortical atrophy. No ventriculomegaly.  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  No evidence of calvarial fracture.  IMPRESSION: No evidence of acute intracranial abnormality.  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   Electronically Signed   By: Charline Bills M.D.   On: 08/21/2013 08:27    MDM   1. Vertigo    Patient with acute onset of vertigo  symptoms. That are very intermittent. Patient given Antivert here was up and about no recurrent symptoms. Patient CT shows evidence of an old lacunar infarct. One in to do MRI today to completely rule out stroke as the cause of the vertigo. Not convinced completely that this is benign positional. Patient refuses transfer for MRI states that he'll followup can get an MRI on his own. Patient understands the potential risk of missing a stroke. Patient will be  discharged home on vertigo. Patient does have a primary care Dr. Eugenio Hoes of workup was negative chest x-ray showed no pneumonia pulmonary edema or acute findings. EKG had no acute changes. Lab work showed no significant abnormalities no leukocytosis no anemia urinalysis negative for urinary tract infection.  Patient with old injury to his right shoulder reinjury today in the shower when he fell patient refused x-rays of this area says it's an old rotator cuff injury that he does aggravated.  Shelda Jakes, MD 08/21/13 1025

## 2013-08-21 NOTE — ED Notes (Signed)
IV taken out per Marva, RN's request. Site looks good, no swelling or redness noted.

## 2013-08-21 NOTE — ED Notes (Signed)
Pt states he went to the bathroom at 3 am, came back to bed and started to have dizziness.  Pt states he continues to have dizziness with movement of his head, leaning over or lying down.  No LOC.  Some nausea.  Pt lost his balance in the shower, fell upon his right shoulder.  Denies chest pain or sob.

## 2013-10-05 ENCOUNTER — Ambulatory Visit (INDEPENDENT_AMBULATORY_CARE_PROVIDER_SITE_OTHER): Payer: Medicare Other | Admitting: Cardiovascular Disease

## 2013-10-05 ENCOUNTER — Encounter: Payer: Self-pay | Admitting: Cardiovascular Disease

## 2013-10-05 VITALS — BP 118/64 | HR 50 | Ht 71.0 in | Wt 229.0 lb

## 2013-10-05 DIAGNOSIS — I4891 Unspecified atrial fibrillation: Secondary | ICD-10-CM

## 2013-10-05 DIAGNOSIS — I2510 Atherosclerotic heart disease of native coronary artery without angina pectoris: Secondary | ICD-10-CM

## 2013-10-05 NOTE — Progress Notes (Signed)
HPI:  72 year old gentleman presenting for followup evaluation. The patient is followed for coronary artery disease and atrial fibrillation. He presented with a myocardial infarction initially in 2008 and was treated with stenting of the right coronary artery. He followed up and was found to have atrial fibrillation in 2013. He had progressive LV dysfunction. Cardiac catheterization demonstrated progressive multivessel coronary artery disease. He was ultimately treated with two-vessel CABG and cryo- Cox-Maze surgery in February 2014. He presents today for followup evaluation. His last visit was in May 2014 and he was in sinus rhythm at that time. Xarelto was stopped and he was asked to maintain ASA indefinitely. He had an echocardiogram may 27th demonstrating improvement of LV function with an ejection fraction of 50-55%. Recent labs demonstrated a hemoglobin A1c of 6.7, creatinine of 1.0, and potassium 4.2. Lipids from November 2013 showed a cholesterol of 172, triglycerides 78, HDL 33, and LDL 123.  The patient is doing very well. He continues to exercise at the gym every day. He has no symptoms with exertion. He is very busy with his work. His weight has been stable at about 229 pounds. Before surgery his weight was over 250 pounds. His goal is 215-220 pounds. He denies dyspnea, edema, or palpitations. He's had no chest tightness with exertion.  Outpatient Encounter Prescriptions as of 10/05/2013  Medication Sig  . aspirin EC 81 MG EC tablet Take 1 tablet (81 mg total) by mouth daily.  . cetirizine (ZYRTEC) 10 MG tablet Take 10 mg by mouth daily.   Marland Kitchen ezetimibe (ZETIA) 10 MG tablet Take 10 mg by mouth daily.  Marland Kitchen glucose blood (CHOICE DM FORA G20 TEST STRIPS) test strip Use as instructed  . glucose monitoring kit (FREESTYLE) monitoring kit 1 each by Does not apply route as needed for other.  . Lancets (ONETOUCH ULTRASOFT) lancets Use as instructed  . losartan (COZAAR) 25 MG tablet Take 1 tablet (25  mg total) by mouth daily.  . meclizine (ANTIVERT) 25 MG tablet Take 1 tablet (25 mg total) by mouth 3 (three) times daily as needed.  . metFORMIN (GLUCOPHAGE) 500 MG tablet Take 250 mg by mouth daily with breakfast.   . metoprolol tartrate (LOPRESSOR) 25 MG tablet Take 1 tablet (25 mg total) by mouth 2 (two) times daily.  . sitaGLIPtin (JANUVIA) 100 MG tablet Take 100 mg by mouth daily.    Allergies  Allergen Reactions  . Asa Arthritis Strength-Antacid [Aspirin Buffered]     Bleeding tendencies   . Metformin And Related     headaches  . Statins     Aches     Past Medical History  Diagnosis Date  . Coronary artery disease   . Hyperlipidemia     takes Zetia daily  . Allergy   . Bronchitis   . Sleep apnea   . Cardiomyopathy, ischemic   . Obesity (BMI 30-39.9)   . 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 Complication of anesthesia     pt states he gets "crazy" after anesthesia  . Atrial fibrillation     persistent, failed DCCV;takes Metoprolol daily and taking Pacerone for 7days prior to surgery  . Hay fever     takes Zyrtec daily  . History of bronchitis     last time a year ago  . History of kidney stones   . Type II diabetes mellitus     takes Metformin and Januvia daily;pt states he is not a  diabetic but pre diabetic  . 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 S/P Maze operation for atrial fibrillation 01/27/2013    Complete bilateral lesion set using bipolar radiofrequency and cryothermy ablation with clipping of LA appendage  . Pleural effusion, left 02/21/2013    ROS: Negative except as per HPI  BP 118/64  Pulse 50  Ht 5\' 11"  (1.803 m)  Wt 229 lb (103.874 kg)  BMI 31.95 kg/m2  SpO2 96%  PHYSICAL EXAM: Pt is alert and oriented, NAD HEENT: normal Neck: JVP - normal, carotids 2+= without bruits Lungs: CTA bilaterally CV: RRR without murmur or gallop Abd: soft, NT, Positive BS, no  hepatomegaly Ext: no C/C/E, distal pulses intact and equal Skin: warm/dry no rash  Echo 04/26/2013: Left ventricle: The cavity size was normal. Wall thickness was increased in a pattern of mild LVH. Systolic function was normal. The estimated ejection fraction was in the range of 50% to 55%. Wall motion was normal; there were no regional wall motion abnormalities. Doppler parameters are consistent with high ventricular filling pressure.  ------------------------------------------------------------ Aortic valve: Trileaflet; mildly thickened leaflets. Cusp separation was normal. Doppler: Transvalvular velocity was within the normal range. There was no stenosis. No regurgitation.  ------------------------------------------------------------ Aorta: Aortic root: The aortic root was normal in size.  ------------------------------------------------------------ Mitral valve: Calcified annulus. Leaflet separation was normal. Doppler: Transvalvular velocity was within the normal range. There was no evidence for stenosis. Trivial regurgitation. Peak gradient: 4mm Hg (D).  ------------------------------------------------------------ Left atrium: The atrium was mildly dilated.  ------------------------------------------------------------ Right ventricle: The cavity size was normal. Systolic function was normal.  ------------------------------------------------------------ Pulmonic valve: Structurally normal valve. Cusp separation was normal. Doppler: Transvalvular velocity was within the normal range. Trivial regurgitation.  ------------------------------------------------------------ Tricuspid valve: Structurally normal valve. Leaflet separation was normal. Doppler: Transvalvular velocity was within the normal range. Mild regurgitation.  ------------------------------------------------------------ Pulmonary artery: Systolic pressure was mildly  increased.  ------------------------------------------------------------ Right atrium: The atrium was mildly dilated.  ------------------------------------------------------------ Pericardium: There was no pericardial effusion.  ------------------------------------------------------------ Systemic veins: Inferior vena cava: The vessel was normal in size; the respirophasic diameter changes were in the normal range (= 50%); findings are consistent with normal central venous pressure.  ASSESSMENT AND PLAN: 1. Coronary artery disease, native vessel. He remains on aspirin for antiplatelet therapy. He is statin intolerant and takes his ezetimibe 10 mg daily. We discussed lifestyle modification with the goal of increased exercise and dietary changes and weight loss.  2. Hypertension. Blood pressure is well controlled on losartan and metoprolol.  3. Atrial fibrillation. He is maintaining sinus rhythm after cryo-Cox maze surgery. He is on aspirin having discontinued anticoagulant therapy several months ago.  For followup I will see him back in 6 months.  [name] 10/05/2013 9:48 AM

## 2013-10-05 NOTE — Patient Instructions (Signed)
Your physician wants you to follow-up in: 6 MONTHS with Dr Cooper.  You will receive a reminder letter in the mail two months in advance. If you don't receive a letter, please call our office to schedule the follow-up appointment.  Your physician recommends that you continue on your current medications as directed. Please refer to the Current Medication list given to you today.  

## 2013-12-26 IMAGING — CR DG CHEST 2V
2 series · 2 of 2 positions shown · non-contrast
Comparison: 01/29/2013

CLINICAL DATA: CABG.

CHEST - 2 VIEW

[w chest pa]
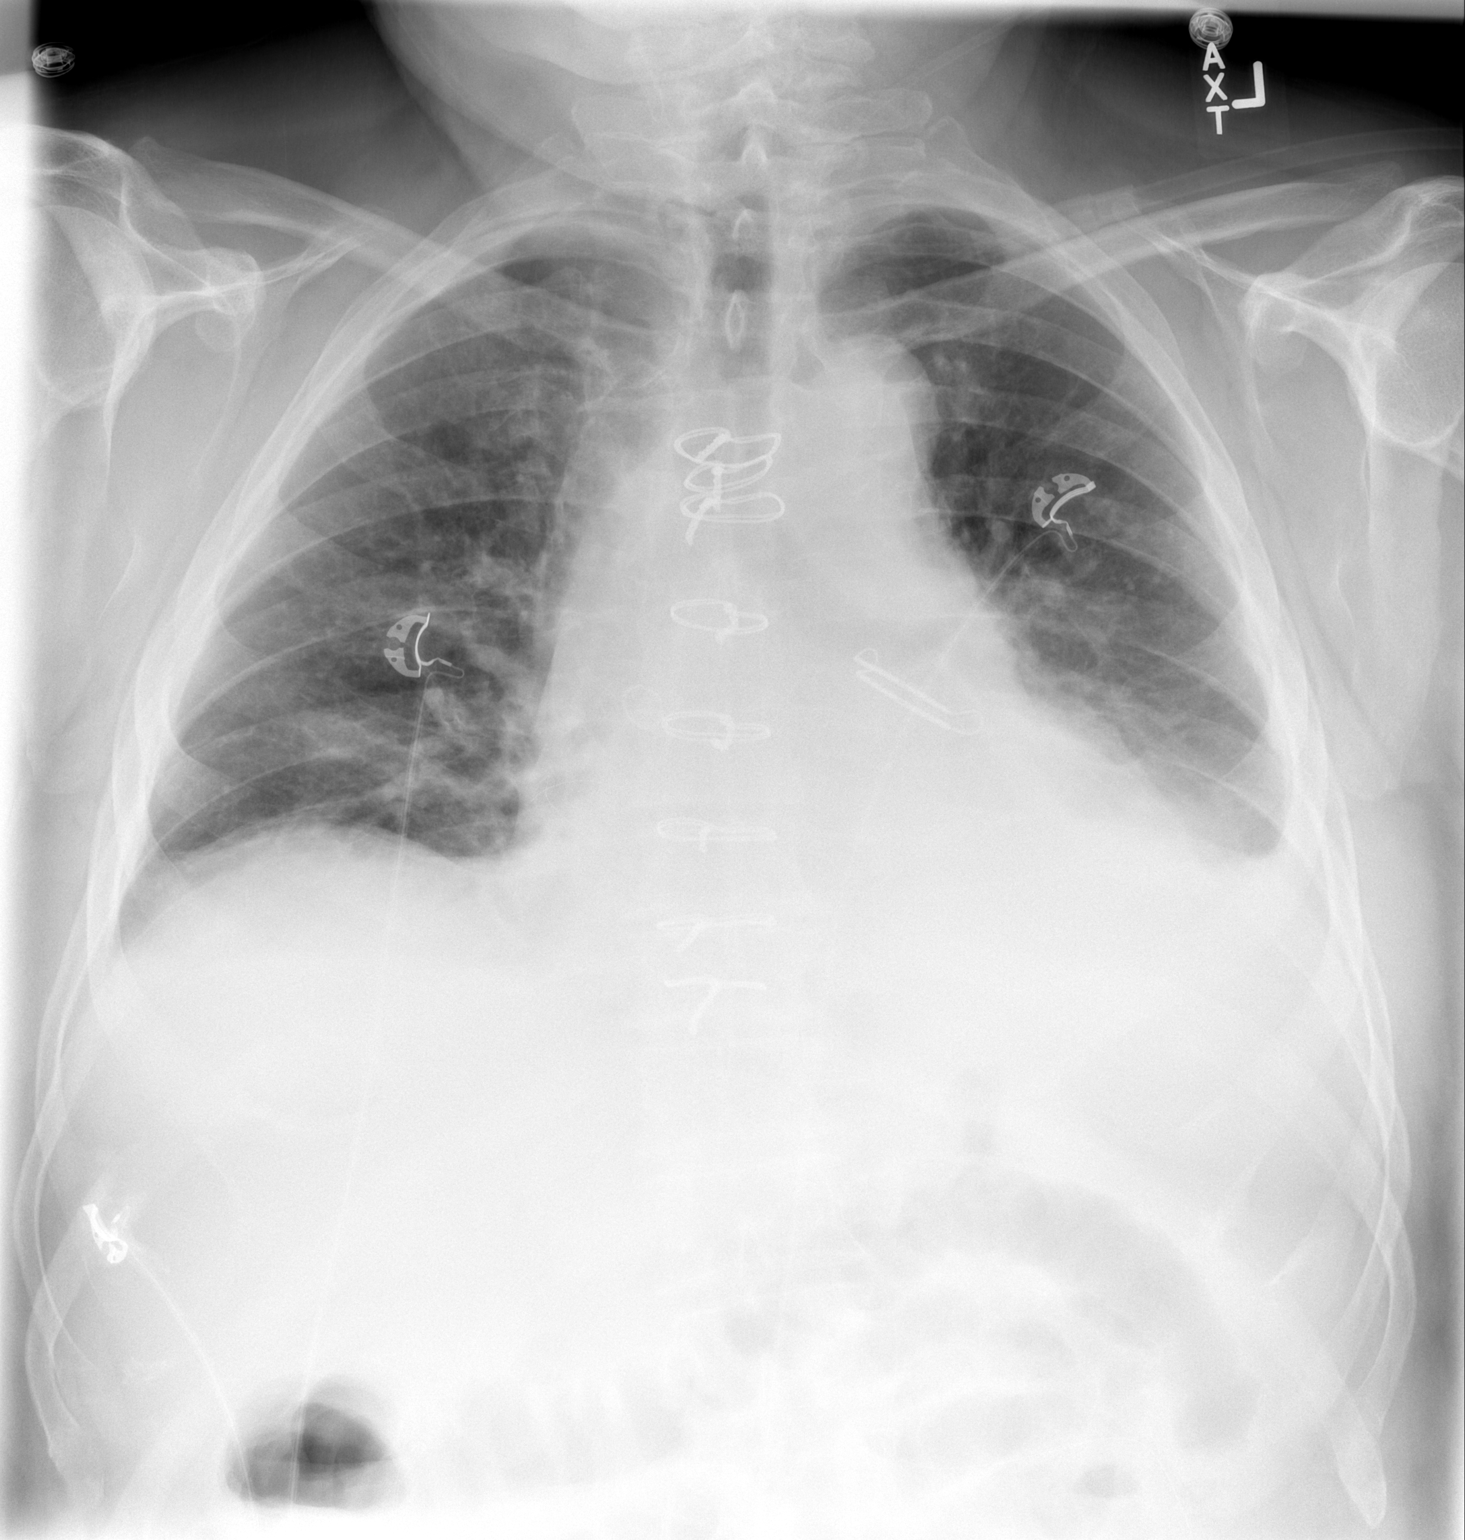

[w chest lat]
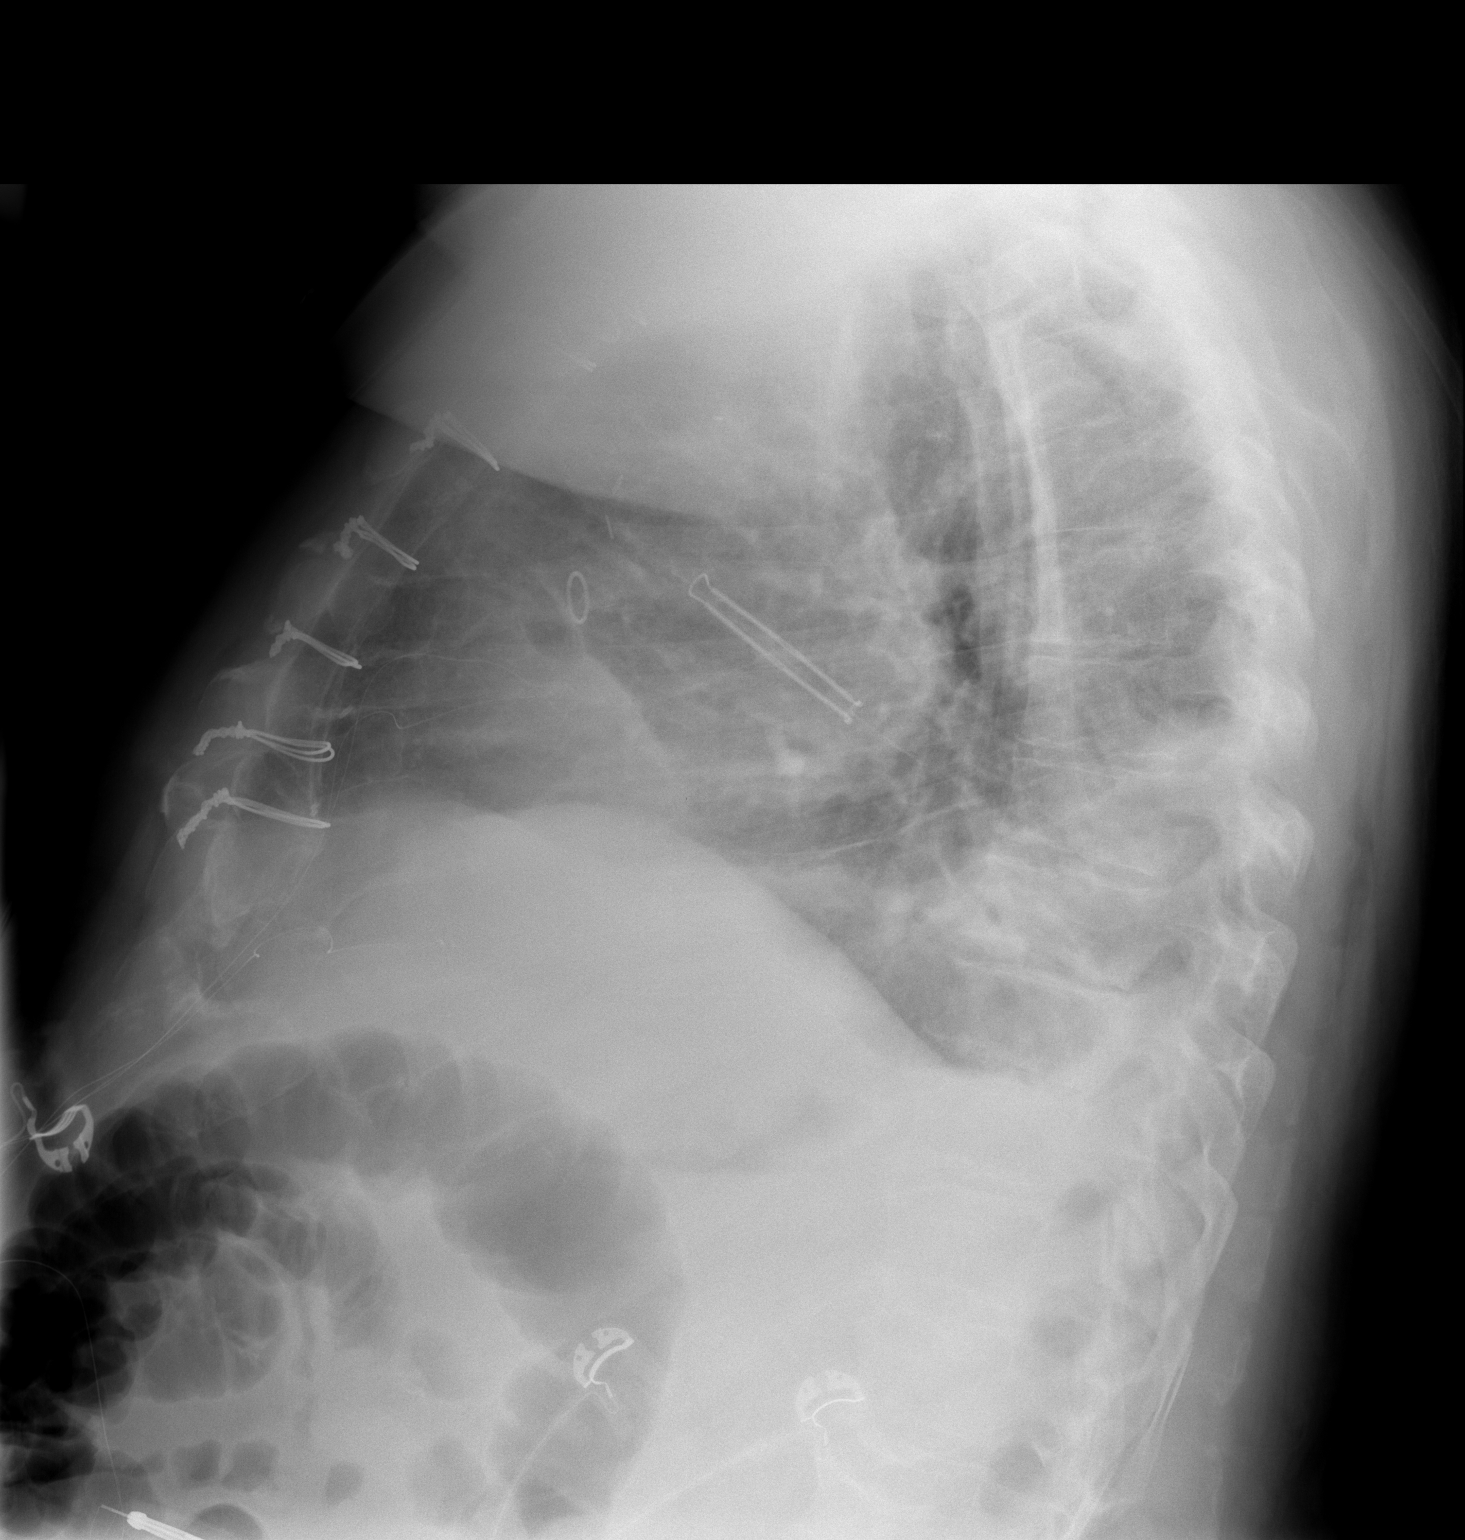

[2 of 2 positions shown; findings below may reference images not displayed]

FINDINGS: Prior CABG.  Cardiomegaly.  Left lower lobe atelectasis
with small left effusion.  Minimal right base atelectasis.  No
pneumothorax.  No acute bony abnormality.
IMPRESSION: Continued bibasilar opacities and small left effusion.  No real
change since prior study.

## 2013-12-30 IMAGING — CR DG CHEST 2V
2 series · 2 of 2 positions shown · non-contrast
Comparison: 01/30/2013.

CLINICAL DATA: Post open heart surgery.  Follow-up left effusion.

CHEST - 2 VIEW

[w chest pa]
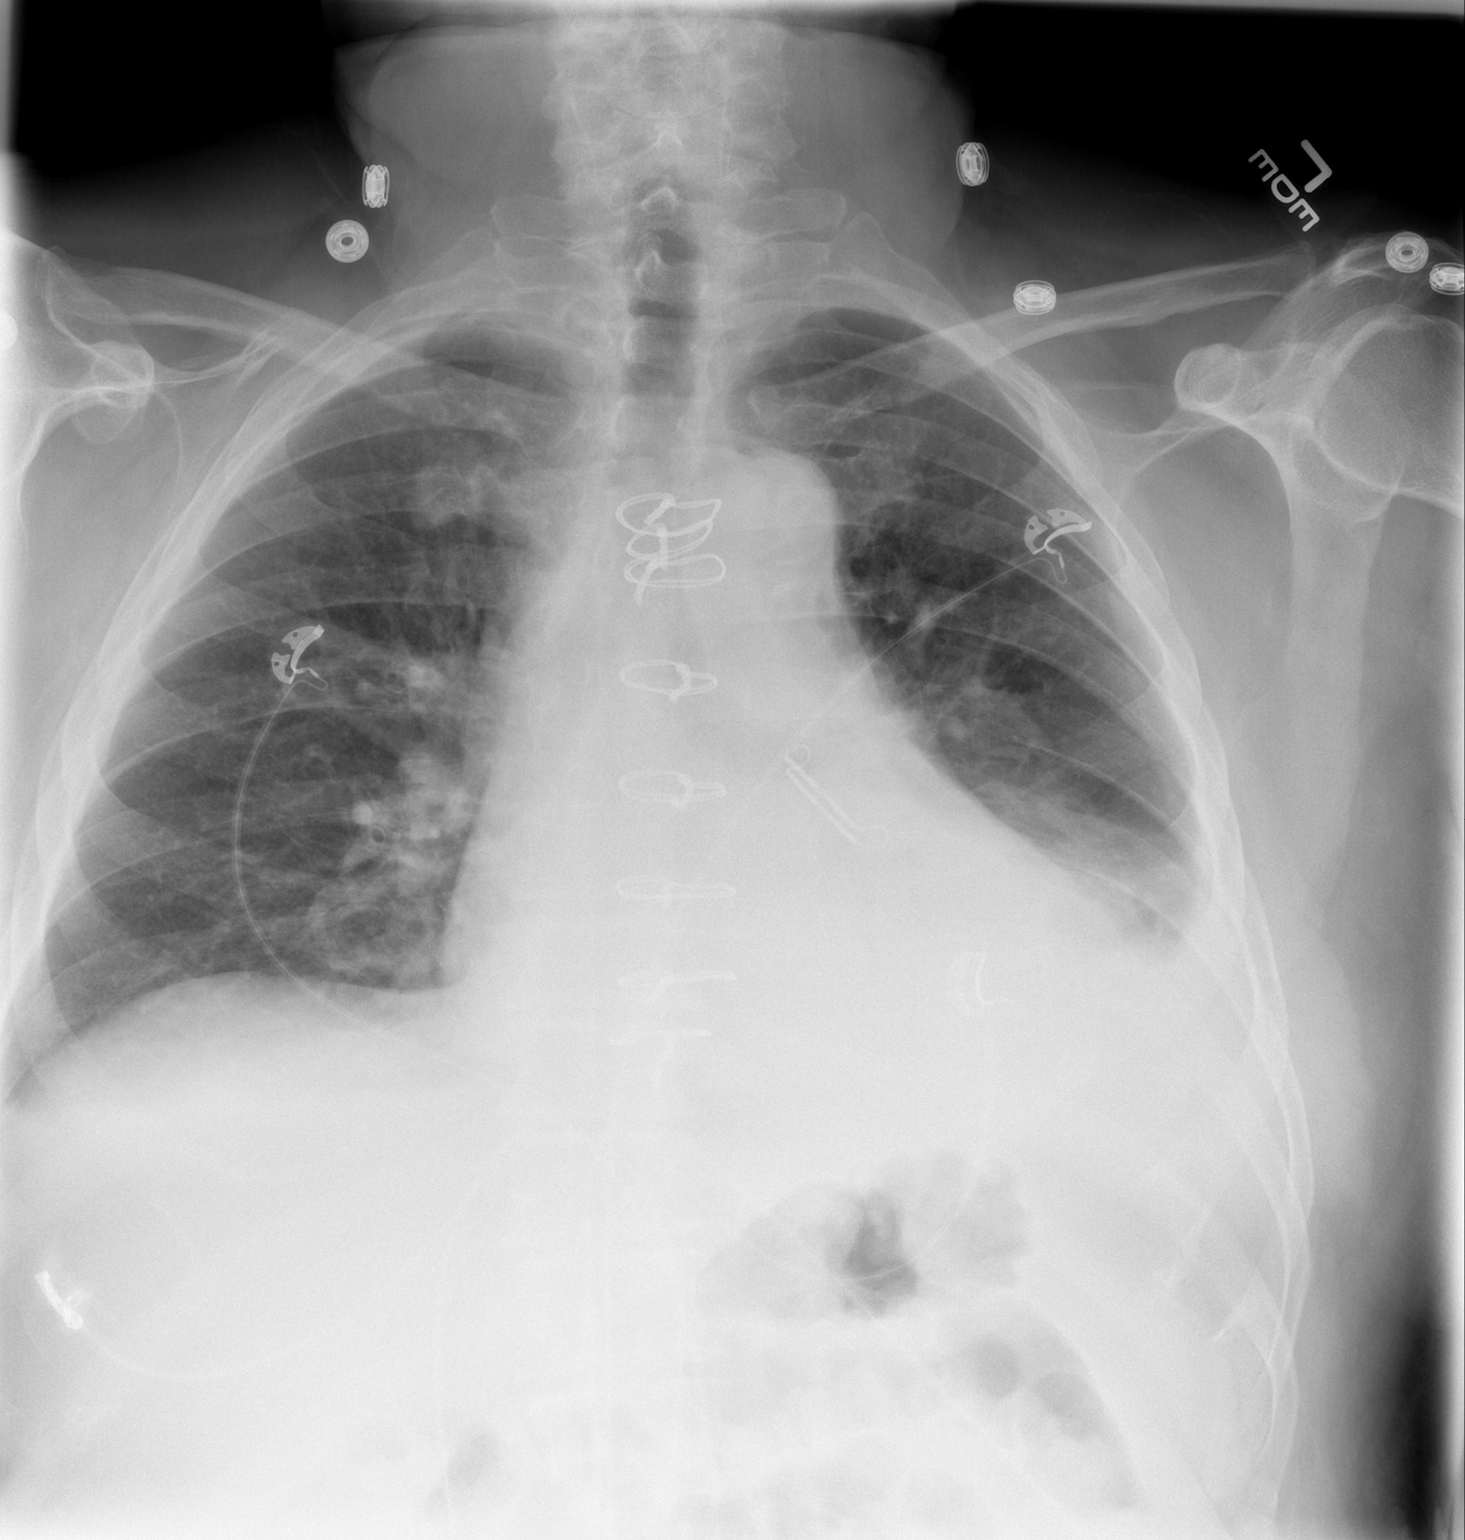

[w chest lat]
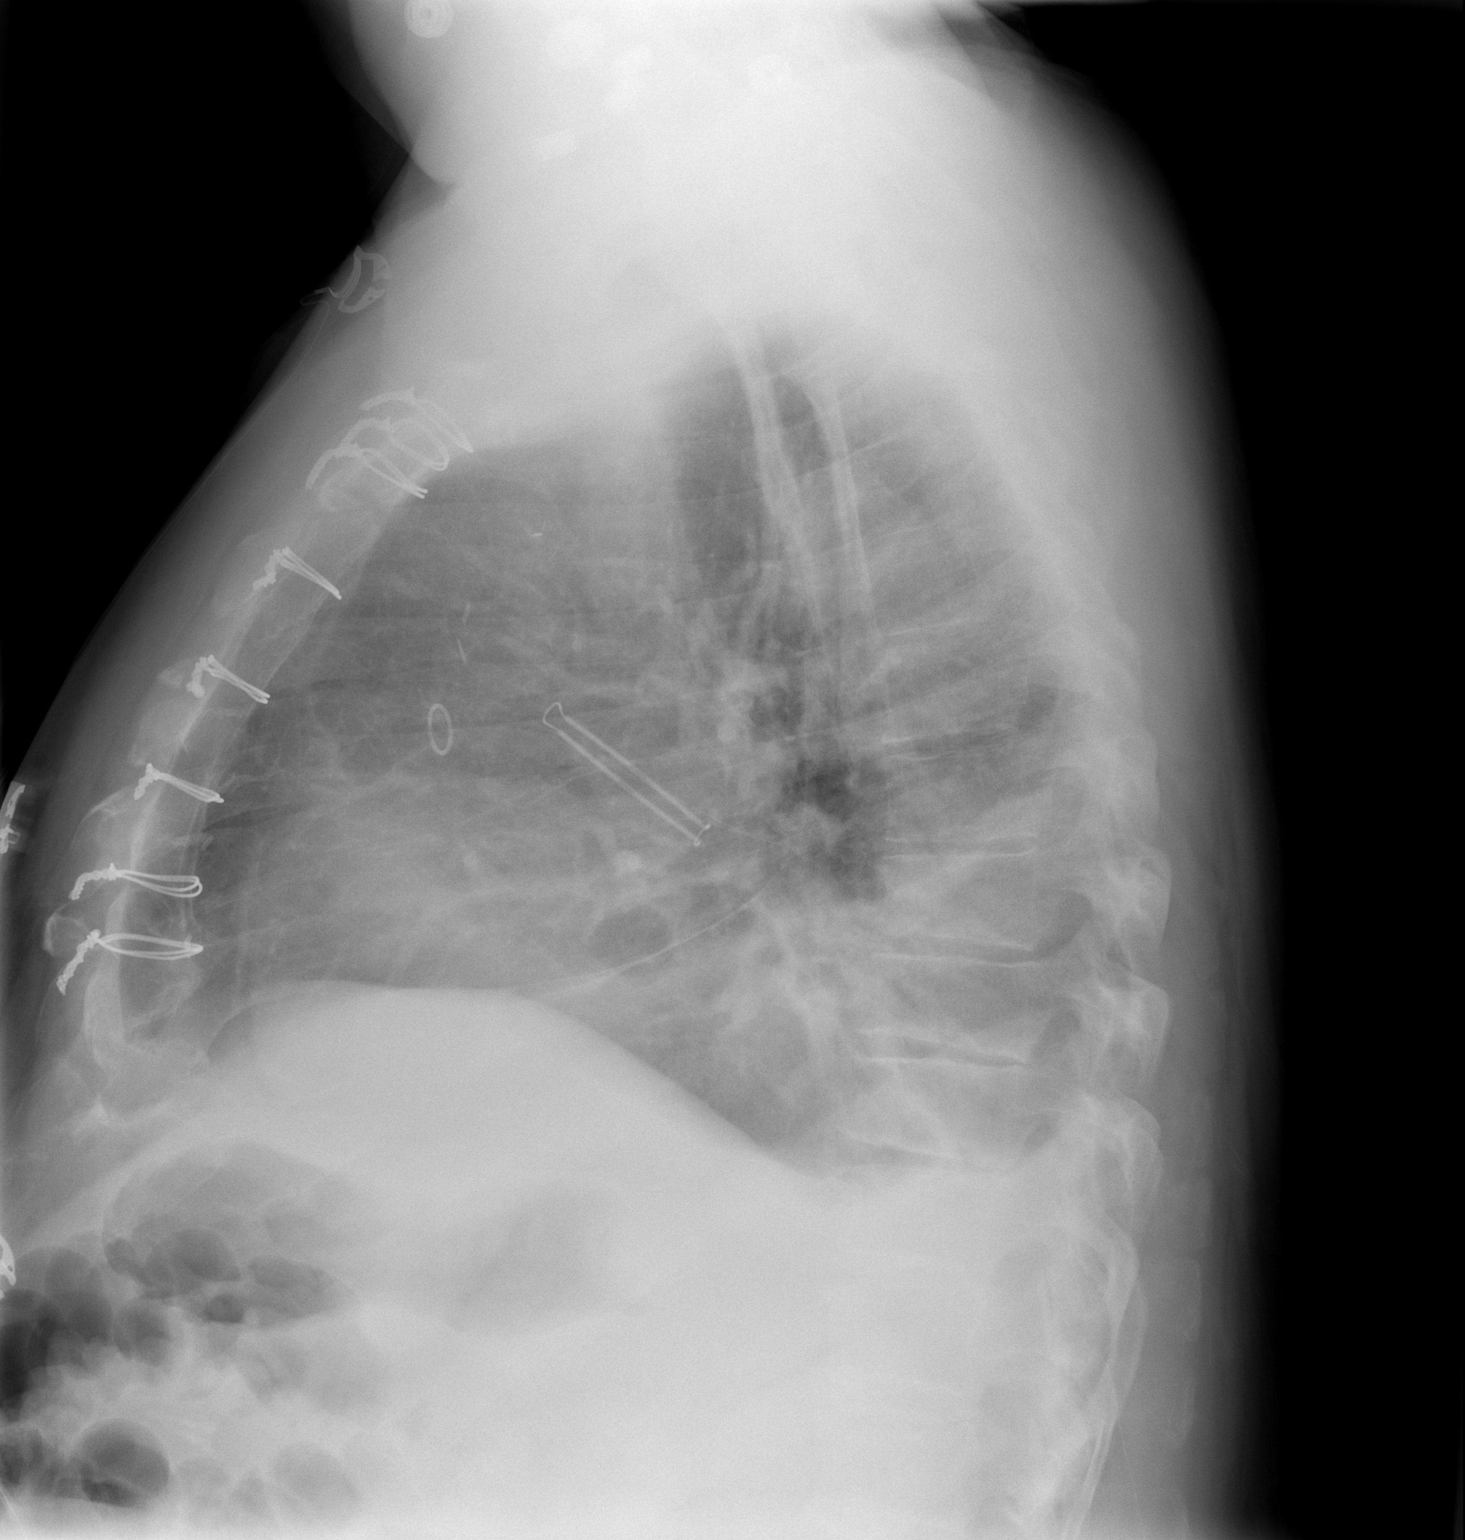

[2 of 2 positions shown; findings below may reference images not displayed]

FINDINGS: Trachea is midline.  Heart size stable.  Sternotomy wires
are unchanged in position.  Lungs are somewhat low in volume with
mild central pulmonary vascular congestion.  Moderate left pleural
effusion and left basilar airspace disease appear unchanged.
Minimal right lower lobe air space disease.  Tiny right pleural
effusion.
IMPRESSION: No interval change in bilateral pleural effusions and bibasilar air
space disease, left greater than right.

## 2014-01-23 ENCOUNTER — Encounter: Payer: Self-pay | Admitting: Thoracic Surgery (Cardiothoracic Vascular Surgery)

## 2014-01-23 ENCOUNTER — Ambulatory Visit (INDEPENDENT_AMBULATORY_CARE_PROVIDER_SITE_OTHER): Payer: Medicare Other | Admitting: Thoracic Surgery (Cardiothoracic Vascular Surgery)

## 2014-01-23 VITALS — BP 116/68 | HR 55 | Temp 97.2°F | Resp 20 | Ht 71.0 in | Wt 218.6 lb

## 2014-01-23 DIAGNOSIS — I2510 Atherosclerotic heart disease of native coronary artery without angina pectoris: Secondary | ICD-10-CM

## 2014-01-23 DIAGNOSIS — Z9889 Other specified postprocedural states: Secondary | ICD-10-CM

## 2014-01-23 DIAGNOSIS — Z8679 Personal history of other diseases of the circulatory system: Secondary | ICD-10-CM

## 2014-01-23 DIAGNOSIS — I4891 Unspecified atrial fibrillation: Secondary | ICD-10-CM

## 2014-01-23 DIAGNOSIS — Z951 Presence of aortocoronary bypass graft: Secondary | ICD-10-CM

## 2014-01-23 NOTE — Progress Notes (Signed)
[address]       [place] [address]             [phone]     CARDIOTHORACIC SURGERY OFFICE NOTE  Referring Provider is Sherren Mocha, MD PCP is Myriam Jacobson, MD   HPI:  Patient returns for followup status post coronary artery bypass grafting x2 and Maze procedure on 01/27/2013. He was last seen here in the office on 07/25/2013 and he was seen in followup by Dr. Burt Knack on 10/05/2013.  Since then he has continued to do very well. He goes to the gym on a daily basis and exercises vigorously. He is back at work and reports no physical limitations whatsoever. He states that in retrospect he now feels much better than he has in several years. His exercise tolerance is quite good and he has no exertional chest discomfort or shortness of breath. He has not had any tachypalpitations. He has been treated for bronchitis recently with antibiotics, but the cough is resolving.    Current Outpatient Prescriptions  Medication Sig Dispense Refill  . aspirin EC 81 MG EC tablet Take 1 tablet (81 mg total) by mouth daily.      . cetirizine (ZYRTEC) 10 MG tablet Take 10 mg by mouth daily.       Marland Kitchen ezetimibe (ZETIA) 10 MG tablet Take 10 mg by mouth daily.      Marland Kitchen glucose blood (CHOICE DM FORA G20 TEST STRIPS) test strip Use as instructed  100 each  12  . glucose monitoring kit (FREESTYLE) monitoring kit 1 each by Does not apply route as needed for other.  1 each  0  . Lancets (ONETOUCH ULTRASOFT) lancets Use as instructed  100 each  12  . losartan (COZAAR) 25 MG tablet Take 1 tablet (25 mg total) by mouth daily.  90 tablet  3  . meclizine (ANTIVERT) 25 MG tablet Take 1 tablet (25 mg total) by mouth 3 (three) times daily as needed.  30 tablet  0  . metFORMIN (GLUCOPHAGE) 500 MG tablet Take 250 mg by mouth daily with breakfast.       . metoprolol tartrate (LOPRESSOR) 25 MG tablet Take 1 tablet (25 mg total) by mouth 2 (two) times daily.  180 tablet  3  . sitaGLIPtin  (JANUVIA) 100 MG tablet Take 100 mg by mouth daily.       No current facility-administered medications for this visit.      Physical Exam:   BP 116/68  Pulse 55  Temp(Src) 97.2 F (36.2 C) (Oral)  Resp 20  Ht $R'5\' 11"'Kg$  (1.803 m)  Wt 218 lb 9.6 oz (99.156 kg)  BMI 30.50 kg/m2  SpO2 98%  General:  Well-appearing  Chest:   Clear to auscultation  CV:   Regular rate and rhythm  Incisions:  Completely healed, sternum is stable  Abdomen:  Soft and nontender  Extremities:  Warm and well-perfused  Diagnostic Tests:  2 channel telemetry rhythm strip demonstrates normal sinus rhythm   Impression:  Patient is doing well approximately one year status post coronary artery bypass grafting x2 with Maze procedure. He is maintaining sinus rhythm.  Plan:  The patient will return for routine followup and rhythm check in one year.   Valentina Gu. Roxy Manns, MD 01/23/2014 1:07 PM

## 2014-02-21 ENCOUNTER — Encounter: Payer: Self-pay | Admitting: Cardiovascular Disease

## 2014-02-21 ENCOUNTER — Ambulatory Visit (INDEPENDENT_AMBULATORY_CARE_PROVIDER_SITE_OTHER): Payer: Medicare Other | Admitting: Cardiovascular Disease

## 2014-02-21 VITALS — BP 124/66 | HR 75 | Ht 71.0 in | Wt 223.0 lb

## 2014-02-21 DIAGNOSIS — I255 Ischemic cardiomyopathy: Secondary | ICD-10-CM

## 2014-02-21 DIAGNOSIS — I2510 Atherosclerotic heart disease of native coronary artery without angina pectoris: Secondary | ICD-10-CM

## 2014-02-21 DIAGNOSIS — E785 Hyperlipidemia, unspecified: Secondary | ICD-10-CM

## 2014-02-21 DIAGNOSIS — I2589 Other forms of chronic ischemic heart disease: Secondary | ICD-10-CM

## 2014-02-21 DIAGNOSIS — I4891 Unspecified atrial fibrillation: Secondary | ICD-10-CM

## 2014-02-21 DIAGNOSIS — I10 Essential (primary) hypertension: Secondary | ICD-10-CM

## 2014-02-21 IMAGING — CR DG CHEST 2V
2 series · 2 of 2 positions shown · non-contrast
Comparison: 02/22/2013.

CLINICAL DATA: Recent heart surgery, follow-up.

CHEST - 2 VIEW

[w chest pa]
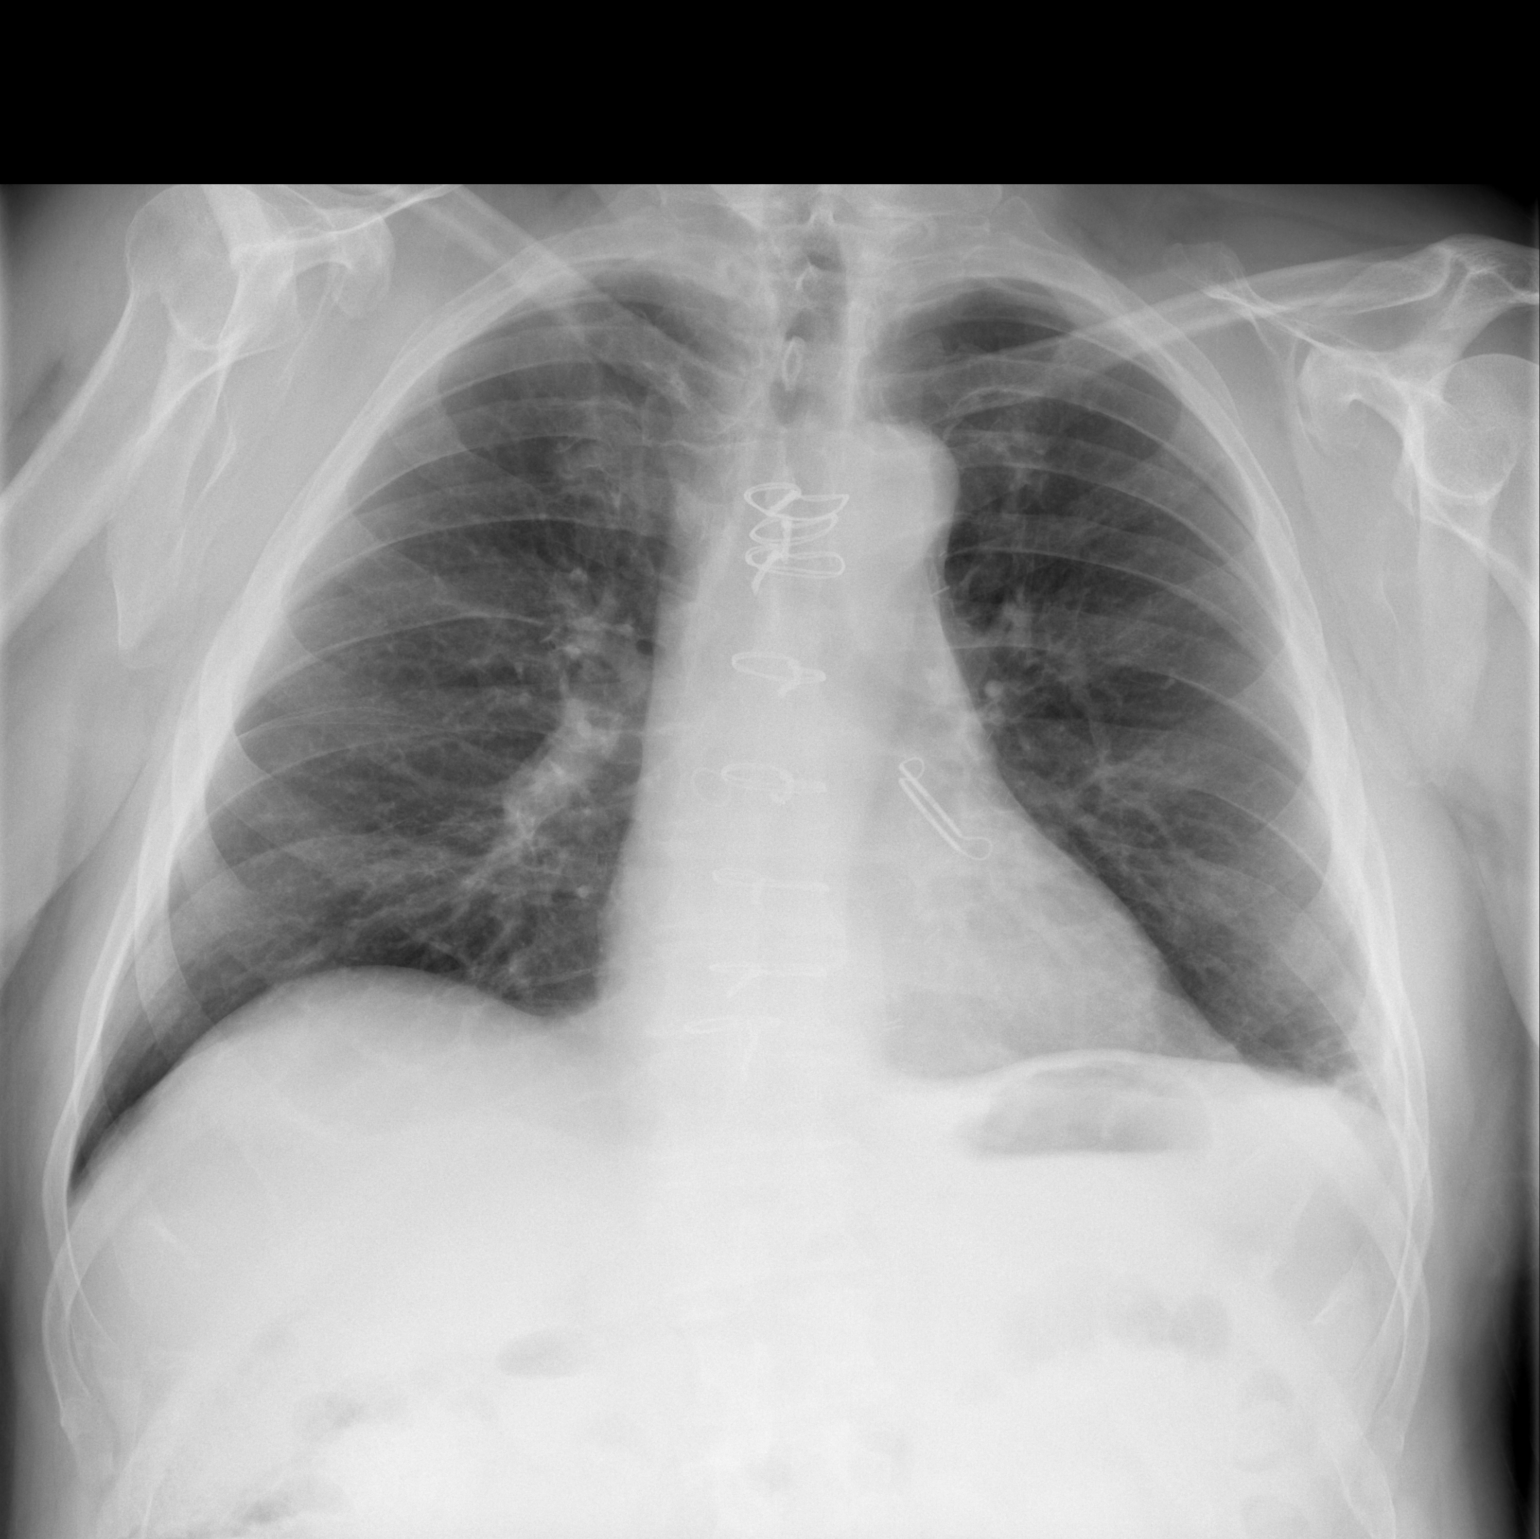

[w chest lat]
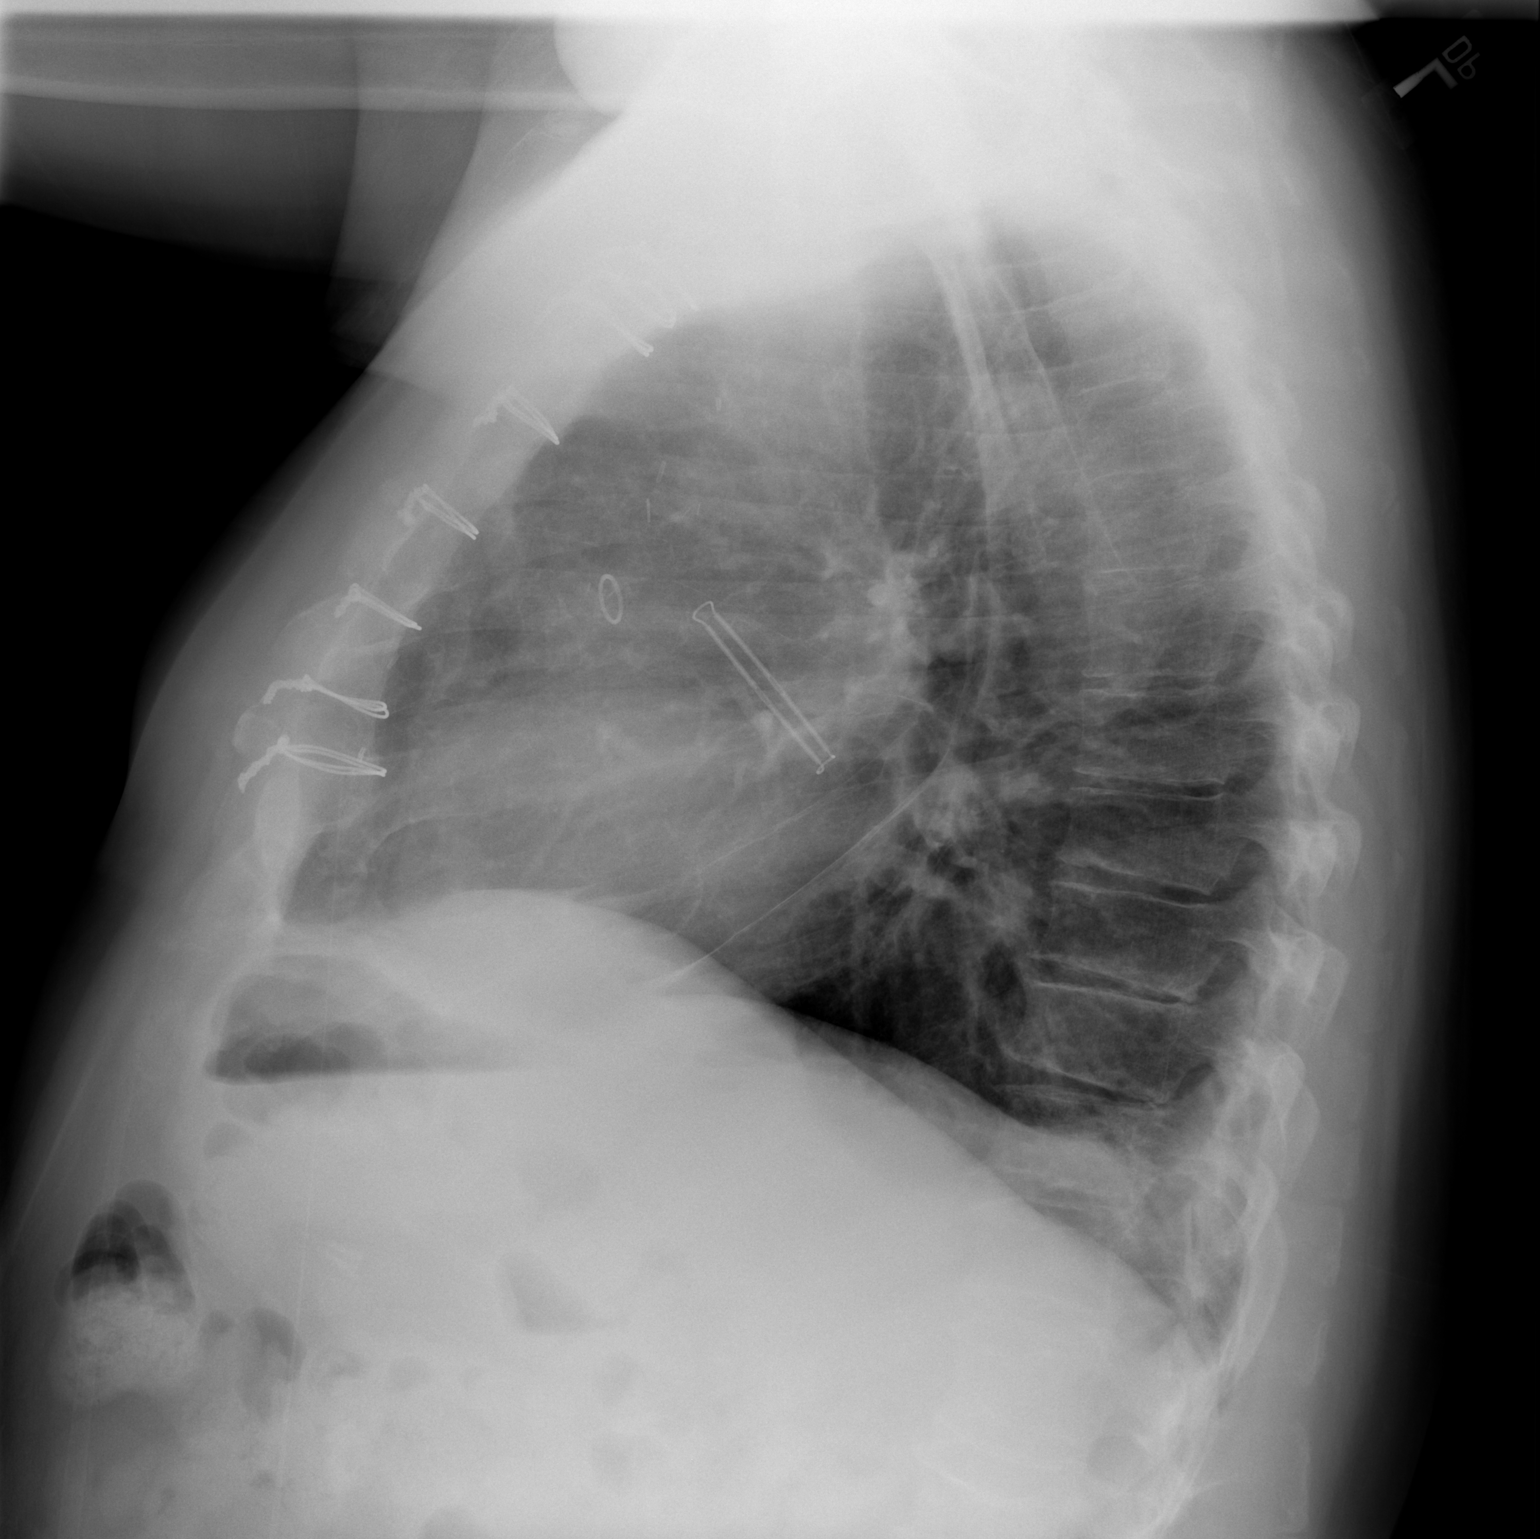

[2 of 2 positions shown; findings below may reference images not displayed]

FINDINGS: Trachea is midline.  Heart size normal.  Lungs are
somewhat low in volume with minimal residual linear atelectasis or
scarring and tiny left pleural effusion or pleural thickening at
the left costophrenic angle. Lungs are otherwise clear.
IMPRESSION: Minimal residual atelectasis/scar and tiny pleural fluid/thickening
at the left costophrenic angle.

## 2014-02-21 NOTE — Patient Instructions (Signed)
Your physician wants you to follow-up in: 6 months with Dr. Cooper.  You will receive a reminder letter in the mail two months in advance. If you don't receive a letter, please call our office to schedule the follow-up appointment.  Your physician recommends that you continue on your current medications as directed. Please refer to the Current Medication list given to you today.  

## 2014-02-25 ENCOUNTER — Encounter: Payer: Self-pay | Admitting: Cardiovascular Disease

## 2014-02-25 NOTE — Progress Notes (Signed)
HPI:  73 year-old male presenting for follow-up of CAD and atrial fibrillation. He presented with a myocardial infarction initially in 2008 and was treated with stenting of the right coronary artery. He followed up and was found to have atrial fibrillation in 2013. He had progressive LV dysfunction. Cardiac catheterization demonstrated progressive multivessel coronary artery disease. He was ultimately treated with two-vessel CABG and cryo- Cox-Maze surgery in February 2014. He presents today for followup evaluation. He's maintained sinus rhythm ever since cardiac surgery.  The patient called in with fatigue and left chest pain. He describes a focal area of discomfort in his left chest, unrelated to exertion. Sx's worse with deep inspiration. He's had bronchitis but is recovery from this. Denies dyspnea or edema, but complains of generalized fatigue. No other complaints.  Outpatient Encounter Prescriptions as of 02/21/2014  Medication Sig  . aspirin EC 81 MG EC tablet Take 1 tablet (81 mg total) by mouth daily.  . cetirizine (ZYRTEC) 10 MG tablet Take 10 mg by mouth daily.   Marland Kitchen glucose blood (CHOICE DM FORA G20 TEST STRIPS) test strip Use as instructed  . glucose monitoring kit (FREESTYLE) monitoring kit 1 each by Does not apply route as needed for other.  . Lancets (ONETOUCH ULTRASOFT) lancets Use as instructed  . losartan (COZAAR) 25 MG tablet Take 1 tablet (25 mg total) by mouth daily.  . meclizine (ANTIVERT) 25 MG tablet Take 1 tablet (25 mg total) by mouth 3 (three) times daily as needed.  . metFORMIN (GLUCOPHAGE) 500 MG tablet Take 250 mg by mouth daily with breakfast.   . metoprolol tartrate (LOPRESSOR) 25 MG tablet Take 1 tablet (25 mg total) by mouth 2 (two) times daily.  . [DISCONTINUED] ezetimibe (ZETIA) 10 MG tablet Take 10 mg by mouth daily.  . [DISCONTINUED] sitaGLIPtin (JANUVIA) 100 MG tablet Take 100 mg by mouth daily.    Allergies  Allergen Reactions  . Asa Arthritis  Strength-Antacid [Aspirin Buffered]     Bleeding tendencies   . Metformin And Related     headaches  . Statins     Aches     Past Medical History  Diagnosis Date  . Coronary artery disease   . Hyperlipidemia     takes Zetia daily  . Allergy   . Bronchitis   . Sleep apnea   . Cardiomyopathy, ischemic   . Obesity (BMI 30-39.9)   . 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 Complication of anesthesia     pt states he gets "crazy" after anesthesia  . Atrial fibrillation     persistent, failed DCCV;takes Metoprolol daily and taking Pacerone for 7days prior to surgery  . Hay fever     takes Zyrtec daily  . History of bronchitis     last time a year ago  . History of kidney stones   . Type II diabetes mellitus     takes Metformin and Januvia daily;pt states he is not a diabetic but pre diabetic  . 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 S/P Maze operation for atrial fibrillation 01/27/2013    Complete bilateral lesion set using bipolar radiofrequency and cryothermy ablation with clipping of LA appendage  . Pleural effusion, left 02/21/2013    ROS: Negative except as per HPI  BP 124/66  Pulse 75  Ht '5\' 11"'  (1.803 m)  Wt 223 lb (101.152 kg)  BMI 31.12 kg/m2  PHYSICAL EXAM: Pt is alert and oriented, NAD HEENT: normal Neck: JVP - normal, carotids 2+= without bruits Lungs: CTA bilaterally Chest: no chest wall tenderness CV: RRR without murmur or gallop Abd: soft, NT, Positive BS, no hepatomegaly Ext: no C/C/E, distal pulses intact and equal Skin: warm/dry no rash  EKG:  NSR 75 bpm, age-indeterminate inferior MI  ASSESSMENT AND PLAN: 1. Chest pain - does not appear to be cardiac. Suspect either costrochondritis or pleurisy following acute bronchitis. Pt reassured and advised he could consider short course of NSAID's or acetaminophen.   2. CAD s/p CABG - stable without exertional angina. Continue current  Rx.  3. Atrial fib - no recurrence since Maze procedure. On ASA.  4. HTN - well-controlled  I will see him back in 6 months.  Sherren Mocha 02/25/2014 9:38 PM

## 2014-03-14 NOTE — Telephone Encounter (Signed)
This encounter was created in error - please disregard.

## 2014-04-26 ENCOUNTER — Ambulatory Visit: Payer: Medicare Other | Admitting: Cardiovascular Disease

## 2014-08-23 ENCOUNTER — Ambulatory Visit (INDEPENDENT_AMBULATORY_CARE_PROVIDER_SITE_OTHER): Payer: Medicare Other | Admitting: Cardiovascular Disease

## 2014-08-23 ENCOUNTER — Encounter: Payer: Self-pay | Admitting: Cardiovascular Disease

## 2014-08-23 VITALS — BP 118/80 | HR 69 | Ht 71.0 in | Wt 236.4 lb

## 2014-08-23 DIAGNOSIS — I2510 Atherosclerotic heart disease of native coronary artery without angina pectoris: Secondary | ICD-10-CM

## 2014-08-23 DIAGNOSIS — I4891 Unspecified atrial fibrillation: Secondary | ICD-10-CM

## 2014-08-23 MED ORDER — PREDNISONE 20 MG PO TABS: ORAL_TABLET | ORAL | Status: DC

## 2014-08-23 MED ORDER — FAMOTIDINE 20 MG PO TABS: ORAL_TABLET | ORAL | Status: DC

## 2014-08-23 NOTE — Patient Instructions (Signed)
Your physician recommends that you continue on your current medications as directed. Please refer to the Current Medication list given to you today.  Your physician wants you to follow-up in: 6 months with Dr. Cooper.  You will receive a reminder letter in the mail two months in advance. If you don't receive a letter, please call our office to schedule the follow-up appointment.  

## 2014-08-24 ENCOUNTER — Other Ambulatory Visit: Payer: Self-pay

## 2014-08-24 NOTE — Telephone Encounter (Signed)
pt [facility] adv to disregard escribe for pepcid and prednisone.

## 2014-08-25 ENCOUNTER — Encounter: Payer: Self-pay | Admitting: Cardiovascular Disease

## 2014-08-25 NOTE — Progress Notes (Signed)
HPI:  73 year-old male presenting for follow-up of CAD and atrial fibrillation. He presented with a myocardial infarction initially in 2008 and was treated with stenting of the right coronary artery. He followed up and was found to have atrial fibrillation in 2013. He had progressive LV dysfunction. Cardiac catheterization demonstrated progressive multivessel coronary artery disease. He was ultimately treated with two-vessel CABG and cryo- Cox-Maze surgery in February 2014. He presents today for followup evaluation.  [name] continues to do well. He really has no cardiac related complaints. He specifically denies chest pain, shortness of breath, leg swelling, or heart palpitations. His energy level is good. He's been busy at work and has fallen off on his exercise program a bit.   Outpatient Encounter Prescriptions as of 08/23/2014  Medication Sig  . aspirin EC 81 MG EC tablet Take 1 tablet (81 mg total) by mouth daily.  . cetirizine (ZYRTEC) 10 MG tablet Take 10 mg by mouth daily.   Marland Kitchen glucose blood (CHOICE DM FORA G20 TEST STRIPS) test strip Use as instructed  . glucose monitoring kit (FREESTYLE) monitoring kit 1 each by Does not apply route as needed for other.  . Lancets (ONETOUCH ULTRASOFT) lancets Use as instructed  . losartan (COZAAR) 25 MG tablet Take 1 tablet (25 mg total) by mouth daily.  . metoprolol tartrate (LOPRESSOR) 25 MG tablet Take 25 mg by mouth daily.  . [DISCONTINUED] metoprolol tartrate (LOPRESSOR) 25 MG tablet Take 1 tablet (25 mg total) by mouth 2 (two) times daily.  . metFORMIN (GLUCOPHAGE) 500 MG tablet Take 250 mg by mouth daily with breakfast.   . [DISCONTINUED] famotidine (PEPCID) 20 MG tablet Take 1 tablet the night before procedure. Take 1 tablet the morning of procedure  . [DISCONTINUED] meclizine (ANTIVERT) 25 MG tablet Take 1 tablet (25 mg total) by mouth 3 (three) times daily as needed.  . [DISCONTINUED] predniSONE (DELTASONE) 20 MG tablet Take 1 tablet 18hours  prior to procedure, Take 1 tablet the morning of procedure    Allergies  Allergen Reactions  . Asa Arthritis Strength-Antacid [Aspirin Buffered]     Bleeding tendencies   . Metformin And Related     headaches  . Statins     Aches     Past Medical History  Diagnosis Date  . Coronary artery disease   . Hyperlipidemia     takes Zetia daily  . Allergy   . Bronchitis   . Sleep apnea   . Cardiomyopathy, ischemic   . Obesity (BMI 30-39.9)   . 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 Complication of anesthesia     pt states he gets "crazy" after anesthesia  . Atrial fibrillation     persistent, failed DCCV;takes Metoprolol daily and taking Pacerone for 7days prior to surgery  . Hay fever     takes Zyrtec daily  . History of bronchitis     last time a year ago  . History of kidney stones   . Type II diabetes mellitus     takes Metformin and Januvia daily;pt states he is not a diabetic but pre diabetic  . 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 S/P Maze operation for atrial fibrillation 01/27/2013    Complete bilateral lesion set using bipolar radiofrequency and cryothermy ablation with clipping of LA appendage  . Pleural effusion, left 02/21/2013    ROS: Negative except  as per HPI  BP 118/80  Pulse 69  Ht '5\' 11"'  (1.803 m)  Wt 236 lb 6.4 oz (107.23 kg)  BMI 32.99 kg/m2  PHYSICAL EXAM: Pt is alert and oriented, NAD HEENT: normal Neck: JVP - normal, carotids 2+= without bruits Lungs: CTA bilaterally CV: RRR without murmur or gallop Abd: soft, NT, Positive BS, no hepatomegaly Ext: no C/C/E, distal pulses intact and equal Skin: warm/dry no rash  EKG:  Normal sinus rhythm 69 beats per minute, age indeterminate inferior infarct.  ASSESSMENT AND PLAN: 1. Coronary artery disease status post CABG. The patient is stable without symptoms of angina. He will continue on aspirin and metoprolol. I will see him  back in 6 months.  2. Atrial fibrillation. The patient is without recurrence following cryo- Cox-Maze procedure.  3. Hypertension. Blood pressure is well controlled on losartan and metoprolol.  4. Type 2 diabetes. Patient takes metformin. He is treated by Dr. Mancel Bale.  5. Hyperlipidemia. He has been statin intolerant. Lipids also followed by Dr. Mancel Bale. We discussed consideration of a PCSK9 inhibitor. There are currently unanswered questions about insurance coverage. Will readdress at his 6 month follow-up as coverage questions should be answered by that time. I think he would be a good candidate for secondary prevention considering his hx of MI/CABG/diabetes with statin intolerance and need for aggressive risk reduction.  [name] 08/25/2014 6:06 AM

## 2014-11-17 ENCOUNTER — Telehealth: Payer: Self-pay | Admitting: *Deleted

## 2014-11-17 NOTE — Telephone Encounter (Signed)
Received an rx request for patients metoprolol, but it states that this was originally prescribed by pcp and I do not see where you have ever refilled it for the patient. Please advise. Thanks, MI

## 2014-11-20 NOTE — Telephone Encounter (Signed)
Okay to refill? 

## 2014-11-21 ENCOUNTER — Other Ambulatory Visit: Payer: Self-pay | Admitting: *Deleted

## 2014-11-21 MED ORDER — METOPROLOL TARTRATE 25 MG PO TABS: 25.0000 mg | ORAL_TABLET | Freq: Every day | ORAL | Status: DC

## 2014-11-23 ENCOUNTER — Telehealth: Payer: Self-pay | Admitting: Cardiovascular Disease

## 2014-11-23 NOTE — Telephone Encounter (Signed)
Wrong phone no. Given . [place] a private residence.

## 2014-11-23 NOTE — Telephone Encounter (Signed)
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  Pt says he is not a diabetic.

## 2014-11-27 ENCOUNTER — Telehealth: Payer: Self-pay | Admitting: Cardiovascular Disease

## 2014-11-27 NOTE — Telephone Encounter (Signed)
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  Advised pt that right now the first availability in our office is mid February for regular GXT.  He states he didn't want to go to the "doc in the box" but he guessed he will have to.

## 2014-11-27 NOTE — Telephone Encounter (Signed)
New problem   Pt stated he needed an stress test for DOT physical by Jan 3rd or 4th. Please advise pt.

## 2014-11-28 ENCOUNTER — Other Ambulatory Visit: Payer: Self-pay

## 2014-11-28 DIAGNOSIS — I25810 Atherosclerosis of coronary artery bypass graft(s) without angina pectoris: Secondary | ICD-10-CM

## 2014-12-05 ENCOUNTER — Ambulatory Visit (INDEPENDENT_AMBULATORY_CARE_PROVIDER_SITE_OTHER): Payer: Medicare Other | Admitting: Cardiovascular Disease

## 2014-12-05 ENCOUNTER — Encounter: Payer: Self-pay | Admitting: Cardiovascular Disease

## 2014-12-05 DIAGNOSIS — I25810 Atherosclerosis of coronary artery bypass graft(s) without angina pectoris: Secondary | ICD-10-CM

## 2014-12-05 NOTE — Progress Notes (Signed)
Exercise Treadmill Test  Pre-Exercise Testing Evaluation Rhythm: normal sinus  Rate: 67 bpm     Test  Exercise Tolerance Test Ordering MD: Sherren Mocha, MD  Interpreting MD: Sherren Mocha, MD  Unique Test No: 1  Treadmill:  1  Indication for ETT: known ASHD  Contraindication to ETT: No   Stress Modality: exercise - treadmill  Cardiac Imaging Performed: non   Protocol: standard Bruce - maximal  Max BP:  204/83  Max MPHR (bpm):  149 85% MPR (bpm):  127  MPHR obtained (bpm):  130 % MPHR obtained:  87  Reached 85% MPHR (min:sec):  8:40 Total Exercise Time (min-sec):  9:00  Workload in METS:  10.1 Borg Scale: 13  Reason ETT Terminated:  dyspnea    ST Segment Analysis At Rest: normal ST segments - no evidence of significant ST depression With Exercise: no evidence of significant ST depression  Other Information Arrhythmia:  No Angina during ETT:  absent (0) Quality of ETT:  diagnostic  ETT Interpretation:  normal - no evidence of ischemia by ST analysis  Comments: Good exercise tolerance. No angina, arrhythmia, or significant ST depression with exercise. This is a negative (adequate) stress test.  Recommendations: Continue current medical therapy.

## 2015-02-12 ENCOUNTER — Encounter: Payer: Self-pay | Admitting: Thoracic Surgery (Cardiothoracic Vascular Surgery)

## 2015-02-12 ENCOUNTER — Ambulatory Visit (INDEPENDENT_AMBULATORY_CARE_PROVIDER_SITE_OTHER): Payer: Medicare Other | Admitting: Thoracic Surgery (Cardiothoracic Vascular Surgery)

## 2015-02-12 VITALS — BP 128/73 | HR 73 | Resp 20 | Ht 71.0 in | Wt 236.0 lb

## 2015-02-12 DIAGNOSIS — Z9889 Other specified postprocedural states: Secondary | ICD-10-CM

## 2015-02-12 DIAGNOSIS — Z8679 Personal history of other diseases of the circulatory system: Secondary | ICD-10-CM

## 2015-02-12 DIAGNOSIS — Z951 Presence of aortocoronary bypass graft: Secondary | ICD-10-CM

## 2015-02-12 NOTE — Patient Instructions (Signed)
The patient is reminded to make every effort to keep their diabetes under very tight control.  They should follow up closely with their primary care physician or endocrinologist and strive to keep their hemoglobin A1c levels as low as possible, preferably near or below 6.0

## 2015-02-12 NOTE — Progress Notes (Signed)
[address]       [place] [address]             [phone]     CARDIOTHORACIC SURGERY OFFICE NOTE  Referring Provider is Sherren Mocha, MD PCP is Myriam Jacobson, MD   HPI:  Patient returns for followup approximately 2 years status post coronary artery bypass grafting x2 and Maze procedure on 01/27/2013. He was last seen here in the office on 01/23/2014 and since then he was seen in followup by Dr. Burt Knack on 08/23/2014.He continues to do exceptionally well. He reports no physical limitations. He states that he only gets tired or fatigue when he really pushes himself strenuously, and he has not had any symptoms of exertional chest pain or chest tightness. He denies any history of palpitations or dizzy spells. He reports no new medical problems over the past year. He states that his hemoglobin A1c had creeped up slightly to 7.0 when it was checked recently.   Current Outpatient Prescriptions  Medication Sig Dispense Refill  . aspirin EC 81 MG EC tablet Take 1 tablet (81 mg total) by mouth daily.    . cetirizine (ZYRTEC) 10 MG tablet Take 10 mg by mouth daily.     Marland Kitchen glucose blood (CHOICE DM FORA G20 TEST STRIPS) test strip Use as instructed 100 each 12  . glucose monitoring kit (FREESTYLE) monitoring kit 1 each by Does not apply route as needed for other. 1 each 0  . Lancets (ONETOUCH ULTRASOFT) lancets Use as instructed 100 each 12  . losartan (COZAAR) 25 MG tablet Take 1 tablet (25 mg total) by mouth daily. 90 tablet 3  . metFORMIN (GLUCOPHAGE) 500 MG tablet Take 250 mg by mouth daily with breakfast.     . metoprolol tartrate (LOPRESSOR) 25 MG tablet Take 1 tablet (25 mg total) by mouth daily. 30 tablet 3   No current facility-administered medications for this visit.      Physical Exam:   BP 128/73 mmHg  Pulse 73  Resp 20  Ht _0  (1.803 m)  Wt 236 lb (107.049 kg)  BMI 32.93 kg/m2  SpO2 97%  General:  Well-appearing  Chest:   Clear to  auscultation  CV:   Regular rate and rhythm  Incisions:  Completely healed  Abdomen:  Soft and nontender  Extremities:  Warm and well-perfused  Diagnostic Tests:  2 channel telemetry rhythm strip demonstrates sinus rhythm with unifocal PVC   Impression:  Patient remains very stable from a cardiac standpoint now 2 years status post coronary artery bypass grafting and Maze procedure.  Plan:  The patient will continue to follow-up with Dr. Burt Knack for long-term follow-up treatment for coronary artery disease, hypertension, atrial fibrillation, and hyperlipidemia. He will be referred to the atrial fibrillation clinic for long-term surveillance status post maze procedure.  He has been reminded regarding the long-term benefits of strict control of his underlying type 2 diabetes mellitus.  I spent in excess of 15 minutes during the conduct of this office consultation and >50% of this time involved direct face-to-face encounter with the patient for counseling and/or coordination of their care.    Valentina Gu. Roxy Manns, MD 02/12/2015 10:47 AM

## 2015-03-04 ENCOUNTER — Emergency Department (HOSPITAL_BASED_OUTPATIENT_CLINIC_OR_DEPARTMENT_OTHER)
Admission: EM | Admit: 2015-03-04 | Discharge: 2015-03-04 | Disposition: A | Discharge status: Other Acute Inpatient Hospital | Payer: Medicare Other | Attending: Emergency Medicine | Admitting: Emergency Medicine

## 2015-03-04 ENCOUNTER — Encounter (HOSPITAL_BASED_OUTPATIENT_CLINIC_OR_DEPARTMENT_OTHER): Payer: Self-pay | Admitting: *Deleted

## 2015-03-04 ENCOUNTER — Emergency Department (HOSPITAL_COMMUNITY): Payer: Medicare Other

## 2015-03-04 DIAGNOSIS — N508 Other specified disorders of male genital organs: Secondary | ICD-10-CM | POA: Diagnosis not present

## 2015-03-04 DIAGNOSIS — N5089 Other specified disorders of the male genital organs: Secondary | ICD-10-CM

## 2015-03-04 DIAGNOSIS — E785 Hyperlipidemia, unspecified: Secondary | ICD-10-CM | POA: Insufficient documentation

## 2015-03-04 DIAGNOSIS — N453 Epididymo-orchitis: Secondary | ICD-10-CM

## 2015-03-04 DIAGNOSIS — N5082 Scrotal pain: Secondary | ICD-10-CM

## 2015-03-04 DIAGNOSIS — I249 Acute ischemic heart disease, unspecified: Secondary | ICD-10-CM | POA: Insufficient documentation

## 2015-03-04 LAB — URINALYSIS, ROUTINE W REFLEX MICROSCOPIC
Bilirubin Urine: NEGATIVE
Glucose, UA: NEGATIVE mg/dL
Hgb urine dipstick: NEGATIVE
Ketones, ur: NEGATIVE mg/dL
Leukocytes, UA: NEGATIVE
Nitrite: NEGATIVE
Protein, ur: NEGATIVE mg/dL
Specific Gravity, Urine: 1.016 (ref 1.005–1.030)
Urobilinogen, UA: 0.2 mg/dL (ref 0.0–1.0)
pH: 6 (ref 5.0–8.0)

## 2015-03-04 MED ORDER — LEVOFLOXACIN 750 MG PO TABS
750.0000 mg | ORAL_TABLET | Freq: Once | ORAL | Status: AC
  Administered 2015-03-04: 750 mg via ORAL
  Filled 2015-03-04: qty 1

## 2015-03-04 MED ORDER — LEVOFLOXACIN 750 MG PO TABS: 750.0000 mg | ORAL_TABLET | Freq: Every day | ORAL | Status: DC

## 2015-03-04 NOTE — ED Notes (Signed)
Denies any difficulty urinating

## 2015-03-04 NOTE — ED Notes (Signed)
Bed: WA09 Expected date:  Expected time:  Means of arrival:  Comments: Med center transfer coming  POV

## 2015-03-04 NOTE — ED Notes (Signed)
Presents w/ Lt testicular pain, lt side, with onset 3 days ago, denies any injury to area

## 2015-03-04 NOTE — ED Notes (Signed)
MD at bedside. 

## 2015-03-04 NOTE — ED Notes (Signed)
Urine sample to lab per EDP verbal orders

## 2015-03-04 NOTE — Discharge Instructions (Signed)
Go to [facility] Emergency Department for an ultrasound.   Orchitis Orchitis is an infection of the testicle of usually sudden onset (happens quickly). It may be viral or bacterial (caused by germs). Usually with this illness there is generalized malaise (not feeling well) and fever. There is also pain. There is usually tenderness and swelling of the scrotum and testicle. DIAGNOSIS  Your caregiver will perform an exam to make sure there is not another reason for the pain in your testicle. A rectal exam may be done to find out if the prostate is swollen and tender. Blood work may be done to see if your white blood cell count is elevated. This can help determine if an infection is viral or bacterial. A urinalysis can also determine what type of infection is present. Most bacterial infections can be treated with antibiotics (medications which kill germs). LET YOUR CAREGIVER KNOW ABOUT:  Allergies.  Medications taken including herbs, eye drops, over the counter medications, and creams.  Use of steroids (by mouth or creams).  Previous problems with anesthetics or novocaine.  Previous prostate infections.  History of blood clots (thrombophlebitis).  History of bleeding or blood problems.  Previous surgery.  Previous urinary tract infection.  Other health problems. HOME CARE INSTRUCTIONS   Apply cold packs to the scrotal area for twenty minutes, four times per day or as needed.  A scrotal support may be helpful. Keep a small pillow or support under your testicles while lying or sitting down.  Only take over-the-counter or prescription medicines for pain, discomfort, or fever as directed by your caregiver.  Take all medications, including antibiotics, as directed. Take the antibiotics for the full prescribed length of time even if you are feeling better. SEEK IMMEDIATE MEDICAL CARE IF:   Your redness, swelling, or pain in the testicle increases or is not getting better.  You  have a fever.  You have pain not relieved with medicines.  You have any worsening of any symptoms (problems) that originally brought you in for medical care. Document Released: 11/14/2000 Document Revised: 02/09/2012 Document Reviewed: 11/17/2005 [employer] Patient Information 2015 [place], [place]. This information is not intended to replace advice given to you by your health care provider. Make sure you discuss any questions you have with your health care provider.

## 2015-03-04 NOTE — ED Provider Notes (Signed)
Transferred from [place] for scrotal swelling. 3 days of testicular swelling and pain. Here with L sided mass, soft in scrotal. No evidence of hernia. [place] ordered. Patient refused pain meds. UA negative.  [place] with orchitis, epididymitis. Given levaquin, Urology f/u.  1. Scrotal mass   2. Scrotal pain   3. Orchitis and epididymitis      Evelina Bucy, MD 03/04/15 [phone]

## 2015-03-04 NOTE — ED Provider Notes (Signed)
CSN: [id number]     Arrival date & time 03/04/15  0721 History   First MD Initiated Contact with Patient 03/04/15 [phone]     Chief Complaint  Patient presents with  . Testicle Pain     (Consider location/radiation/quality/duration/timing/severity/associated sxs/prior Treatment) Patient is a 74 y.o. male presenting with testicular pain and male genitourinary complaint.  Testicle Pain Pertinent negatives include no abdominal pain.  Male GU Problem Presenting symptoms: scrotal pain   Context: spontaneously   Relieved by:  Nothing Worsened by:  Nothing tried Associated symptoms: no abdominal pain, no fever, no nausea, no urinary frequency and no vomiting     Past Medical History  Diagnosis Date  . Coronary artery disease   . Hyperlipidemia     takes Zetia daily  . Allergy   . Bronchitis   . Sleep apnea   . Cardiomyopathy, ischemic   . Obesity (BMI 30-39.9)   . 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 Complication of anesthesia     pt states he gets "crazy" after anesthesia  . Atrial fibrillation     persistent, failed DCCV;takes Metoprolol daily and taking Pacerone for 7days prior to surgery  . Hay fever     takes Zyrtec daily  . History of bronchitis     last time a year ago  . History of kidney stones   . Type II diabetes mellitus     takes Metformin and Januvia daily;pt states he is not a diabetic but pre diabetic  . 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 S/P Maze operation for atrial fibrillation 01/27/2013    Complete bilateral lesion set using bipolar radiofrequency and cryothermy ablation with clipping of LA appendage  . Pleural effusion, left 02/21/2013   Past Surgical History  Procedure Laterality Date  . Tonsillectomy  1952  . Nasal septum surgery  2004    and sinus repair penta  . Umbilical hernia repair  01/2010  . Hernia repair  12/18/2009    Incarcerated umbilical hernia  . Cardioversion   11/30/2012    Procedure: CARDIOVERSION;  Surgeon: Sherren Mocha, MD;  Location: [facility] ENDOSCOPY;  Service: Cardiovascular;  Laterality: [name];  . Cystoscopy    . Skin taken off of top of mouth and reapplied to gum    . Coronary angioplasty with stent placement  01/17/13 and in 2008    1 stent placed in 2008  . Coronary artery bypass graft [name] 01/27/2013    Procedure: CORONARY ARTERY BYPASS GRAFTING (CABG) x  two; using left internal mammary artery and right leg greater saphenous vein harvested endoscopically;  Surgeon: Rexene Alberts, MD;  Location: [place];  Service: Open Heart Surgery;  Laterality: [name];  . Maze [name] 01/27/2013    Procedure: MAZE;  Surgeon: Rexene Alberts, MD;  Location: [place];  Service: Open Heart Surgery;  Laterality: [name];  . Intraoperative transesophageal echocardiogram [name] 01/27/2013    Procedure: INTRAOPERATIVE TRANSESOPHAGEAL ECHOCARDIOGRAM;  Surgeon: Rexene Alberts, MD;  Location: [place];  Service: Open Heart Surgery;  Laterality: [name];   Family History  Problem Relation Age of Onset  . Heart disease Mother   . Diabetes Father   . Lung disease Father   . Cancer Father     colon   History  Substance Use Topics  . Smoking status: Former Research scientist (life sciences)  . Smokeless tobacco: Never Used  Comment: quit in 1980  . Alcohol Use: Yes     Comment: beer occasionally    Review of Systems  Constitutional: Negative for fever.  Gastrointestinal: Negative for nausea, vomiting and abdominal pain.  Genitourinary: Positive for testicular pain. Negative for frequency.  All other systems reviewed and are negative.     Allergies  Asa arthritis strength-antacid; Metformin and related; and Statins  Home Medications   Prior to Admission medications   Medication Sig Start Date End Date Taking? Authorizing Provider  aspirin EC 81 MG EC tablet Take 1 tablet (81 mg total) by mouth daily. 02/01/13   Erin R Barrett, PA-C  cetirizine (ZYRTEC) 10 MG tablet Take 10 mg by mouth daily.      Historical Provider, MD  glucose blood (CHOICE DM FORA G20 TEST STRIPS) test strip Use as instructed 02/04/13   Erin R Barrett, PA-C  glucose monitoring kit (FREESTYLE) monitoring kit 1 each by Does not apply route as needed for other. 02/04/13   Erin R Barrett, PA-C  Lancets (ONETOUCH ULTRASOFT) lancets Use as instructed 02/04/13   Erin R Barrett, PA-C  losartan (COZAAR) 25 MG tablet Take 1 tablet (25 mg total) by mouth daily. 04/26/13   Sherren Mocha, MD  metFORMIN (GLUCOPHAGE) 500 MG tablet Take 250 mg by mouth daily with breakfast.     Historical Provider, MD  metoprolol tartrate (LOPRESSOR) 25 MG tablet Take 1 tablet (25 mg total) by mouth daily. 11/21/14   Sherren Mocha, MD   BP 133/62 mmHg  Pulse 68  Temp(Src) 97.6 F (36.4 C) (Oral)  Resp 18  Ht '5\' 11"'  (1.803 m)  Wt 226 lb (102.513 kg)  BMI 31.53 kg/m2  SpO2 97% Physical Exam  Constitutional: He is oriented to person, place, and time. He appears well-developed and well-nourished. No distress.  HENT:  Head: Normocephalic and atraumatic.  Eyes: Conjunctivae are normal. No scleral icterus.  Neck: Neck supple.  Cardiovascular: Normal rate and intact distal pulses.   Pulmonary/Chest: Effort normal. No stridor. No respiratory distress.  Abdominal: Normal appearance. He exhibits no distension.  Genitourinary: Right testis shows no mass, no swelling and no tenderness. Left testis shows mass (soft tender mass proximal to left testicle. ), swelling and tenderness.  Neurological: He is alert and oriented to person, place, and time.  Skin: Skin is warm and dry. No rash noted.  Psychiatric: He has a normal mood and affect. His behavior is normal.  Nursing note and vitals reviewed.   ED Course  Procedures (including critical care time) Labs Review Labs Reviewed  URINALYSIS, ROUTINE W REFLEX MICROSCOPIC    Imaging Review No results found.   EKG Interpretation None      MDM   Final diagnoses:  Scrotal mass   74 yo male with  left scrotal mass and pain.  Well appearing, but has tender mass in left hemiscrotum.  [place] not available, so I think he needs to go to [facility] for an Ultrasound.      Serita Grit, MD 03/04/15 [phone]

## 2015-03-30 ENCOUNTER — Encounter (INDEPENDENT_AMBULATORY_CARE_PROVIDER_SITE_OTHER): Payer: Self-pay | Admitting: Ophthalmology

## 2015-03-30 ENCOUNTER — Encounter (INDEPENDENT_AMBULATORY_CARE_PROVIDER_SITE_OTHER): Payer: Medicare Other | Admitting: Ophthalmology

## 2015-03-30 DIAGNOSIS — H5340 Unspecified visual field defects: Secondary | ICD-10-CM | POA: Diagnosis not present

## 2015-03-30 DIAGNOSIS — H2513 Age-related nuclear cataract, bilateral: Secondary | ICD-10-CM | POA: Diagnosis not present

## 2015-03-30 DIAGNOSIS — H31001 Unspecified chorioretinal scars, right eye: Secondary | ICD-10-CM

## 2015-03-30 DIAGNOSIS — H40013 Open angle with borderline findings, low risk, bilateral: Secondary | ICD-10-CM | POA: Diagnosis not present

## 2015-03-30 DIAGNOSIS — H43813 Vitreous degeneration, bilateral: Secondary | ICD-10-CM | POA: Diagnosis not present

## 2015-05-19 ENCOUNTER — Other Ambulatory Visit: Payer: Self-pay | Admitting: Cardiovascular Disease

## 2015-08-13 ENCOUNTER — Ambulatory Visit (INDEPENDENT_AMBULATORY_CARE_PROVIDER_SITE_OTHER): Payer: Medicare Other | Admitting: Cardiovascular Disease

## 2015-08-13 ENCOUNTER — Encounter: Payer: Self-pay | Admitting: Cardiovascular Disease

## 2015-08-13 VITALS — BP 128/84 | HR 66 | Ht 71.0 in | Wt 232.0 lb

## 2015-08-13 DIAGNOSIS — I2510 Atherosclerotic heart disease of native coronary artery without angina pectoris: Secondary | ICD-10-CM | POA: Diagnosis not present

## 2015-08-13 DIAGNOSIS — I10 Essential (primary) hypertension: Secondary | ICD-10-CM

## 2015-08-13 DIAGNOSIS — E785 Hyperlipidemia, unspecified: Secondary | ICD-10-CM | POA: Diagnosis not present

## 2015-08-13 NOTE — Patient Instructions (Signed)
Medication Instructions:  Your physician recommends that you continue on your current medications as directed. Please refer to the Current Medication list given to you today.  Labwork: Your physician recommends that you return for a FASTING LIPID and LIVER profile--nothing to eat or drink after midnight, lab opens at 7:30 AM  Testing/Procedures: No new orders.   Follow-Up: You have been referred to the [facility] for PCSK9 inhibitor evaluation.   Your physician wants you to follow-up in: 6 MONTHS with Dr Burt Knack.  You will receive a reminder letter in the mail two months in advance. If you don't receive a letter, please call our office to schedule the follow-up appointment.   Any Other Special Instructions Will Be Listed Below (If Applicable).

## 2015-08-13 NOTE — Progress Notes (Signed)
Cardiology Office Note Date:  08/13/2015   ID:  [name], DOB [birth year], MRN [id number]  PCP:  Myriam Jacobson, MD  Cardiologist:  Sherren Mocha, MD    Chief Complaint  Patient presents with  . Coronary Artery Disease   History of Present Illness: [name] is a 74 y.o. male who presents for follow-up evaluation.  The patient is followed for atrial fibrillation and coronary artery disease. He presented with a myocardial infarction initially in 2008 and was treated with stenting of the right coronary artery. He followed up and was found to have atrial fibrillation in 2013. He had progressive LV dysfunction. Cardiac catheterization demonstrated progressive multivessel coronary artery disease. He was ultimately treated with two-vessel CABG and cryo- Cox-Maze surgery in February 2014.  The patient is doing well. He denies chest pain or shortness of breath. He remains physically active and does very hard physical work. He has been working long hours and has not been engaged in regular exercise. No lightheadedness, syncope, orthopnea, PND, or heart palpitations.   Past Medical History  Diagnosis Date  . Coronary artery disease   . Hyperlipidemia     takes Zetia daily  . Allergy   . Bronchitis   . Sleep apnea   . Cardiomyopathy, ischemic   . Obesity (BMI 30-39.9)   . 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 Complication of anesthesia     pt states he gets "crazy" after anesthesia  . Atrial fibrillation     persistent, failed DCCV;takes Metoprolol daily and taking Pacerone for 7days prior to surgery  . Hay fever     takes Zyrtec daily  . History of bronchitis     last time a year ago  . History of kidney stones   . Type II diabetes mellitus     takes Metformin and Januvia daily;pt states he is not a diabetic but pre diabetic  . 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 S/P Maze operation for  atrial fibrillation 01/27/2013    Complete bilateral lesion set using bipolar radiofrequency and cryothermy ablation with clipping of LA appendage  . Pleural effusion, left 02/21/2013    Past Surgical History  Procedure Laterality Date  . Tonsillectomy  1952  . Nasal septum surgery  2004    and sinus repair penta  . Umbilical hernia repair  01/2010  . Hernia repair  12/18/2009    Incarcerated umbilical hernia  . Cardioversion  11/30/2012    Procedure: CARDIOVERSION;  Surgeon: Sherren Mocha, MD;  Location: [facility] ENDOSCOPY;  Service: Cardiovascular;  Laterality: [name];  . Cystoscopy    . Skin taken off of top of mouth and reapplied to gum    . Coronary angioplasty with stent placement  01/17/13 and in 2008    1 stent placed in 2008  . Coronary artery bypass graft [name] 01/27/2013    Procedure: CORONARY ARTERY BYPASS GRAFTING (CABG) x  two; using left internal mammary artery and right leg greater saphenous vein harvested endoscopically;  Surgeon: Rexene Alberts, MD;  Location: [place];  Service: Open Heart Surgery;  Laterality: [name];  . Maze [name] 01/27/2013    Procedure: MAZE;  Surgeon: Rexene Alberts, MD;  Location: [place];  Service: Open Heart Surgery;  Laterality: [name];  . Intraoperative transesophageal echocardiogram [name] 01/27/2013    Procedure: INTRAOPERATIVE TRANSESOPHAGEAL ECHOCARDIOGRAM;  Surgeon: Valentina Gu  Roxy Manns, MD;  Location: [place];  Service: Open Heart Surgery;  Laterality: [name];    Current Outpatient Prescriptions  Medication Sig Dispense Refill  . aspirin EC 81 MG EC tablet Take 1 tablet (81 mg total) by mouth daily.    Marland Kitchen glucose blood (CHOICE DM FORA G20 TEST STRIPS) test strip Use as instructed 100 each 12  . glucose monitoring kit (FREESTYLE) monitoring kit 1 each by Does not apply route as needed for other. 1 each 0  . Lancets (ONETOUCH ULTRASOFT) lancets Use as instructed 100 each 12  . losartan (COZAAR) 25 MG tablet Take 1 tablet (25 mg total) by mouth daily. 90 tablet 3  . metFORMIN  (GLUCOPHAGE) 500 MG tablet Take 250 mg by mouth 2 (two) times daily with a meal.     . metoprolol succinate (TOPROL-XL) 25 MG 24 hr tablet Take 25 mg by mouth daily.    . Multiple Vitamins-Minerals (MULTIVITAMIN WITH MINERALS) tablet Take 1 tablet by mouth daily.     No current facility-administered medications for this visit.    Allergies:   Asa arthritis strength-antacid; Metformin and related; and Statins   Social History:  The patient  reports that he has quit smoking. He has never used smokeless tobacco. He reports that he drinks alcohol. He reports that he does not use illicit drugs.   Family History:  The patient's  family history includes Cancer in his father; Diabetes in his father; Heart disease in his mother; Lung disease in his father.    ROS:  Please see the history of present illness.  Otherwise, review of systems is positive for  cough.  All other systems are reviewed and negative.    PHYSICAL EXAM: VS:  BP 128/84 mmHg  Pulse 66  Ht _0  (1.803 m)  Wt 232 lb (105.235 kg)  BMI 32.37 kg/m2 , BMI Body mass index is 32.37 kg/(m^2). GEN: Well nourished, well developed, in no acute distress HEENT: normal Neck: no JVD, no masses. No carotid bruits Cardiac: RRR without murmur or gallop                Respiratory:  clear to auscultation bilaterally, normal work of breathing GI: soft, nontender, nondistended, + BS MS: no deformity or atrophy Ext: no pretibial edema, pedal pulses 2+= bilaterally Skin: warm and dry, no rash Neuro:  Strength and sensation are intact Psych: euthymic mood, full affect  EKG:  EKG is ordered today. The ekg ordered today shows  Normal sinus rhythm 66 bpm , frequent PVCs , age indeterminate inferior infarct.  Recent Labs: No results found for requested labs within last 365 days.   Lipid Panel     Component Value Date/Time   CHOL 172 10/25/2012 0911   TRIG 78.0 10/25/2012 0911   HDL 33.40* 10/25/2012 0911   CHOLHDL 5 10/25/2012 0911    VLDL 15.6 10/25/2012 0911   LDLCALC 123* 10/25/2012 0911      Wt Readings from Last 3 Encounters:  08/13/15 232 lb (105.235 kg)  03/04/15 226 lb (102.513 kg)  02/12/15 236 lb (107.049 kg)     Cardiac Studies Reviewed: Exercise Tolerance Test Ordering MD: Sherren Mocha, MD  Interpreting MD: Sherren Mocha, MD  Unique Test No: 1  Treadmill: 1  Indication for ETT: known ASHD  Contraindication to ETT: No   Stress Modality: exercise - treadmill  Cardiac Imaging Performed: non   Protocol: standard Bruce - maximal  Max BP: 204/83  Max MPHR (bpm): 149 85% MPR (bpm):  127  MPHR obtained (bpm): 130 % MPHR obtained: 87  Reached 85% MPHR (min:sec): 8:40 Total Exercise Time (min-sec): 9:00  Workload in METS: 10.1 Borg Scale: 13  Reason ETT Terminated: dyspnea    ST Segment Analysis At Rest:normal ST segments - no evidence of significant ST depression With Exercise:no evidence of significant ST depression  Other Information Arrhythmia: NoAngina during ETT: absent (0) Quality of ETT: diagnostic  ETT Interpretation: normal - no evidence of ischemia by ST analysis  Comments: Good exercise tolerance. No angina, arrhythmia, or significant ST depression with exercise. This is a negative (adequate) stress test.  Recommendations: Continue current medical therapy.       ASSESSMENT AND PLAN: 1.   CAD, native vessel, without symptoms of angina: The patient has had coronary bypass surgery within the past 5 years. His exercise treadmill study was negative one year ago. Will continue his current medical program.  2. Paroxysmal atrial fibrillation: no recurrence since undergoing surgical Maze procedure.  3. Essential hypertension: Blood pressure is well controlled on combination of losartan and metoprolol succinate.   4. Hyperlipidemia: the patient  Has been unable to take statin drugs because of side effects. I think he requires  aggressive risk reduction considering his previous MRI, and coronary bypass surgery, and diabetes. I will refer him to the [facility] for consideration of a PCSK9 inhibitor. Lifestyle modification reviewed.    Current medicines are reviewed with the patient today.  The patient does not have concerns regarding medicines.  Labs/ tests ordered today include:   Orders Placed This Encounter  Procedures  . Lipid panel  . Hepatic function panel  . Ambulatory referral to Lipid Clinic  . EKG 12-Lead    Disposition:   FU 6 months  Signed, Sherren Mocha, MD  08/13/2015 1:47 PM    [facility] [address], [place], [place]  [address] Phone: [phone]; Fax: [phone]

## 2015-08-30 ENCOUNTER — Other Ambulatory Visit (INDEPENDENT_AMBULATORY_CARE_PROVIDER_SITE_OTHER): Payer: Medicare Other | Admitting: *Deleted

## 2015-08-30 DIAGNOSIS — E785 Hyperlipidemia, unspecified: Secondary | ICD-10-CM | POA: Diagnosis not present

## 2015-08-30 DIAGNOSIS — I2510 Atherosclerotic heart disease of native coronary artery without angina pectoris: Secondary | ICD-10-CM

## 2015-08-30 DIAGNOSIS — I10 Essential (primary) hypertension: Secondary | ICD-10-CM

## 2015-08-30 LAB — HEPATIC FUNCTION PANEL
ALT: 21 U/L (ref 0–53)
AST: 19 U/L (ref 0–37)
Albumin: 3.9 g/dL (ref 3.5–5.2)
Alkaline Phosphatase: 44 U/L (ref 39–117)
Bilirubin, Direct: 0.1 mg/dL (ref 0.0–0.3)
Total Bilirubin: 0.8 mg/dL (ref 0.2–1.2)
Total Protein: 6.7 g/dL (ref 6.0–8.3)

## 2015-08-30 LAB — LIPID PANEL
Cholesterol: 189 mg/dL (ref 0–200)
HDL: 35.4 mg/dL — ABNORMAL LOW (ref >39.00)
LDL Cholesterol: 133 mg/dL — ABNORMAL HIGH (ref 0–99)
NonHDL: 153.68
Total CHOL/HDL Ratio: 5
Triglycerides: 104 mg/dL (ref 0.0–149.0)
VLDL: 20.8 mg/dL (ref 0.0–40.0)

## 2015-08-31 ENCOUNTER — Telehealth: Payer: Self-pay | Admitting: Pharmacist

## 2015-08-31 ENCOUNTER — Ambulatory Visit (INDEPENDENT_AMBULATORY_CARE_PROVIDER_SITE_OTHER): Payer: Medicare Other | Admitting: Pharmacist

## 2015-08-31 DIAGNOSIS — E785 Hyperlipidemia, unspecified: Secondary | ICD-10-CM | POA: Diagnosis not present

## 2015-08-31 MED ORDER — ALIROCUMAB 75 MG/ML ~~LOC~~ SOPN: 1.0000 "pen " | PEN_INJECTOR | SUBCUTANEOUS | Status: DC

## 2015-08-31 NOTE — Progress Notes (Signed)
Patient ID: [name]            DOB: [birth year], 74 yo             MRN: [id number]          HPI: [name] is a 74 y.o. male patient referred to lipid clinic by Dr. Burt Suarez. PMH is significant for HTN, HLD, T2DM, and MI in 2008 followed by PCI of the RCA. He underwent cardiac catheterization in 2014 which showed afib and progressive multivessel CAD that was treated with Maze surgery and two-vessel CABG. He has a history of statin intolerance to 4 statins plus Zetia and presents today for lipid management.  Current Medications: none Intolerances: Lipitor 94m daily - took for 1 month, Zocor, Crestor, and lovastatin. Debilitating myalgias after taking a single dose of Zocor and Crestor. He also reports that statins increased his blood sugar readings significantly. He tried both red yeast rice and Zetia 145min 2014 and could not tolerate these due to myalgias as well. Patient refuses to try another statin given how quickly his myalgias appeared after taking multiple statins. Risk Factors: CAD s/p PCI and CABG x2, age, sex, LDL > 13020mL LDL goal: <53m34m, non-LDL goal <100mg74m Diet: Breakfast - 1/2 piece of whole wheat toast, grits, 2 eggs, coffee. Lunch - loves greens. Had cheese and crackers and sweet potatoes the other day. Doesn't eat dinner frequently. Drinks mostly water and low-fat milk. Loves red meat but tries to limit it. He eats most meals at home but if he eats on the go he will try to find a grilled chicken wrap.  Exercise: He spends 25 minutes at the gym every morning and focuses on his back and core. Uses free weights and machines.  Family History: Diabetes and cancer in his father, heart disease in his mother.  Social History: Patient quit smoking. He drinks a beer once every week or two. Denies illicit drug use.  Labs: 08/2015: TC 189, TG 104, HDL 35.4, LDL 133, LFTs wnl (no therapy)  Past Medical History  Diagnosis Date  . Coronary artery disease   .  Hyperlipidemia     takes Zetia daily  . Allergy   . Bronchitis   . Sleep apnea   . Cardiomyopathy, ischemic   . Obesity (BMI 30-39.9)   . 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 Complication of anesthesia     pt states he gets "crazy" after anesthesia  . Atrial fibrillation     persistent, failed DCCV;takes Metoprolol daily and taking Pacerone for 7days prior to surgery  . Hay fever     takes Zyrtec daily  . History of bronchitis     last time a year ago  . History of kidney stones   . Type II diabetes mellitus     takes Metformin and Januvia daily;pt states he is not a diabetic but pre diabetic  . 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 S/P Maze operation for atrial fibrillation 01/27/2013    Complete bilateral lesion set using bipolar radiofrequency and cryothermy ablation with clipping of LA appendage  . Pleural effusion, left 02/21/2013    Current Outpatient Prescriptions on File Prior to Visit  Medication Sig Dispense Refill  . aspirin EC 81 MG EC tablet Take 1 tablet (81 mg total) by mouth daily.    . gluMarland Kitchenose blood (CHOICE DM  FORA G20 TEST STRIPS) test strip Use as instructed 100 each 12  . glucose monitoring kit (FREESTYLE) monitoring kit 1 each by Does not apply route as needed for other. 1 each 0  . Lancets (ONETOUCH ULTRASOFT) lancets Use as instructed 100 each 12  . losartan (COZAAR) 25 MG tablet Take 1 tablet (25 mg total) by mouth daily. 90 tablet 3  . metFORMIN (GLUCOPHAGE) 500 MG tablet Take 250 mg by mouth 2 (two) times daily with a meal.     . metoprolol succinate (TOPROL-XL) 25 MG 24 hr tablet Take 25 mg by mouth daily.    . Multiple Vitamins-Minerals (MULTIVITAMIN WITH MINERALS) tablet Take 1 tablet by mouth daily.     No current facility-administered medications on file prior to visit.    Allergies  Allergen Reactions  . Asa Arthritis Strength-Antacid [Aspirin Buffered] Other (See Comments)      Bleeding tendencies- takes 81 mg aspirin  . Metformin And Related Other (See Comments)    headaches  . Statins Other (See Comments)    Aches     Assessment/Plan:  1. Hyperlipidemia - Patient's LDL is currently above goal <43m/dL at 1373mdL. He has previous intolerances to 4 different statins as well as Zetia. The longest he could tolerate taking a statin was a month (Lipitor) before developing myalgias. With Zocor and Crestor, he reports debilitating myalgias after a single dose. Patient already has an active lifestyle and eats a low-fat diet. Given that he is intolerant to 4 statins + Zetia, PCSK9i therapy is the only option left to bring LDL to goal. Will initiate paperwork for Praluent coverage. Discussed benefits, injection technique, potential copay and donut hole with patient. He is agreeable with plan.    [name], PharmD CoKings [id number]. Ch[address] Trout StreetGrMaryhill EstatesNC [address]hone: [phone]: [phone]/30/2016 10:15 AM

## 2015-08-31 NOTE — Telephone Encounter (Signed)
Called patient to let him know Praluent has been approved by [facility]. Will send rx for Praluent 75mg  Dickson Q14d to [place]. Patient will call once delivery is set up and he starts injections so that we can schedule lipid panel.

## 2015-11-12 ENCOUNTER — Other Ambulatory Visit: Payer: Self-pay

## 2015-11-12 DIAGNOSIS — I25810 Atherosclerosis of coronary artery bypass graft(s) without angina pectoris: Secondary | ICD-10-CM

## 2015-11-16 ENCOUNTER — Encounter (INDEPENDENT_AMBULATORY_CARE_PROVIDER_SITE_OTHER): Payer: Medicare Other

## 2015-11-16 ENCOUNTER — Encounter: Payer: Self-pay | Admitting: Cardiovascular Disease

## 2015-11-16 ENCOUNTER — Other Ambulatory Visit: Payer: Self-pay

## 2015-11-16 ENCOUNTER — Ambulatory Visit: Payer: Medicare Other | Admitting: Cardiovascular Disease

## 2015-11-16 DIAGNOSIS — I25810 Atherosclerosis of coronary artery bypass graft(s) without angina pectoris: Secondary | ICD-10-CM | POA: Diagnosis not present

## 2015-11-16 LAB — EXERCISE TOLERANCE TEST
Estimated workload: 10.1 METS
Exercise duration (min): 9 min
Exercise duration (sec): 0 s
MPHR: 148 {beats}/min
Peak HR: 131 {beats}/min
Percent HR: 88 %
RPE: 14
Rest HR: 64 {beats}/min

## 2015-12-20 ENCOUNTER — Other Ambulatory Visit (INDEPENDENT_AMBULATORY_CARE_PROVIDER_SITE_OTHER): Payer: Medicare Other

## 2015-12-20 ENCOUNTER — Ambulatory Visit (INDEPENDENT_AMBULATORY_CARE_PROVIDER_SITE_OTHER)
Admission: RE | Admit: 2015-12-20 | Discharge: 2015-12-20 | Disposition: A | Discharge status: Home / Self Care | Payer: Medicare Other | Source: Ambulatory Visit | Attending: Family Medicine | Admitting: Family Medicine

## 2015-12-20 ENCOUNTER — Encounter: Payer: Self-pay | Admitting: Family Medicine

## 2015-12-20 ENCOUNTER — Ambulatory Visit (INDEPENDENT_AMBULATORY_CARE_PROVIDER_SITE_OTHER): Payer: Medicare Other | Admitting: Family Medicine

## 2015-12-20 VITALS — BP 132/82 | HR 67 | Ht 71.0 in | Wt 237.0 lb

## 2015-12-20 DIAGNOSIS — M75101 Unspecified rotator cuff tear or rupture of right shoulder, not specified as traumatic: Secondary | ICD-10-CM | POA: Diagnosis not present

## 2015-12-20 DIAGNOSIS — M545 Low back pain, unspecified: Secondary | ICD-10-CM

## 2015-12-20 DIAGNOSIS — M25511 Pain in right shoulder: Secondary | ICD-10-CM

## 2015-12-20 DIAGNOSIS — M25562 Pain in left knee: Secondary | ICD-10-CM

## 2015-12-20 NOTE — Progress Notes (Signed)
Pre visit review using our clinic review tool, if applicable. No additional management support is needed unless otherwise documented below in the visit note. 

## 2015-12-20 NOTE — Patient Instructions (Signed)
Good to meet you You got a lot going on and it will take a little time to fix it all Started with shoulder today We will get xrays and MRI Ice 20 minutes 2 times daily. Usually after activity and before bed. pennsaid pinkie amount topically 2 times daily as needed.  Physical therapy will be calling you for the  back and the knee.  Tylenol 500mg  3 times daily scheduled for the arthritis.  It is going to be a little time to get you all better.  I will call you with the results of the MRI and if need surgery I will refer you We can focus on the back and the knee more at another visit when we know what is going on with the shoulder

## 2015-12-20 NOTE — Assessment & Plan Note (Signed)
Patient left knee is tender over the medial joint line as well as has a positive McMurray's. There is a concern for patient having a meniscal tear. 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 I believe he does have an acute on chronic tear. Once again at this time but prior to needs to be his shoulder. We discussed different exercises patient to do at this time such as biking and some mild rehabilitation 4. Patient will be sent to formal physical therapy as well that I think will be helpful. Depending on how patient response to the shoulder workup then we will decide if we can focus on his knee.
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
## 2015-12-20 NOTE — Assessment & Plan Note (Signed)
Patient does have a large tear of the rotator cuff. Patient has atrophy of the musculature already at this time. Underlying arthritis. Discussed with patient at this time that I do not think he is going to respond to significant conservative therapy. Patient will be sent to formal physical therapy for some rehabilitation, x-rays ordered today, MRI ordered today as well secondary to the significant weakness in the atrophy medicine already. It appears the patient does have a full-thickness tear with a positive empty can sign and a positive painful arc. A 20 on MRI results he may need to be referred for surgical intervention. Also the amount of arthritis could contribute to what type of procedure could be done.

## 2015-12-20 NOTE — Assessment & Plan Note (Signed)
Patient's low back pain has no radicular symptoms. Seems to be more over the L4-L5 paraspinal musculature on the left side. Likely some degenerative disc disease at multiple levels. Secondary to patient's shoulder did not want to do significant workup on this at this time. I do think patient was doing well and is very active going to the gym he states 5 times a week. We discussed different range of motion and core strengthening but to avoid significant extension. Formal physical therapy has been ordered. Depending what happens with his shoulder will discuss when he can follow-up for his back.

## 2015-12-25 ENCOUNTER — Telehealth: Payer: Self-pay | Admitting: Pharmacist

## 2015-12-25 DIAGNOSIS — I2510 Atherosclerotic heart disease of native coronary artery without angina pectoris: Secondary | ICD-10-CM

## 2015-12-25 DIAGNOSIS — E785 Hyperlipidemia, unspecified: Secondary | ICD-10-CM

## 2015-12-25 NOTE — Telephone Encounter (Signed)
LM for pt to call back regarding praluent shipment and to schedule f/u labs.

## 2015-12-25 NOTE — Telephone Encounter (Signed)
Pt returned call and stated that his copay for Praluent is going to be ~$300 every 2 weeks. He is unable to afford this and was told by the pharmacy/insurance that they were going to reevaluate to see if they could lower the co-pay. He has not heard back from the pharmacy regarding the new price. He will call us if they are able to lower the copay and he receives the medication. We have also added him to the [facility] waiting list.

## 2015-12-27 ENCOUNTER — Ambulatory Visit: Payer: Medicare Other | Attending: Physical Medicine & Rehabilitation

## 2015-12-27 DIAGNOSIS — M545 Low back pain, unspecified: Secondary | ICD-10-CM

## 2015-12-27 DIAGNOSIS — M25562 Pain in left knee: Secondary | ICD-10-CM | POA: Insufficient documentation

## 2015-12-27 DIAGNOSIS — R293 Abnormal posture: Secondary | ICD-10-CM | POA: Insufficient documentation
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
## 2015-12-27 NOTE — Patient Instructions (Signed)
Perform all exercises below:  Hold _20___ seconds. Repeat _3___ times.  Do __3__ sessions per day. CAUTION: Movement should be gentle, steady and slow.  Knee to Chest  Lying supine, bend involved knee to chest. Perform with each leg.  Copyright  VHI. All rights reserved.   Lumbar Rotation: Caudal - Bilateral (Supine)  Feet and knees together, arms outstretched, rotate knees left, turning head in opposite direction, until stretch is felt.      HIP: Hamstrings - Short Sitting   Rest leg on raised surface. Keep knee straight. Lift chest.   [facility] [address] [place], [place] [address] Phone # [phone] Fax [phone]

## 2015-12-30 ENCOUNTER — Ambulatory Visit
Admission: RE | Admit: 2015-12-30 | Discharge: 2015-12-30 | Disposition: A | Discharge status: Home / Self Care | Payer: Medicare Other | Source: Ambulatory Visit | Attending: Family Medicine | Admitting: Family Medicine

## 2015-12-30 DIAGNOSIS — M25511 Pain in right shoulder: Secondary | ICD-10-CM

## 2015-12-31 ENCOUNTER — Other Ambulatory Visit: Payer: Self-pay

## 2015-12-31 ENCOUNTER — Telehealth: Payer: Self-pay

## 2015-12-31 DIAGNOSIS — M75101 Unspecified rotator cuff tear or rupture of right shoulder, not specified as traumatic: Secondary | ICD-10-CM

## 2015-12-31 NOTE — Telephone Encounter (Signed)
jody spoke to pt, & he would like to stay with dr Tamera Punt since that's who dr Tamala Julian recommends.

## 2015-12-31 NOTE — Telephone Encounter (Signed)
Spoke with patient. Discussed MRI results. Refer to Dr. Tamera Punt

## 2015-12-31 NOTE — Telephone Encounter (Signed)
Spoke with patient about referral 

## 2015-12-31 NOTE — Telephone Encounter (Signed)
Patient called back in.  States he wants to be referred to the best.  Would like a call back in regards to if he should not be referred to Dr. Onnie Graham?

## 2016-01-02 ENCOUNTER — Ambulatory Visit: Payer: Medicare Other

## 2016-01-04 ENCOUNTER — Encounter: Payer: Medicare Other | Admitting: Physical Therapy

## 2016-01-08 ENCOUNTER — Ambulatory Visit: Payer: Medicare Other | Attending: Physical Medicine & Rehabilitation

## 2016-01-08 DIAGNOSIS — M25562 Pain in left knee: Secondary | ICD-10-CM | POA: Diagnosis present

## 2016-01-08 DIAGNOSIS — M545 Low back pain, unspecified: Secondary | ICD-10-CM

## 2016-01-08 DIAGNOSIS — R293 Abnormal posture: Secondary | ICD-10-CM | POA: Diagnosis present

## 2016-01-08 DIAGNOSIS — M25511 Pain in right shoulder: Secondary | ICD-10-CM | POA: Insufficient documentation

## 2016-01-08 DIAGNOSIS — R29898 Other symptoms and signs involving the musculoskeletal system: Secondary | ICD-10-CM | POA: Insufficient documentation

## 2016-01-08 NOTE — Patient Instructions (Signed)
   Lifting Principles  Maintain proper posture and head alignment. Slide object as close as possible before lifting. Move obstacles out of the way. Test before lifting; ask for help if too heavy. Tighten stomach muscles without holding breath. Use smooth movements; do not jerk. Use legs to do the work, and pivot with feet. Distribute the work load symmetrically and close to the center of trunk. Push instead of pull whenever possible.   Squat down and hold basket close to stand. Use leg muscles to do the work.    Avoid twisting or bending back. Pivot around using foot movements, and bend at knees if needed when reaching for articles.        Getting Into / Out of Bed   Lower self to lie down on one side by raising legs and lowering head at the same time. Use arms to assist moving without twisting. Bend both knees to roll onto back if desired. To sit up, start from lying on side, and use same move-ments in reverse. Keep trunk aligned with legs.    Shift weight from front foot to back foot as item is lifted off shelf.    When leaning forward to pick object up from floor, extend one leg out behind. Keep back straight. Hold onto a sturdy support with other hand.      Sit upright, head facing forward. Try using a roll to support lower back. Keep shoulders relaxed, and avoid rounded back. Keep hips level with knees. Avoid crossing legs for long periods.     [facility] [address], [address] [place], [place] [address] Phone # [phone] Fax [phone]  

## 2016-01-08 NOTE — Therapy (Signed)
[facility] [address] [place], [place], [address] Phone: [phone]   Fax:  [phone]  Physical Therapy Treatment  Patient Details  Name: [name] MRN: [id number] Date of Birth: 05/26/1941 Referring Provider: Hulan Saas, MD  Encounter Date: 01/08/2016      PT End of Session - 01/08/16 1001    Visit Number 2   Number of Visits 10   Date for PT Re-Evaluation 02/21/16   PT Start Time 0932   PT Stop Time 1020   PT Time Calculation (min) 48 min   Activity Tolerance Patient tolerated treatment well   Behavior During Therapy [facility] for tasks assessed/performed      Past Medical History  Diagnosis Date  . Coronary artery disease   . Hyperlipidemia     takes Zetia daily  . Allergy   . Bronchitis   . Sleep apnea   . Cardiomyopathy, ischemic   . Obesity (BMI 30-39.9)   . Myocardial infarction [facility]) 2008    inferior wall, treated with BMS in RCA  . Complication of anesthesia     pt states he gets "crazy" after anesthesia  . Atrial fibrillation ([place])     persistent, failed DCCV;takes Metoprolol daily and taking Pacerone for 7days prior to surgery  . Hay fever     takes Zyrtec daily  . History of bronchitis     last time a year ago  . History of kidney stones   . Type II diabetes mellitus (HCC)     takes Metformin and Januvia daily;pt states he is not a diabetic but pre diabetic  . 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 S/P Maze operation for atrial fibrillation 01/27/2013    Complete bilateral lesion set using bipolar radiofrequency and cryothermy ablation with clipping of LA appendage  . Pleural effusion, left 02/21/2013    Past Surgical History  Procedure Laterality Date  . Tonsillectomy  1952  . Nasal septum surgery  2004    and sinus repair penta  . Umbilical hernia repair  01/2010  . Hernia repair  12/18/2009    Incarcerated umbilical hernia  .  Cardioversion  11/30/2012    Procedure: CARDIOVERSION;  Surgeon: Sherren Mocha, MD;  Location: [facility] ENDOSCOPY;  Service: Cardiovascular;  Laterality: [name];  . Cystoscopy    . Skin taken off of top of mouth and reapplied to gum    . Coronary angioplasty with stent placement  01/17/13 and in 2008    1 stent placed in 2008  . Coronary artery bypass graft [name] 01/27/2013    Procedure: CORONARY ARTERY BYPASS GRAFTING (CABG) x  two; using left internal mammary artery and right leg greater saphenous vein harvested endoscopically;  Surgeon: Rexene Alberts, MD;  Location: [place];  Service: Open Heart Surgery;  Laterality: [name];  . Maze [name] 01/27/2013    Procedure: MAZE;  Surgeon: Rexene Alberts, MD;  Location: [place];  Service: Open Heart Surgery;  Laterality: [name];  . Intraoperative transesophageal echocardiogram [name] 01/27/2013    Procedure: INTRAOPERATIVE TRANSESOPHAGEAL ECHOCARDIOGRAM;  Surgeon: Rexene Alberts, MD;  Location: [place];  Service: Open Heart Surgery;  Laterality: [name];    There were no vitals filed for this visit.  Visit Diagnosis:  Knee pain, acute, left  Bilateral low back pain without sciatica  Posture abnormality      Subjective Assessment - 01/08/16 0935    Subjective  Pt reports that he has not been doing his exercises.  Pt didn't attend last week.     Currently in Pain? Yes   Pain Score 2    Pain Location Back   Pain Orientation Left;Lower   Pain Descriptors / Indicators Sharp;Aching   Pain Type Chronic pain   Pain Onset More than a month ago   Pain Frequency Constant   Aggravating Factors  getting in/out of the car, standing/walking, sitting too long   Pain Relieving Factors heat                         [facility] Adult PT Treatment/Exercise - 01/08/16 0001    Exercises   Exercises Knee/Hip;Lumbar   Lumbar Exercises: Stretches   Active Hamstring Stretch 3 reps;20 seconds  seated and supine with strap   Single Knee to Chest Stretch 3 reps;20 seconds   Lower  Trunk Rotation 3 reps;20 seconds   Lumbar Exercises: Seated   Other Seated Lumbar Exercises seated thoracolumbar rotation 3x20 sec.    Knee/Hip Exercises: Aerobic   Nustep Level 7 x 2 minutes   seat 7, arms 10.  Pt stopped early because he was "done"   Modalities   Modalities Electrical Stimulation;Moist Heat   Moist Heat Therapy   Number Minutes Moist Heat 15 Minutes   Moist Heat Location Lumbar Spine   Electrical Stimulation   Electrical Stimulation Location Lt lumbar spine   Electrical Stimulation Action IFC   Electrical Stimulation Parameters 15 minutes   Electrical Stimulation Goals Pain                PT Education - 01/08/16 0946    Education provided Yes   Education Details body mechanics education   Person(s) Educated Patient   Methods Explanation;Demonstration;Handout   Comprehension Verbalized understanding;Returned demonstration          PT Short Term Goals - 12/27/15 0928    PT SHORT TERM GOAL #1   Title be independent in initial HEP   Time 4   Period Weeks   Status New   PT SHORT TERM GOAL #2   Title stand and walk with erect posture and neutral pelvis > or = to 50% of the time   Time 4   Period Weeks   Status New   PT SHORT TERM GOAL #3   Title report a 25% reduction in LBP with change of positions   Time 4   Period Weeks   Status New   PT SHORT TERM GOAL #4   Title report < or = to 3/10 Lt knee pain with standing and walking   Time 4   Period Weeks   Status New   PT SHORT TERM GOAL #5   Title verbalize and demonstrate correct body mechanics with lifting and work tasks to reduce lumbar strain   Time 4   Period Weeks   Status New           PT Long Term Goals - 12/27/15 0835    PT LONG TERM GOAL #1   Title be independent in advanced HEP   Time 8   Period Weeks   Status New   PT LONG TERM GOAL #2   Title reduce FOTO to < or = to 44% limitation   Time 8   Period Weeks   Status New   PT LONG TERM GOAL #3   Title stand erect  with neutral pelivs > or = to 75%  of the time   Time 8   Period Weeks   Status New   PT LONG TERM GOAL #4   Title report a 60% reduction in LBP and knee pain with standing work tasks   Time 8   Period Weeks   Status New   PT LONG TERM GOAL #5   Title return to regular gym exercise program without limitation   Time 8   Period Weeks   Status New   Additional Long Term Goals   Additional Long Term Goals Yes               Plan - 01/08/16 [id number]    Clinical Impression Statement Pt with limited attendance with PT and limited compliance with HEP.  Pt with improved trunk posture in standing and walking.  Pt has been going to the gym and doing some light weight training over the past week.  Pt with continued Lt lumbar pain and tension in the muscle.  Pt will continue to benefit from skilled PT for lumbar flexibility, gentle mobility and modalities for pain.     Pt will benefit from skilled therapeutic intervention in order to improve on the following deficits Abnormal gait;Decreased range of motion;Difficulty walking;Pain;Postural dysfunction;Decreased mobility;Impaired flexibility;Decreased activity tolerance;Increased muscle spasms   Rehab Potential Good   PT Frequency 2x / week   PT Duration 8 weeks   PT Treatment/Interventions ADLs/Self Care Home Management;Cryotherapy;Electrical Stimulation;Moist Heat;Therapeutic exercise;Therapeutic activities;Functional mobility training;Ultrasound;Neuromuscular re-education;Patient/family education;Manual techniques;Taping;Dry needling;Passive range of motion   PT Next Visit Plan posture education, manual and modalities,  flexibility   Consulted and Agree with Plan of Care Patient        Problem List Patient Active Problem List   Diagnosis Date Noted  . Right rotator cuff tear 12/20/2015  . Low back pain 12/20/2015  . Left knee pain 12/20/2015  . Pleural effusion, left 02/21/2013  . 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 S/P Maze operation for atrial  fibrillation 01/27/2013  . Type II diabetes mellitus ([place]) 01/19/2013  . Hyperlipidemia 01/19/2013  . Obesity (BMI 30-39.9) 01/19/2013  . Hypertension 01/19/2013  . OSA (obstructive sleep apnea) 12/10/2012  . Coronary atherosclerosis of native coronary artery 12/09/2012  . Cardiomyopathy, ischemic 12/09/2012  . Atrial fibrillation ([place]) 10/25/2012    TAKACS,KELLY, PT 01/08/2016, 10:09 AM  [facility] [address] [place], [place], [address] Phone: [phone]   Fax:  [phone]  Name: [name] MRN: [id number] Date of Birth: September 29, 1941

## 2016-01-11 ENCOUNTER — Ambulatory Visit: Payer: Medicare Other | Admitting: Physical Therapy

## 2016-01-11 DIAGNOSIS — M25511 Pain in right shoulder: Secondary | ICD-10-CM

## 2016-01-11 DIAGNOSIS — M545 Low back pain, unspecified: Secondary | ICD-10-CM

## 2016-01-11 DIAGNOSIS — R29898 Other symptoms and signs involving the musculoskeletal system: Secondary | ICD-10-CM

## 2016-01-11 DIAGNOSIS — M25562 Pain in left knee: Secondary | ICD-10-CM

## 2016-01-11 DIAGNOSIS — R293 Abnormal posture: Secondary | ICD-10-CM

## 2016-01-11 NOTE — Patient Instructions (Signed)
   [facility] [address], [address] [place], [place] [address] Phone # [phone] Fax [phone]  

## 2016-01-11 NOTE — Therapy (Signed)
[facility] [address], [place], [place], [address] Phone: [phone]   Fax:  [phone]  Physical Therapy Treatment  Patient Details  Name: [name] MRN: [id number] Date of Birth: 1941/10/05 Referring Provider: Hulan Saas, MD  Encounter Date: 01/11/2016      PT End of Session - 01/11/16 1530    Visit Number 3   Number of Visits 10   Date for PT Re-Evaluation 02/21/16   PT Start Time 0802   PT Stop Time 0900   PT Time Calculation (min) 58 min   Activity Tolerance Patient tolerated treatment well      Past Medical History  Diagnosis Date  . Coronary artery disease   . Hyperlipidemia     takes Zetia daily  . Allergy   . Bronchitis   . Sleep apnea   . Cardiomyopathy, ischemic   . Obesity (BMI 30-39.9)   . Myocardial infarction [facility]) 2008    inferior wall, treated with BMS in RCA  . Complication of anesthesia     pt states he gets "crazy" after anesthesia  . Atrial fibrillation ([place])     persistent, failed DCCV;takes Metoprolol daily and taking Pacerone for 7days prior to surgery  . Hay fever     takes Zyrtec daily  . History of bronchitis     last time a year ago  . History of kidney stones   . Type II diabetes mellitus (HCC)     takes Metformin and Januvia daily;pt states he is not a diabetic but pre diabetic  . 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 S/P Maze operation for atrial fibrillation 01/27/2013    Complete bilateral lesion set using bipolar radiofrequency and cryothermy ablation with clipping of LA appendage  . Pleural effusion, left 02/21/2013    Past Surgical History  Procedure Laterality Date  . Tonsillectomy  1952  . Nasal septum surgery  2004    and sinus repair penta  . Umbilical hernia repair  01/2010  . Hernia repair  12/18/2009    Incarcerated umbilical hernia  . Cardioversion  11/30/2012    Procedure: CARDIOVERSION;  Surgeon:  Sherren Mocha, MD;  Location: [facility] ENDOSCOPY;  Service: Cardiovascular;  Laterality: [name];  . Cystoscopy    . Skin taken off of top of mouth and reapplied to gum    . Coronary angioplasty with stent placement  01/17/13 and in 2008    1 stent placed in 2008  . Coronary artery bypass graft [name] 01/27/2013    Procedure: CORONARY ARTERY BYPASS GRAFTING (CABG) x  two; using left internal mammary artery and right leg greater saphenous vein harvested endoscopically;  Surgeon: Rexene Alberts, MD;  Location: [place];  Service: Open Heart Surgery;  Laterality: [name];  . Maze [name] 01/27/2013    Procedure: MAZE;  Surgeon: Rexene Alberts, MD;  Location: [place];  Service: Open Heart Surgery;  Laterality: [name];  . Intraoperative transesophageal echocardiogram [name] 01/27/2013    Procedure: INTRAOPERATIVE TRANSESOPHAGEAL ECHOCARDIOGRAM;  Surgeon: Rexene Alberts, MD;  Location: [place];  Service: Open Heart Surgery;  Laterality: [name];    There were no vitals filed for this visit.  Visit Diagnosis:  Knee pain, acute, left  Bilateral low back pain without sciatica  Posture abnormality  Pain in joint of right shoulder  Shoulder weakness      Subjective Assessment - 01/11/16 [id number]  Subjective My shoulder pain has subsided some from not using it.   The doctor says it is inoperable.   I'm not sure what to do at the gym.     Currently in Pain? No/denies   Pain Score 0-No pain   Pain Location Shoulder   Pain Score 0   Pain Location Back            [facility] PT Assessment - 01/11/16 0001    AROM   AROM Assessment Site Shoulder   Right/Left Shoulder Right;Left   Right Shoulder Flexion 170 Degrees   Right Shoulder ABduction 165 Degrees   Right Shoulder Internal Rotation --  L5   Right Shoulder External Rotation 23 Degrees   Left Shoulder Flexion 170 Degrees   Left Shoulder ABduction 170 Degrees   Left Shoulder Internal Rotation --  T 8   Left Shoulder External Rotation 53 Degrees   Strength   Strength  Assessment Site Shoulder   Right/Left Shoulder Right;Left   Special Tests    Special Tests Rotator Cuff Impingement   Rotator Cuff Impingment tests Michel Bickers test;Empty Can test;Lag signs at 0 degrees;Drop Arm test   Hawkins-Kennedy test   Findings Positive   Side Left   Empty Can test   Findings Positive   Side Right   Lag time at 0 degrees   Findings Positive   Side Right   Drop Arm test   Findings Positive   Side Right                     [facility] Adult PT Treatment/Exercise - 01/11/16 0001    Shoulder Exercises: Supine   Protraction AROM;Right;10 reps   Shoulder Exercises: Sidelying   External Rotation AROM;Right;10 reps   Shoulder Exercises: Standing   Extension Strengthening;Both;20 reps;Theraband   Theraband Level (Shoulder Extension) Level 3 (Green)   Row Strengthening;Both;20 reps;Theraband   Theraband Level (Shoulder Row) Level 3 (Green)   Moist Heat Therapy   Number Minutes Moist Heat 15 Minutes   Moist Heat Location Shoulder;Lumbar Spine   Electrical Stimulation   Electrical Stimulation Location shoulder right   Electrical Stimulation Action IFC   Electrical Stimulation Parameters 15   Electrical Stimulation Goals Pain                PT Education - 01/11/16 1530    Education provided Yes   Education Details green band rows and shoulder extension   Person(s) Educated Patient   Methods Explanation;Demonstration;Handout   Comprehension Verbalized understanding;Returned demonstration          PT Short Term Goals - 01/11/16 1652    PT SHORT TERM GOAL #1   Title be independent in initial HEP   Time 4   Period Weeks   Status On-going   PT SHORT TERM GOAL #2   Title stand and walk with erect posture and neutral pelvis > or = to 50% of the time   Time 4   Period Weeks   Status New   PT SHORT TERM GOAL #3   Title report a 25% reduction in LBP with change of positions   Time 4   Period Weeks   Status On-going   PT SHORT TERM  GOAL #4   Title report < or = to 3/10 Lt knee pain with standing and walking   Time 4   Period Weeks   Status On-going   PT SHORT TERM GOAL #5   Title verbalize and demonstrate correct body mechanics  with lifting and work tasks to reduce lumbar strain   Time 4   Period Weeks   Status On-going           PT Long Term Goals - 01/11/16 1652    PT LONG TERM GOAL #1   Title be independent in advanced HEP   Time 8   Period Weeks   Status On-going   PT LONG TERM GOAL #2   Title reduce FOTO to < or = to 44% limitation   Time 8   Period Weeks   Status On-going   PT LONG TERM GOAL #3   Title stand erect with neutral pelivs > or = to 75% of the time   Time 8   Period Weeks   Status On-going   PT LONG TERM GOAL #4   Title report a 60% reduction in LBP and knee pain with standing work tasks   Time 8   Period Weeks   Status On-going   PT LONG TERM GOAL #5   Title return to regular gym exercise program with modifications to avoid aggravation to shoulder   Time 8   Period Weeks   Status On-going   PT LONG TERM GOAL #6   Title Right deltoid, biceps, triceps, middle and lower trap strength to 4+/5 needed for moderate lifting below shoulder level   Time 8   Period Weeks   Status New               Plan - 01/11/16 1531    Clinical Impression Statement The patient has a new PT order from Dr. Tamera Punt with diagnosis of "inoperable chronic rotator cuff tear."  Further assessment performed on shoulder (patient previous declined as he awaited his MRI results and ortho appt.)  He has pain with lifting bags of concrete overhead, reaching out to the side and behind his back.  He has pain at night that wakes him up.  AROM is WFLS although painful with endrange movements.  Pain with all resisted movements 4/5 glenohumeral muscles, periscapular muscles 4/5.  Positive drop arm test, Michel Bickers, lag sign.  He would like to return to the gym but is unsure of what to do to avoid worsening  his shoulder.  He would benefit from PT for shoulder strengthening and education on an appropriate gym program in addition to continued treatment for back and knee pain.     PT Treatment/Interventions ADLs/Self Care Home Management;Cryotherapy;Electrical Stimulation;Moist Heat;Therapeutic exercise;Therapeutic activities;Functional mobility training;Ultrasound;Neuromuscular re-education;Patient/family education;Manual techniques;Taping;Dry needling;Passive range of motion;Iontophoresis 4mg /ml Dexamethasone   PT Next Visit Plan posture education, core strengthening;   flexibility; strengthen right periscapular muscles, deltoids and triceps;  education on avoidance of overhead lifting;  IFC/heat for pain control ;  send request to add ionto to plan of care    By signing I understand that I am ordering/authorizing the use of Iontophoresis using 4 mg/mL of dexamethasone as a component of this plan of care.    Problem List Patient Active Problem List   Diagnosis Date Noted  . Right rotator cuff tear 12/20/2015  . Low back pain 12/20/2015  . Left knee pain 12/20/2015  . Pleural effusion, left 02/21/2013  . 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 S/P Maze operation for atrial fibrillation 01/27/2013  . Type II diabetes mellitus ([place]) 01/19/2013  . Hyperlipidemia 01/19/2013  . Obesity (BMI 30-39.9) 01/19/2013  . Hypertension 01/19/2013  . OSA (obstructive sleep apnea) 12/10/2012  . Coronary atherosclerosis of native coronary artery 12/09/2012  .  Cardiomyopathy, ischemic 12/09/2012  . Atrial fibrillation ([place]) 10/25/2012   Ruben Im, PT 01/11/2016 4:57 PM Phone: [phone] Fax: [phone] Alvera Singh 01/11/2016, 4:56 PM  [facility] [address] [place], [place], [address] Phone: [phone]   Fax:  [phone]  Name: [name] MRN: [id number] Date of Birth: [birth year]

## 2016-01-17 ENCOUNTER — Ambulatory Visit: Payer: Medicare Other

## 2016-01-17 DIAGNOSIS — R29898 Other symptoms and signs involving the musculoskeletal system: Secondary | ICD-10-CM

## 2016-01-17 DIAGNOSIS — M25562 Pain in left knee: Secondary | ICD-10-CM | POA: Diagnosis not present

## 2016-01-17 DIAGNOSIS — M545 Low back pain, unspecified: Secondary | ICD-10-CM

## 2016-01-17 DIAGNOSIS — M25511 Pain in right shoulder: Secondary | ICD-10-CM

## 2016-01-17 DIAGNOSIS — R293 Abnormal posture: Secondary | ICD-10-CM

## 2016-01-17 NOTE — Therapy (Signed)
[facility] [address], [place], [place], [address] Phone: [phone]   Fax:  [phone]  Physical Therapy Treatment  Patient Details  Name: [name] MRN: [id number] Date of Birth: [birth year] Referring Provider: Hulan Saas, MD  Encounter Date: 01/17/2016      PT End of Session - 01/17/16 1007    Visit Number 4   Number of Visits 10   Date for PT Re-Evaluation 02/21/16   PT Start Time 0931   PT Stop Time 1025   PT Time Calculation (min) 54 min   Activity Tolerance Patient tolerated treatment well   Behavior During Therapy [facility] for tasks assessed/performed      Past Medical History  Diagnosis Date  . Coronary artery disease   . Hyperlipidemia     takes Zetia daily  . Allergy   . Bronchitis   . Sleep apnea   . Cardiomyopathy, ischemic   . Obesity (BMI 30-39.9)   . Myocardial infarction [facility]) 2008    inferior wall, treated with BMS in RCA  . Complication of anesthesia     pt states he gets "crazy" after anesthesia  . Atrial fibrillation ([place])     persistent, failed DCCV;takes Metoprolol daily and taking Pacerone for 7days prior to surgery  . Hay fever     takes Zyrtec daily  . History of bronchitis     last time a year ago  . History of kidney stones   . Type II diabetes mellitus (HCC)     takes Metformin and Januvia daily;pt states he is not a diabetic but pre diabetic  . 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 S/P Maze operation for atrial fibrillation 01/27/2013    Complete bilateral lesion set using bipolar radiofrequency and cryothermy ablation with clipping of LA appendage  . Pleural effusion, left 02/21/2013    Past Surgical History  Procedure Laterality Date  . Tonsillectomy  1952  . Nasal septum surgery  2004    and sinus repair penta  . Umbilical hernia repair  01/2010  . Hernia repair  12/18/2009    Incarcerated umbilical hernia  .  Cardioversion  11/30/2012    Procedure: CARDIOVERSION;  Surgeon: Sherren Mocha, MD;  Location: [facility] ENDOSCOPY;  Service: Cardiovascular;  Laterality: [name];  . Cystoscopy    . Skin taken off of top of mouth and reapplied to gum    . Coronary angioplasty with stent placement  01/17/13 and in 2008    1 stent placed in 2008  . Coronary artery bypass graft [name] 01/27/2013    Procedure: CORONARY ARTERY BYPASS GRAFTING (CABG) x  two; using left internal mammary artery and right leg greater saphenous vein harvested endoscopically;  Surgeon: Rexene Alberts, MD;  Location: [place];  Service: Open Heart Surgery;  Laterality: [name];  . Maze [name] 01/27/2013    Procedure: MAZE;  Surgeon: Rexene Alberts, MD;  Location: [place];  Service: Open Heart Surgery;  Laterality: [name];  . Intraoperative transesophageal echocardiogram [name] 01/27/2013    Procedure: INTRAOPERATIVE TRANSESOPHAGEAL ECHOCARDIOGRAM;  Surgeon: Rexene Alberts, MD;  Location: [place];  Service: Open Heart Surgery;  Laterality: [name];    There were no vitals filed for this visit.  Visit Diagnosis:  Bilateral low back pain without sciatica  Posture abnormality  Pain in joint of right shoulder  Shoulder weakness      Subjective Assessment - 01/17/16  0935    Subjective No knee pain.  Pt has been back to the gym.     Currently in Pain? Yes   Pain Score 3    Pain Location Shoulder   Pain Orientation Right   Pain Descriptors / Indicators Aching;Sharp   Pain Type Chronic pain   Pain Onset More than a month ago   Pain Frequency Constant   Aggravating Factors  midrange Rt shoulder flexion, use of Rt UE   Pain Relieving Factors rest, heat                         [facility] Adult PT Treatment/Exercise - 01/17/16 0001    Exercises   Exercises Shoulder   Shoulder Exercises: Sidelying   External Rotation AROM;Right;20 reps   ABduction Right;20 reps   Shoulder Exercises: Standing   External Rotation Strengthening;Right;Theraband;20 reps    Theraband Level (Shoulder Internal Rotation) Level 1 (Yellow)  Rt shoulder fatigue with this   Flexion Right;10 reps  using finger ladder   ABduction Strengthening;Right;10 reps  using finger ladder   Extension Strengthening;Both;20 reps;Theraband   Theraband Level (Shoulder Extension) Level 3 (Green)   Row Strengthening;Both;20 reps;Theraband   Theraband Level (Shoulder Row) Level 3 (Green)   Other Standing Exercises 3 way raises: 0 weight 1x10   Shoulder Exercises: ROM/Strengthening   UBE (Upper Arm Bike) Level 1x 6 minutes (3/3)   Modalities   Modalities Electrical Stimulation   Moist Heat Therapy   Number Minutes Moist Heat 15 Minutes   Moist Heat Location Shoulder   Electrical Stimulation   Electrical Stimulation Location shoulder right   Electrical Stimulation Action IFC   Electrical Stimulation Parameters 15   Electrical Stimulation Goals Pain                  PT Short Term Goals - 01/17/16 1008    PT SHORT TERM GOAL #1   Title be independent in initial HEP   Status Achieved   PT SHORT TERM GOAL #2   Title stand and walk with erect posture and neutral pelvis > or = to 50% of the time   Status Achieved   PT SHORT TERM GOAL #3   Title report a 25% reduction in LBP with change of positions   Status Achieved   PT SHORT TERM GOAL #4   Title report < or = to 3/10 Lt knee pain with standing and walking   Status Achieved   PT SHORT TERM GOAL #5   Title verbalize and demonstrate correct body mechanics with lifting and work tasks to reduce lumbar strain   Time 4   Period Weeks   Status On-going           PT Long Term Goals - 01/11/16 1652    PT LONG TERM GOAL #1   Title be independent in advanced HEP   Time 8   Period Weeks   Status On-going   PT LONG TERM GOAL #2   Title reduce FOTO to < or = to 44% limitation   Time 8   Period Weeks   Status On-going   PT LONG TERM GOAL #3   Title stand erect with neutral pelivs > or = to 75% of the time   Time  8   Period Weeks   Status On-going   PT LONG TERM GOAL #4   Title report a 60% reduction in LBP and knee pain with standing work tasks   Time  8   Period Weeks   Status On-going   PT LONG TERM GOAL #5   Title return to regular gym exercise program with modifications to avoid aggravation to shoulder   Time 8   Period Weeks   Status On-going   PT LONG TERM GOAL #6   Title Right deltoid, biceps, triceps, middle and lower trap strength to 4+/5 needed for moderate lifting below shoulder level   Time 8   Period Weeks   Status New               Plan - 01/17/16 [id number]    Clinical Impression Statement Pt with continued Rt shoulder pain and weakness due to rotator cuff tear.  Pt has pain with lifting heavy objects and reaching behind the back.  Pt able to tolerate all activity in the clinic without difficulty today.  Pt requires tactile and verbal cues to reduce substitution.  Pt has received verbal education regarding overuse of the Rt shoulder.  Pt without signiciant LBP or knee pain today so emphasis was on shoulder treatment.  Pt will continue to benefit from skilled PT for safe Rt shoulder strength progression and treatment of LBP and knee pain as tolerated.     Pt will benefit from skilled therapeutic intervention in order to improve on the following deficits Abnormal gait;Decreased range of motion;Difficulty walking;Pain;Postural dysfunction;Decreased mobility;Impaired flexibility;Decreased activity tolerance;Increased muscle spasms   Rehab Potential Good   PT Frequency 2x / week   PT Duration 8 weeks   PT Treatment/Interventions ADLs/Self Care Home Management;Cryotherapy;Electrical Stimulation;Moist Heat;Therapeutic exercise;Therapeutic activities;Functional mobility training;Ultrasound;Neuromuscular re-education;Patient/family education;Manual techniques;Taping;Dry needling;Passive range of motion;Iontophoresis 4mg /ml Dexamethasone   PT Next Visit Plan Rt shoulder strength and AROM,  modalities as needed.  Treat LBP and knee pain as needed.     Consulted and Agree with Plan of Care Patient        Problem List Patient Active Problem List   Diagnosis Date Noted  . Right rotator cuff tear 12/20/2015  . Low back pain 12/20/2015  . Left knee pain 12/20/2015  . Pleural effusion, left 02/21/2013  . 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 S/P Maze operation for atrial fibrillation 01/27/2013  . Type II diabetes mellitus ([place]) 01/19/2013  . Hyperlipidemia 01/19/2013  . Obesity (BMI 30-39.9) 01/19/2013  . Hypertension 01/19/2013  . OSA (obstructive sleep apnea) 12/10/2012  . Coronary atherosclerosis of native coronary artery 12/09/2012  . Cardiomyopathy, ischemic 12/09/2012  . Atrial fibrillation ([place]) 10/25/2012    TAKACS,KELLY, PT 01/17/2016, 10:12 AM  [facility] [address], [place], [place], [address] Phone: [phone]   Fax:  [phone]  Name: ALVESTER KNOEDLER MRN: [id number] Date of Birth: [birth year]

## 2016-01-18 ENCOUNTER — Ambulatory Visit: Payer: Medicare Other | Admitting: Physical Therapy

## 2016-01-18 DIAGNOSIS — R293 Abnormal posture: Secondary | ICD-10-CM

## 2016-01-18 DIAGNOSIS — M25562 Pain in left knee: Secondary | ICD-10-CM | POA: Diagnosis not present

## 2016-01-18 DIAGNOSIS — R29898 Other symptoms and signs involving the musculoskeletal system: Secondary | ICD-10-CM

## 2016-01-18 DIAGNOSIS — M25511 Pain in right shoulder: Secondary | ICD-10-CM

## 2016-01-18 NOTE — Therapy (Signed)
[facility] [address] [place], [place], [address] Phone: [phone]   Fax:  [phone]  Physical Therapy Treatment  Patient Details  Name: [name] MRN: [id number] Date of Birth: Apr 07, 1941 Referring Provider: Hulan Saas, MD  Encounter Date: 01/18/2016      PT End of Session - 01/18/16 1048    Visit Number (p) 5   Number of Visits (p) 10   Date for PT Re-Evaluation (p) 02/21/16   Authorization Type (p) Medicare   PT Start Time (p) 1015   PT Stop Time (p) 1105   PT Time Calculation (min) (p) 50 min      Past Medical History  Diagnosis Date  . Coronary artery disease   . Hyperlipidemia     takes Zetia daily  . Allergy   . Bronchitis   . Sleep apnea   . Cardiomyopathy, ischemic   . Obesity (BMI 30-39.9)   . Myocardial infarction [facility]) 2008    inferior wall, treated with BMS in RCA  . Complication of anesthesia     pt states he gets "crazy" after anesthesia  . Atrial fibrillation ([place])     persistent, failed DCCV;takes Metoprolol daily and taking Pacerone for 7days prior to surgery  . Hay fever     takes Zyrtec daily  . History of bronchitis     last time a year ago  . History of kidney stones   . Type II diabetes mellitus (HCC)     takes Metformin and Januvia daily;pt states he is not a diabetic but pre diabetic  . 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 S/P Maze operation for atrial fibrillation 01/27/2013    Complete bilateral lesion set using bipolar radiofrequency and cryothermy ablation with clipping of LA appendage  . Pleural effusion, left 02/21/2013    Past Surgical History  Procedure Laterality Date  . Tonsillectomy  1952  . Nasal septum surgery  2004    and sinus repair penta  . Umbilical hernia repair  01/2010  . Hernia repair  12/18/2009    Incarcerated umbilical hernia  . Cardioversion  11/30/2012    Procedure: CARDIOVERSION;   Surgeon: Sherren Mocha, MD;  Location: [facility] ENDOSCOPY;  Service: Cardiovascular;  Laterality: [name];  . Cystoscopy    . Skin taken off of top of mouth and reapplied to gum    . Coronary angioplasty with stent placement  01/17/13 and in 2008    1 stent placed in 2008  . Coronary artery bypass graft [name] 01/27/2013    Procedure: CORONARY ARTERY BYPASS GRAFTING (CABG) x  two; using left internal mammary artery and right leg greater saphenous vein harvested endoscopically;  Surgeon: Rexene Alberts, MD;  Location: [place];  Service: Open Heart Surgery;  Laterality: [name];  . Maze [name] 01/27/2013    Procedure: MAZE;  Surgeon: Rexene Alberts, MD;  Location: [place];  Service: Open Heart Surgery;  Laterality: [name];  . Intraoperative transesophageal echocardiogram [name] 01/27/2013    Procedure: INTRAOPERATIVE TRANSESOPHAGEAL ECHOCARDIOGRAM;  Surgeon: Rexene Alberts, MD;  Location: [place];  Service: Open Heart Surgery;  Laterality: [name];    There were no vitals filed for this visit.  Visit Diagnosis:  Posture abnormality  Pain in joint of right shoulder  Shoulder weakness      Subjective Assessment - 01/18/16 1017    Subjective No soreness after last visit.  Minor  shoulder pain with reaching quickly 3-5/10.  I worked my legs this morning at the gym.     Currently in Pain? Yes   Pain Score 3    Pain Location Shoulder   Pain Orientation Right   Pain Onset More than a month ago   Pain Frequency Constant   Aggravating Factors  reaching suddenly                         [facility] Adult PT Treatment/Exercise - 01/18/16 0001    Shoulder Exercises: Supine   Protraction Strengthening;Right;20 reps;Weights   Protraction Weight (lbs) 4   Shoulder Exercises: Prone   Extension Strengthening;Right;15 reps   Horizontal ABduction 1 Strengthening;Right;15 reps   Horizontal ABduction 2 Strengthening;Right;15 reps   Shoulder Exercises: Standing   Extension Strengthening;Both;20 reps;Theraband   Theraband  Level (Shoulder Extension) Level 3 (Green)   Row Strengthening;Both;20 reps;Theraband   Theraband Level (Shoulder Row) Level 3 (Green)   Shoulder Exercises: ROM/Strengthening   UBE (Upper Arm Bike) Level 1x 6 minutes (3/3)   Shoulder Exercises: Isometric Strengthening   Flexion 5X5"   External Rotation 5X5"   Internal Rotation 5X5"   ABduction 5X5"   Moist Heat Therapy   Number Minutes Moist Heat 15 Minutes   Moist Heat Location Shoulder   Electrical Stimulation   Electrical Stimulation Location shoulder   Electrical Stimulation Action IFC   Electrical Stimulation Parameters 15   Electrical Stimulation Goals Pain                  PT Short Term Goals - 01/18/16 1117    PT SHORT TERM GOAL #1   Title be independent in initial HEP   Status Achieved   PT SHORT TERM GOAL #2   Title stand and walk with erect posture and neutral pelvis > or = to 50% of the time   Status Achieved   PT SHORT TERM GOAL #3   Title report a 25% reduction in LBP with change of positions   Status Achieved   PT SHORT TERM GOAL #4   Title report < or = to 3/10 Lt knee pain with standing and walking   Status Achieved   PT SHORT TERM GOAL #5   Title verbalize and demonstrate correct body mechanics with lifting and work tasks to reduce lumbar strain   Time 4   Period Weeks   Status On-going           PT Long Term Goals - 01/18/16 1117    PT LONG TERM GOAL #1   Title be independent in advanced HEP   Time 8   Period Weeks   Status On-going   PT LONG TERM GOAL #2   Title reduce FOTO to < or = to 44% limitation   Time 8   Period Weeks   Status On-going   PT LONG TERM GOAL #3   Title stand erect with neutral pelivs > or = to 75% of the time   Time 8   Period Weeks   Status On-going   PT LONG TERM GOAL #4   Title report a 60% reduction in LBP and knee pain with standing work tasks   Time 8   Period Weeks   Status On-going   PT LONG TERM GOAL #5   Title return to regular gym exercise  program with modifications to avoid aggravation to shoulder   Time 8   Period Weeks   Status On-going  PT LONG TERM GOAL #6   Title Right deltoid, biceps, triceps, middle and lower trap strength to 4+/5 needed for moderate lifting below shoulder level   Time 8   Period Weeks   Status On-going               Plan - 01/18/16 1107    Clinical Impression Statement The patient is able to perform deltoid strengthening exercises with some minor modifications to avoid exacerbation to rotator cuff pain.   No complaints of back and shoulder pain so treatment focus on shoulder.  Pain primarily posterior right shoulder in infraspinatus and supraspinatus muscles.     PT Next Visit Plan Rt shoulder strength and AROM, e-stim/heat as needed.  Treat LBP and knee pain as needed;  review body mechanics with lifting per STG        Problem List Patient Active Problem List   Diagnosis Date Noted  . Right rotator cuff tear 12/20/2015  . Low back pain 12/20/2015  . Left knee pain 12/20/2015  . Pleural effusion, left 02/21/2013  . 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 S/P Maze operation for atrial fibrillation 01/27/2013  . Type II diabetes mellitus ([place]) 01/19/2013  . Hyperlipidemia 01/19/2013  . Obesity (BMI 30-39.9) 01/19/2013  . Hypertension 01/19/2013  . OSA (obstructive sleep apnea) 12/10/2012  . Coronary atherosclerosis of native coronary artery 12/09/2012  . Cardiomyopathy, ischemic 12/09/2012  . Atrial fibrillation ([place]) 10/25/2012    Alvera Singh 01/18/2016, 11:21 AM  [facility] [address], [place], [place], [address] Phone: [phone]   Fax:  [phone]  Name: [name] MRN: [id number] Date of Birth: 1941/11/03    Ruben Im, PT 01/18/2016 11:21 AM Phone: [phone] Fax: [phone]

## 2016-01-22 ENCOUNTER — Ambulatory Visit: Payer: Medicare Other | Admitting: Physical Therapy

## 2016-01-22 DIAGNOSIS — R29898 Other symptoms and signs involving the musculoskeletal system: Secondary | ICD-10-CM

## 2016-01-22 DIAGNOSIS — R293 Abnormal posture: Secondary | ICD-10-CM

## 2016-01-22 DIAGNOSIS — M25511 Pain in right shoulder: Secondary | ICD-10-CM

## 2016-01-22 DIAGNOSIS — M25562 Pain in left knee: Secondary | ICD-10-CM | POA: Diagnosis not present

## 2016-01-22 NOTE — Therapy (Signed)
[facility] [address] [place], [place], [address] Phone: [phone]   Fax:  [phone]  Physical Therapy Treatment  Patient Details  Name: [name] MRN: [id number] Date of Birth: 1941/04/24 Referring Provider: Hulan Saas, MD  Encounter Date: 01/22/2016      PT End of Session - 01/22/16 0821    Visit Number 5   Number of Visits 10   Date for PT Re-Evaluation 02/21/16   PT Start Time 0740   PT Stop Time 0833   PT Time Calculation (min) 53 min   Activity Tolerance Patient tolerated treatment well      Past Medical History  Diagnosis Date  . Coronary artery disease   . Hyperlipidemia     takes Zetia daily  . Allergy   . Bronchitis   . Sleep apnea   . Cardiomyopathy, ischemic   . Obesity (BMI 30-39.9)   . Myocardial infarction [facility]) 2008    inferior wall, treated with BMS in RCA  . Complication of anesthesia     pt states he gets "crazy" after anesthesia  . Atrial fibrillation ([place])     persistent, failed DCCV;takes Metoprolol daily and taking Pacerone for 7days prior to surgery  . Hay fever     takes Zyrtec daily  . History of bronchitis     last time a year ago  . History of kidney stones   . Type II diabetes mellitus (HCC)     takes Metformin and Januvia daily;pt states he is not a diabetic but pre diabetic  . 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 S/P Maze operation for atrial fibrillation 01/27/2013    Complete bilateral lesion set using bipolar radiofrequency and cryothermy ablation with clipping of LA appendage  . Pleural effusion, left 02/21/2013    Past Surgical History  Procedure Laterality Date  . Tonsillectomy  1952  . Nasal septum surgery  2004    and sinus repair penta  . Umbilical hernia repair  01/2010  . Hernia repair  12/18/2009    Incarcerated umbilical hernia  . Cardioversion  11/30/2012    Procedure: CARDIOVERSION;  Surgeon:  Sherren Mocha, MD;  Location: [facility] ENDOSCOPY;  Service: Cardiovascular;  Laterality: [name];  . Cystoscopy    . Skin taken off of top of mouth and reapplied to gum    . Coronary angioplasty with stent placement  01/17/13 and in 2008    1 stent placed in 2008  . Coronary artery bypass graft [name] 01/27/2013    Procedure: CORONARY ARTERY BYPASS GRAFTING (CABG) x  two; using left internal mammary artery and right leg greater saphenous vein harvested endoscopically;  Surgeon: Rexene Alberts, MD;  Location: [place];  Service: Open Heart Surgery;  Laterality: [name];  . Maze [name] 01/27/2013    Procedure: MAZE;  Surgeon: Rexene Alberts, MD;  Location: [place];  Service: Open Heart Surgery;  Laterality: [name];  . Intraoperative transesophageal echocardiogram [name] 01/27/2013    Procedure: INTRAOPERATIVE TRANSESOPHAGEAL ECHOCARDIOGRAM;  Surgeon: Rexene Alberts, MD;  Location: [place];  Service: Open Heart Surgery;  Laterality: [name];    There were no vitals filed for this visit.  Visit Diagnosis:  Posture abnormality  Pain in joint of right shoulder  Shoulder weakness      Subjective Assessment - 01/22/16 0741    Subjective My knees will pop ever once in a while but only  hurts if I were to bump it.  My entire shoulder is sore for whatever reason and feels that should be the primary focus of PT treatment.    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     Currently in Pain? Yes   Pain Score 3    Pain Location Shoulder   Pain Orientation Right   Pain Onset More than a month ago   Pain Frequency Constant   Pain Score 0   Pain Location Back                         [facility] Adult PT Treatment/Exercise - 01/22/16 0001    Shoulder Exercises: Supine   Protraction Strengthening;Right;20 reps;Weights   Protraction Weight (lbs) 4   Other Supine Exercises bent arm raises 2# 2x10   Shoulder Exercises: Prone   Extension Strengthening;Right;20 reps;Weights   Extension Weight (lbs) 2   Other Prone Exercises  rows 2# 20x   Shoulder Exercises: Sidelying   External Rotation Strengthening;Right;Weights   External Rotation Weight (lbs) 2   Shoulder Exercises: Standing   Row Strengthening;Right;20 reps   Theraband Level (Shoulder Row) Level 4 (Blue)   Other Standing Exercises wall push ups 10x  2 ways 10x each   Other Standing Exercises bicep curls blue band 20x   Moist Heat Therapy   Number Minutes Moist Heat 15 Minutes   Moist Heat Location Shoulder   Electrical Stimulation   Electrical Stimulation Location shoulder   Electrical Stimulation Action IFC   Electrical Stimulation Parameters 15 19 ma   Electrical Stimulation Goals Pain                  PT Short Term Goals - 01/22/16 9702    PT SHORT TERM GOAL #1   Title be independent in initial HEP   Status Achieved   PT SHORT TERM GOAL #2   Title stand and walk with erect posture and neutral pelvis > or = to 50% of the time   Status Achieved   PT SHORT TERM GOAL #3   Title report a 25% reduction in LBP with change of positions   Status Achieved   PT SHORT TERM GOAL #4   Title report < or = to 3/10 Lt knee pain with standing and walking   Status Achieved   PT SHORT TERM GOAL #5   Title verbalize and demonstrate correct body mechanics with lifting and work tasks to reduce lumbar strain   Status Achieved           PT Long Term Goals - 01/22/16 6378    PT LONG TERM GOAL #1   Title be independent in advanced HEP   Time 8   Period Weeks   Status On-going   PT LONG TERM GOAL #2   Title reduce FOTO to < or = to 44% limitation   Time 8   Period Weeks   Status On-going   PT LONG TERM GOAL #3   Title stand erect with neutral pelivs > or = to 75% of the time   Time 8   Period Weeks   Status On-going   PT LONG TERM GOAL #4   Title report a 60% reduction in LBP and knee pain with standing work tasks   Time 8   Period Weeks   Status On-going   PT LONG TERM GOAL #5   Title return to regular gym exercise program with  modifications to avoid aggravation  to shoulder   Time 8   Period Weeks   Status On-going   PT LONG TERM GOAL #6   Title Right deltoid, biceps, triceps, middle and lower trap strength to 4+/5 needed for moderate lifting below shoulder level   Time 8   Period Weeks   Status On-going               Plan - 01/22/16 7544    Clinical Impression Statement Patient able to participate in deltoid, triceps and biceps strengthening exercises as well as small arc ER.  He needs close supervision to avoid "pushing through" the pain.    Verbal and tactile cues to keep ER in a small arc motion and to avoid endrange overhead motions which aggravate pain.  Verbally reviewed body mechanics with lifiing.  Patient states he is aware of proper technique but is unable to do it the proper way all of the time.  All STGs met.  Treatment focus continues to be on shoulder per patient request.     PT Next Visit Plan Rt shoulder strength and AROM, e-stim/heat as needed.  Treat LBP and knee pain as needed        Problem List Patient Active Problem List   Diagnosis Date Noted  . Right rotator cuff tear 12/20/2015  . Low back pain 12/20/2015  . Left knee pain 12/20/2015  . Pleural effusion, left 02/21/2013  . 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 S/P Maze operation for atrial fibrillation 01/27/2013  . Type II diabetes mellitus ([place]) 01/19/2013  . Hyperlipidemia 01/19/2013  . Obesity (BMI 30-39.9) 01/19/2013  . Hypertension 01/19/2013  . OSA (obstructive sleep apnea) 12/10/2012  . Coronary atherosclerosis of native coronary artery 12/09/2012  . Cardiomyopathy, ischemic 12/09/2012  . Atrial fibrillation ([place]) 10/25/2012    Alvera Singh 01/22/2016, 8:29 AM  [facility] [address] [place], [place], [address] Phone: [phone]   Fax:  [phone]  Name: [name] MRN: [id number] Date of Birth: 1941-05-31    Ruben Im,  PT 01/22/2016 8:30 AM Phone: [phone] Fax: [phone]

## 2016-01-24 ENCOUNTER — Encounter: Payer: Medicare Other | Admitting: Physical Therapy

## 2016-01-25 ENCOUNTER — Encounter: Payer: Self-pay | Admitting: Physical Therapy

## 2016-01-25 ENCOUNTER — Ambulatory Visit: Payer: Medicare Other | Admitting: Physical Therapy

## 2016-01-25 DIAGNOSIS — R293 Abnormal posture: Secondary | ICD-10-CM

## 2016-01-25 DIAGNOSIS — M25562 Pain in left knee: Secondary | ICD-10-CM | POA: Diagnosis not present

## 2016-01-25 DIAGNOSIS — R29898 Other symptoms and signs involving the musculoskeletal system: Secondary | ICD-10-CM

## 2016-01-25 DIAGNOSIS — M25511 Pain in right shoulder: Secondary | ICD-10-CM

## 2016-01-25 NOTE — Therapy (Signed)
[facility] [address] [place], [place], [address] Phone: [phone]   Fax:  [phone]  Physical Therapy Treatment  Patient Details  Name: [name] MRN: [id number] Date of Birth: 1941/08/14 Referring Provider: Hulan Saas, MD  Encounter Date: 01/25/2016      PT End of Session - 01/25/16 1059    Visit Number 6   Number of Visits 10  Medicare   Authorization Type KX modifier after 15; G-code   PT Start Time 1100   PT Stop Time 1150   PT Time Calculation (min) 50 min   Activity Tolerance Patient tolerated treatment well   Behavior During Therapy [facility] for tasks assessed/performed      Past Medical History  Diagnosis Date  . Coronary artery disease   . Hyperlipidemia     takes Zetia daily  . Allergy   . Bronchitis   . Sleep apnea   . Cardiomyopathy, ischemic   . Obesity (BMI 30-39.9)   . Myocardial infarction [facility]) 2008    inferior wall, treated with BMS in RCA  . Complication of anesthesia     pt states he gets "crazy" after anesthesia  . Atrial fibrillation ([place])     persistent, failed DCCV;takes Metoprolol daily and taking Pacerone for 7days prior to surgery  . Hay fever     takes Zyrtec daily  . History of bronchitis     last time a year ago  . History of kidney stones   . Type II diabetes mellitus (HCC)     takes Metformin and Januvia daily;pt states he is not a diabetic but pre diabetic  . 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 S/P Maze operation for atrial fibrillation 01/27/2013    Complete bilateral lesion set using bipolar radiofrequency and cryothermy ablation with clipping of LA appendage  . Pleural effusion, left 02/21/2013    Past Surgical History  Procedure Laterality Date  . Tonsillectomy  1952  . Nasal septum surgery  2004    and sinus repair penta  . Umbilical hernia repair  01/2010  . Hernia repair  12/18/2009    Incarcerated  umbilical hernia  . Cardioversion  11/30/2012    Procedure: CARDIOVERSION;  Surgeon: Sherren Mocha, MD;  Location: [facility] ENDOSCOPY;  Service: Cardiovascular;  Laterality: [name];  . Cystoscopy    . Skin taken off of top of mouth and reapplied to gum    . Coronary angioplasty with stent placement  01/17/13 and in 2008    1 stent placed in 2008  . Coronary artery bypass graft [name] 01/27/2013    Procedure: CORONARY ARTERY BYPASS GRAFTING (CABG) x  two; using left internal mammary artery and right leg greater saphenous vein harvested endoscopically;  Surgeon: Rexene Alberts, MD;  Location: [place];  Service: Open Heart Surgery;  Laterality: [name];  . Maze [name] 01/27/2013    Procedure: MAZE;  Surgeon: Rexene Alberts, MD;  Location: [place];  Service: Open Heart Surgery;  Laterality: [name];  . Intraoperative transesophageal echocardiogram [name] 01/27/2013    Procedure: INTRAOPERATIVE TRANSESOPHAGEAL ECHOCARDIOGRAM;  Surgeon: Rexene Alberts, MD;  Location: [place];  Service: Open Heart Surgery;  Laterality: [name];    There were no vitals filed for this visit.  Visit Diagnosis:  Pain in joint of right shoulder  Shoulder weakness  Posture abnormality      Subjective Assessment - 01/25/16 1101  Subjective I was walking with my grandson 2 miles and had a pain in left buttocks.  Shoulder is my primary target. My shoulder is getting stronger.    Limitations Sitting;Standing;Walking   How long can you sit comfortably? sitting with driving is uncomfortable   How long can you stand comfortably? 10 minutes   How long can you walk comfortably? 20 minutes   Diagnostic tests x-ray of Rt shoulder: narrowing of joint, [place] in MD office: RTC tear.     Patient Stated Goals reduce LBP and Lt knee pain, drive with less pain, stand and walk without limitation   Currently in Pain? Yes   Pain Score 6    Pain Location Shoulder   Pain Orientation Right   Pain Descriptors / Indicators Aching;Sharp   Pain Type Chronic pain   Pain  Onset More than a month ago   Pain Frequency Constant   Aggravating Factors  reaching    Pain Relieving Factors rest ,heat   Multiple Pain Sites No                         [facility] Adult PT Treatment/Exercise - 01/25/16 0001    Shoulder Exercises: Supine   Protraction Strengthening;Right;20 reps;Weights   Protraction Weight (lbs) 4   Other Supine Exercises bent arm raises 2# 2x10   Shoulder Exercises: Sidelying   External Rotation Strengthening;Right;Weights   External Rotation Weight (lbs) 2   Shoulder Exercises: Standing   Extension Strengthening;Both;20 reps;Theraband   Theraband Level (Shoulder Extension) Level 4 (Blue)   Row Strengthening;Right;20 reps   Theraband Level (Shoulder Row) Level 4 (Blue)   Other Standing Exercises wall push ups 10x  2 ways 10x each   Other Standing Exercises bicep curls blue band 20x   Modalities   Modalities Electrical Stimulation;Moist Heat   Moist Heat Therapy   Number Minutes Moist Heat 15 Minutes   Moist Heat Location Shoulder   Electrical Stimulation   Electrical Stimulation Location shoulder   Electrical Stimulation Action IFC   Electrical Stimulation Parameters to patient tolerance, 15 min   Electrical Stimulation Goals Pain   Manual Therapy   Manual Therapy Soft tissue mobilization   Soft tissue mobilization anterior right shoulder and A-C joint                PT Education - 01/25/16 1141    Education provided No          PT Short Term Goals - 01/22/16 0828    PT SHORT TERM GOAL #1   Title be independent in initial HEP   Status Achieved   PT SHORT TERM GOAL #2   Title stand and walk with erect posture and neutral pelvis > or = to 50% of the time   Status Achieved   PT SHORT TERM GOAL #3   Title report a 25% reduction in LBP with change of positions   Status Achieved   PT SHORT TERM GOAL #4   Title report < or = to 3/10 Lt knee pain with standing and walking   Status Achieved   PT SHORT TERM  GOAL #5   Title verbalize and demonstrate correct body mechanics with lifting and work tasks to reduce lumbar strain   Status Achieved           PT Long Term Goals - 01/22/16 [id number]    PT LONG TERM GOAL #1   Title be independent in advanced HEP   Time 8  Period Weeks   Status On-going   PT LONG TERM GOAL #2   Title reduce FOTO to < or = to 44% limitation   Time 8   Period Weeks   Status On-going   PT LONG TERM GOAL #3   Title stand erect with neutral pelivs > or = to 75% of the time   Time 8   Period Weeks   Status On-going   PT LONG TERM GOAL #4   Title report a 60% reduction in LBP and knee pain with standing work tasks   Time 8   Period Weeks   Status On-going   PT LONG TERM GOAL #5   Title return to regular gym exercise program with modifications to avoid aggravation to shoulder   Time 8   Period Weeks   Status On-going   PT LONG TERM GOAL #6   Title Right deltoid, biceps, triceps, middle and lower trap strength to 4+/5 needed for moderate lifting below shoulder level   Time 8   Period Weeks   Status On-going               Plan - 01/25/16 1141    Clinical Impression Statement Patient has needs verbal cues to not work through pain.  Patient able to perform exericses without difficulty.  Patient just wanted to focus on his shoulder.  Patient would benefit from physical therapy to improve strength, reduce pain and return to function.    Pt will benefit from skilled therapeutic intervention in order to improve on the following deficits Abnormal gait;Decreased range of motion;Difficulty walking;Pain;Postural dysfunction;Decreased mobility;Impaired flexibility;Decreased activity tolerance;Increased muscle spasms   Rehab Potential Good   Clinical Impairments Affecting Rehab Potential None   PT Frequency 2x / week   PT Duration 8 weeks   PT Treatment/Interventions ADLs/Self Care Home Management;Cryotherapy;Electrical Stimulation;Moist Heat;Therapeutic  exercise;Therapeutic activities;Functional mobility training;Ultrasound;Neuromuscular re-education;Patient/family education;Manual techniques;Taping;Dry needling;Passive range of motion;Iontophoresis 4mg /ml Dexamethasone   PT Next Visit Plan Rt shoulder strength and AROM, e-stim/heat as needed.  Treat LBP and knee pain as needed   PT Home Exercise Plan progress as needed   Recommended Other Services None   Consulted and Agree with Plan of Care Patient        Problem List Patient Active Problem List   Diagnosis Date Noted  . Right rotator cuff tear 12/20/2015  . Low back pain 12/20/2015  . Left knee pain 12/20/2015  . Pleural effusion, left 02/21/2013  . 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 S/P Maze operation for atrial fibrillation 01/27/2013  . Type II diabetes mellitus ([place]) 01/19/2013  . Hyperlipidemia 01/19/2013  . Obesity (BMI 30-39.9) 01/19/2013  . Hypertension 01/19/2013  . OSA (obstructive sleep apnea) 12/10/2012  . Coronary atherosclerosis of native coronary artery 12/09/2012  . Cardiomyopathy, ischemic 12/09/2012  . Atrial fibrillation ([place]) 10/25/2012    Earlie Counts, PT 01/25/2016 11:45 AM   [facility] [address] [place], [place], [address] Phone: [phone]   Fax:  [phone]  Name: [name] MRN: [id number] Date of Birth: 12/15/1940

## 2016-01-29 ENCOUNTER — Ambulatory Visit: Payer: Medicare Other | Admitting: Physical Therapy

## 2016-01-29 ENCOUNTER — Encounter: Payer: Medicare Other | Admitting: Physical Therapy

## 2016-01-29 DIAGNOSIS — R29898 Other symptoms and signs involving the musculoskeletal system: Secondary | ICD-10-CM

## 2016-01-29 DIAGNOSIS — M545 Low back pain, unspecified: Secondary | ICD-10-CM

## 2016-01-29 DIAGNOSIS — R293 Abnormal posture: Secondary | ICD-10-CM

## 2016-01-29 DIAGNOSIS — M25562 Pain in left knee: Secondary | ICD-10-CM

## 2016-01-29 DIAGNOSIS — M25511 Pain in right shoulder: Secondary | ICD-10-CM

## 2016-01-29 NOTE — Therapy (Signed)
[facility] [address] [place], [place], [address] Phone: [phone]   Fax:  [phone]  Physical Therapy Treatment  Patient Details  Name: [name] MRN: [id number] Date of Birth: 13-Oct-1941 Referring Provider: Hulan Saas, MD  Encounter Date: 01/29/2016      PT End of Session - 01/29/16 0741    Visit Number 7   Number of Visits 10   Date for PT Re-Evaluation 02/21/16   Authorization Type KX modifier after 15; G-code   PT Start Time 0735   PT Stop Time 0820   PT Time Calculation (min) 45 min   Activity Tolerance Patient tolerated treatment well      Past Medical History  Diagnosis Date  . Coronary artery disease   . Hyperlipidemia     takes Zetia daily  . Allergy   . Bronchitis   . Sleep apnea   . Cardiomyopathy, ischemic   . Obesity (BMI 30-39.9)   . Myocardial infarction [facility]) 2008    inferior wall, treated with BMS in RCA  . Complication of anesthesia     pt states he gets "crazy" after anesthesia  . Atrial fibrillation ([place])     persistent, failed DCCV;takes Metoprolol daily and taking Pacerone for 7days prior to surgery  . Hay fever     takes Zyrtec daily  . History of bronchitis     last time a year ago  . History of kidney stones   . Type II diabetes mellitus (HCC)     takes Metformin and Januvia daily;pt states he is not a diabetic but pre diabetic  . 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 S/P Maze operation for atrial fibrillation 01/27/2013    Complete bilateral lesion set using bipolar radiofrequency and cryothermy ablation with clipping of LA appendage  . Pleural effusion, left 02/21/2013    Past Surgical History  Procedure Laterality Date  . Tonsillectomy  1952  . Nasal septum surgery  2004    and sinus repair penta  . Umbilical hernia repair  01/2010  . Hernia repair  12/18/2009    Incarcerated umbilical hernia  . Cardioversion   11/30/2012    Procedure: CARDIOVERSION;  Surgeon: Sherren Mocha, MD;  Location: [facility] ENDOSCOPY;  Service: Cardiovascular;  Laterality: [name];  . Cystoscopy    . Skin taken off of top of mouth and reapplied to gum    . Coronary angioplasty with stent placement  01/17/13 and in 2008    1 stent placed in 2008  . Coronary artery bypass graft [name] 01/27/2013    Procedure: CORONARY ARTERY BYPASS GRAFTING (CABG) x  two; using left internal mammary artery and right leg greater saphenous vein harvested endoscopically;  Surgeon: Rexene Alberts, MD;  Location: [place];  Service: Open Heart Surgery;  Laterality: [name];  . Maze [name] 01/27/2013    Procedure: MAZE;  Surgeon: Rexene Alberts, MD;  Location: [place];  Service: Open Heart Surgery;  Laterality: [name];  . Intraoperative transesophageal echocardiogram [name] 01/27/2013    Procedure: INTRAOPERATIVE TRANSESOPHAGEAL ECHOCARDIOGRAM;  Surgeon: Rexene Alberts, MD;  Location: [place];  Service: Open Heart Surgery;  Laterality: [name];    There were no vitals filed for this visit.  Visit Diagnosis:  Pain in joint of right shoulder  Shoulder weakness  Posture abnormality  Bilateral low back pain without sciatica  Knee pain, acute, left  Subjective Assessment - 01/29/16 0736    Subjective Patient got appt days confused and arrives 1 day early.  Shoulder pain will stay about 3-4/10 it is status quo.  Sharp pain every once in a while.  Denies excessive soreness following therapy.  Knees hurt every once in a while.     Currently in Pain? Yes   Pain Score 4    Pain Location Shoulder   Pain Orientation Right   Pain Type Chronic pain   Pain Onset More than a month ago   Pain Frequency Constant   Pain Score 0   Pain Location Back                         [facility] Adult PT Treatment/Exercise - 01/29/16 0001    Shoulder Exercises: Supine   Protraction Strengthening;Right;15 reps   Protraction Weight (lbs) 5   Other Supine Exercises discontinued bent  arm row secondary to pain   Shoulder Exercises: Prone   Extension Strengthening;Right;20 reps;Weights   Extension Weight (lbs) 3   Horizontal ABduction 1 Strengthening;Right;15 reps   Horizontal ABduction 1 Weight (lbs) 0   Other Prone Exercises rows 3# 20x   Shoulder Exercises: Standing   Other Standing Exercises wall push ups 10x  2 ways 10x each   Other Standing Exercises bicep curls blue band 20x   Shoulder Exercises: ROM/Strengthening   UBE (Upper Arm Bike) Level 1x 6 minutes (3/3)   Moist Heat Therapy   Number Minutes Moist Heat 15 Minutes   Moist Heat Location Shoulder   Electrical Stimulation   Electrical Stimulation Location shoulder   Electrical Stimulation Action IFC   Electrical Stimulation Parameters 15 min 21 ma   Electrical Stimulation Goals Pain                  PT Short Term Goals - 01/29/16 [id number]    PT SHORT TERM GOAL #1   Title be independent in initial HEP   Status Achieved   PT SHORT TERM GOAL #2   Title stand and walk with erect posture and neutral pelvis > or = to 50% of the time   Status Achieved   PT SHORT TERM GOAL #3   Title report a 25% reduction in LBP with change of positions   Status Achieved   PT SHORT TERM GOAL #4   Title report < or = to 3/10 Lt knee pain with standing and walking   Status Achieved   PT SHORT TERM GOAL #5   Title verbalize and demonstrate correct body mechanics with lifting and work tasks to reduce lumbar strain   Status Achieved           PT Long Term Goals - 01/29/16 0749    PT LONG TERM GOAL #1   Title be independent in advanced HEP   Time 8   Period Weeks   Status On-going   PT LONG TERM GOAL #2   Title reduce FOTO to < or = to 44% limitation   Period Weeks   Status On-going   PT LONG TERM GOAL #3   Title stand erect with neutral pelivs > or = to 75% of the time   Time 8   Period Weeks   Status On-going   PT LONG TERM GOAL #4   Title report a 60% reduction in LBP and knee pain with standing  work tasks   Time 8   Period Weeks   Status On-going  PT LONG TERM GOAL #5   Title return to regular gym exercise program with modifications to avoid aggravation to shoulder   Time 8   Period Weeks   Status On-going   PT LONG TERM GOAL #6   Title Right deltoid, biceps, triceps, middle and lower trap strength to 4+/5 needed for moderate lifting below shoulder level   Time 8   Period Weeks   Status On-going               Plan - 01/29/16 1704    Clinical Impression Statement The patient arrives for appointment on wrong day but able to accomodate.  He reports has been to the gym to work his back and knees but continues to need skilled instruction on appropriate shoulder exercises  to avoid exacerbation of pain.  Discontinued supine bent arm lift and sidelying ER secondary to pain.  Able to perform other periscapular, deltoid and biceps exercises with min discomfort which was further relieved by e-stim/heat.  Treatment focus continues to be on shoulder per patient request.     PT Next Visit Plan Rt shoulder strength and AROM, e-stim/heat as needed        Problem List Patient Active Problem List   Diagnosis Date Noted  . Right rotator cuff tear 12/20/2015  . Low back pain 12/20/2015  . Left knee pain 12/20/2015  . Pleural effusion, left 02/21/2013  . 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 S/P Maze operation for atrial fibrillation 01/27/2013  . Type II diabetes mellitus ([place]) 01/19/2013  . Hyperlipidemia 01/19/2013  . Obesity (BMI 30-39.9) 01/19/2013  . Hypertension 01/19/2013  . OSA (obstructive sleep apnea) 12/10/2012  . Coronary atherosclerosis of native coronary artery 12/09/2012  . Cardiomyopathy, ischemic 12/09/2012  . Atrial fibrillation ([place]) 10/25/2012    Alvera Singh 01/29/2016, 5:15 PM  [facility] [address], [place], [place], [address] Phone: [phone]   Fax:  [phone]  Name: [name] MRN: [id number] Date of Birth: 01/12/1941    Ruben Im, PT 01/29/2016 5:15 PM Phone: [phone] Fax: [phone]

## 2016-01-30 ENCOUNTER — Ambulatory Visit: Payer: Medicare Other

## 2016-02-01 ENCOUNTER — Ambulatory Visit: Payer: Medicare Other | Attending: Physical Medicine & Rehabilitation | Admitting: Physical Therapy

## 2016-02-01 DIAGNOSIS — M545 Low back pain: Secondary | ICD-10-CM | POA: Diagnosis present

## 2016-02-01 DIAGNOSIS — R29898 Other symptoms and signs involving the musculoskeletal system: Secondary | ICD-10-CM | POA: Insufficient documentation

## 2016-02-01 DIAGNOSIS — R293 Abnormal posture: Secondary | ICD-10-CM | POA: Insufficient documentation

## 2016-02-01 DIAGNOSIS — M25511 Pain in right shoulder: Secondary | ICD-10-CM | POA: Insufficient documentation

## 2016-02-01 DIAGNOSIS — M25562 Pain in left knee: Secondary | ICD-10-CM | POA: Diagnosis present

## 2016-02-01 NOTE — Therapy (Signed)
[facility] [address] [place], [place], [address] Phone: [phone]   Fax:  [phone]  Physical Therapy Treatment  Patient Details  Name: [name] MRN: [id number] Date of Birth: 24-Mar-1941 Referring Provider: Hulan Saas, MD  Encounter Date: 02/01/2016      PT End of Session - 02/01/16 1124    Visit Number 8   Number of Visits 10   Date for PT Re-Evaluation 02/21/16   Authorization Type KX modifier after 15; G-code   PT Start Time 1052   PT Stop Time 1145   PT Time Calculation (min) 53 min   Activity Tolerance Patient tolerated treatment well      Past Medical History  Diagnosis Date  . Coronary artery disease   . Hyperlipidemia     takes Zetia daily  . Allergy   . Bronchitis   . Sleep apnea   . Cardiomyopathy, ischemic   . Obesity (BMI 30-39.9)   . Myocardial infarction [facility]) 2008    inferior wall, treated with BMS in RCA  . Complication of anesthesia     pt states he gets "crazy" after anesthesia  . Atrial fibrillation ([place])     persistent, failed DCCV;takes Metoprolol daily and taking Pacerone for 7days prior to surgery  . Hay fever     takes Zyrtec daily  . History of bronchitis     last time a year ago  . History of kidney stones   . Type II diabetes mellitus (HCC)     takes Metformin and Januvia daily;pt states he is not a diabetic but pre diabetic  . 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 S/P Maze operation for atrial fibrillation 01/27/2013    Complete bilateral lesion set using bipolar radiofrequency and cryothermy ablation with clipping of LA appendage  . Pleural effusion, left 02/21/2013    Past Surgical History  Procedure Laterality Date  . Tonsillectomy  1952  . Nasal septum surgery  2004    and sinus repair penta  . Umbilical hernia repair  01/2010  . Hernia repair  12/18/2009    Incarcerated umbilical hernia  . Cardioversion   11/30/2012    Procedure: CARDIOVERSION;  Surgeon: Sherren Mocha, MD;  Location: [facility] ENDOSCOPY;  Service: Cardiovascular;  Laterality: [name];  . Cystoscopy    . Skin taken off of top of mouth and reapplied to gum    . Coronary angioplasty with stent placement  01/17/13 and in 2008    1 stent placed in 2008  . Coronary artery bypass graft [name] 01/27/2013    Procedure: CORONARY ARTERY BYPASS GRAFTING (CABG) x  two; using left internal mammary artery and right leg greater saphenous vein harvested endoscopically;  Surgeon: Rexene Alberts, MD;  Location: [place];  Service: Open Heart Surgery;  Laterality: [name];  . Maze [name] 01/27/2013    Procedure: MAZE;  Surgeon: Rexene Alberts, MD;  Location: [place];  Service: Open Heart Surgery;  Laterality: [name];  . Intraoperative transesophageal echocardiogram [name] 01/27/2013    Procedure: INTRAOPERATIVE TRANSESOPHAGEAL ECHOCARDIOGRAM;  Surgeon: Rexene Alberts, MD;  Location: [place];  Service: Open Heart Surgery;  Laterality: [name];    There were no vitals filed for this visit.  Visit Diagnosis:  Pain in joint of right shoulder  Shoulder weakness  Posture abnormality      Subjective Assessment - 02/01/16 1057    Subjective  Dull throb in shoulder, hurts all the time but I just go on and don't worry about.     Currently in Pain? Yes   Pain Score 4    Pain Orientation Right   Pain Type Chronic pain   Pain Frequency Constant                         [facility] Adult PT Treatment/Exercise - 02/01/16 0001    Shoulder Exercises: Seated   Other Seated Exercises UBE 6 min 3/3   Shoulder Exercises: Prone   Retraction Strengthening;Right;20 reps;Weights   Retraction Weight (lbs) 3   Extension Strengthening;Right;20 reps;Weights   Extension Weight (lbs) 3   Horizontal ABduction 1 Strengthening;Right;20 reps;Weights   Horizontal ABduction 1 Weight (lbs) 3   Shoulder Exercises: Standing   Horizontal ABduction Strengthening;Right;15 reps   Horizontal  ABduction Weight (lbs) blue band diagonals 15x each way   Other Standing Exercises single arm wall push ups   Other Standing Exercises Body blade 45 sec 3 positions   Shoulder Exercises: Power [employer] 25 reps   Row Limitations 25#   Other Power Biochemist, clinical lat bar 20# 25x B   Moist Heat Therapy   Number Minutes Moist Heat 15 Minutes   Moist Heat Location Shoulder   Electrical Stimulation   Electrical Stimulation Location shoulder   Electrical Stimulation Action IFC 21 ma   Electrical Stimulation Parameters 15   Electrical Stimulation Goals Pain                  PT Short Term Goals - 02/01/16 1136    PT SHORT TERM GOAL #1   Title be independent in initial HEP   Status Achieved   PT SHORT TERM GOAL #2   Title stand and walk with erect posture and neutral pelvis > or = to 50% of the time   Status Achieved   PT SHORT TERM GOAL #3   Title report a 25% reduction in LBP with change of positions   Status Achieved   PT SHORT TERM GOAL #4   Title report < or = to 3/10 Lt knee pain with standing and walking   Status Achieved   PT SHORT TERM GOAL #5   Title verbalize and demonstrate correct body mechanics with lifting and work tasks to reduce lumbar strain   Status Achieved           PT Long Term Goals - 02/01/16 1137    PT LONG TERM GOAL #1   Title be independent in advanced HEP   Period Weeks   Status On-going   PT LONG TERM GOAL #2   Title reduce FOTO to < or = to 44% limitation   Time 8   Period Weeks   Status On-going   PT LONG TERM GOAL #3   Title stand erect with neutral pelivs > or = to 75% of the time   Time 8   Period Weeks   Status On-going   PT LONG TERM GOAL #4   Title report a 60% reduction in LBP and knee pain with standing work tasks   Time 8   Period Weeks   Status On-going   PT LONG TERM GOAL #5   Title return to regular gym exercise program with modifications to avoid aggravation to shoulder   Time 8   Period Weeks    Status On-going   PT LONG TERM GOAL #6   Title  Right deltoid, biceps, triceps, middle and lower trap strength to 4+/5 needed for moderate lifting below shoulder level   Time 8   Period Weeks   Status On-going               Plan - 02/01/16 1124    Clinical Impression Statement The patient is able to participate in deltoid and periscapular strengthening without an exacerbation of pain.  Avoiding ER and repetitive overhead motions.  Therapist closely monitoring  for pain and modifying as needed.  Good pain relief from e-stim /heat.     PT Next Visit Plan Rt shoulder strength and AROM, e-stim/heat as needed        Problem List Patient Active Problem List   Diagnosis Date Noted  . Right rotator cuff tear 12/20/2015  . Low back pain 12/20/2015  . Left knee pain 12/20/2015  . Pleural effusion, left 02/21/2013  . 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 S/P Maze operation for atrial fibrillation 01/27/2013  . Type II diabetes mellitus ([place]) 01/19/2013  . Hyperlipidemia 01/19/2013  . Obesity (BMI 30-39.9) 01/19/2013  . Hypertension 01/19/2013  . OSA (obstructive sleep apnea) 12/10/2012  . Coronary atherosclerosis of native coronary artery 12/09/2012  . Cardiomyopathy, ischemic 12/09/2012  . Atrial fibrillation ([place]) 10/25/2012    Alvera Singh 02/01/2016, 11:38 AM  [facility] [address] [place], [place], [address] Phone: [phone]   Fax:  [phone]  Name: LORENTZ TAINTER MRN: [id number] Date of Birth: August 23, 1941    Ruben Im, PT 02/01/2016 11:39 AM Phone: [phone] Fax: [phone]

## 2016-02-05 ENCOUNTER — Ambulatory Visit: Payer: Medicare Other | Admitting: Physical Therapy

## 2016-02-05 DIAGNOSIS — M25511 Pain in right shoulder: Secondary | ICD-10-CM

## 2016-02-05 DIAGNOSIS — R293 Abnormal posture: Secondary | ICD-10-CM

## 2016-02-05 DIAGNOSIS — R29898 Other symptoms and signs involving the musculoskeletal system: Secondary | ICD-10-CM

## 2016-02-05 NOTE — Therapy (Signed)
[facility] [address], [place], [place], [address] Phone: [phone]   Fax:  [phone]  Physical Therapy Treatment  Patient Details  Name: [name] MRN: [id number] Date of Birth: [birth year] Referring Provider: Hulan Saas, MD  Encounter Date: 02/05/2016      PT End of Session - 02/05/16 0846    Visit Number 9   Number of Visits 10   Date for PT Re-Evaluation 02/21/16   Authorization Type KX modifier after 15; G-code   PT Start Time 0843   PT Stop Time 0945   PT Time Calculation (min) 62 min   Activity Tolerance Patient tolerated treatment well      Past Medical History  Diagnosis Date  . Coronary artery disease   . Hyperlipidemia     takes Zetia daily  . Allergy   . Bronchitis   . Sleep apnea   . Cardiomyopathy, ischemic   . Obesity (BMI 30-39.9)   . 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 Complication of anesthesia     pt states he gets "crazy" after anesthesia  . Atrial fibrillation ([place])     persistent, failed DCCV;takes Metoprolol daily and taking Pacerone for 7days prior to surgery  . Hay fever     takes Zyrtec daily  . History of bronchitis     last time a year ago  . History of kidney stones   . Type II diabetes mellitus (HCC)     takes Metformin and Januvia daily;pt states he is not a diabetic but pre diabetic  . 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 S/P Maze operation for atrial fibrillation 01/27/2013    Complete bilateral lesion set using bipolar radiofrequency and cryothermy ablation with clipping of LA appendage  . Pleural effusion, left 02/21/2013    Past Surgical History  Procedure Laterality Date  . Tonsillectomy  1952  . Nasal septum surgery  2004    and sinus repair penta  . Umbilical hernia repair  01/2010  . Hernia repair  12/18/2009    Incarcerated umbilical hernia  . Cardioversion   11/30/2012    Procedure: CARDIOVERSION;  Surgeon: Sherren Mocha, MD;  Location: [facility] ENDOSCOPY;  Service: Cardiovascular;  Laterality: [name];  . Cystoscopy    . Skin taken off of top of mouth and reapplied to gum    . Coronary angioplasty with stent placement  01/17/13 and in 2008    1 stent placed in 2008  . Coronary artery bypass graft [name] 01/27/2013    Procedure: CORONARY ARTERY BYPASS GRAFTING (CABG) x  two; using left internal mammary artery and right leg greater saphenous vein harvested endoscopically;  Surgeon: Rexene Alberts, MD;  Location: [place];  Service: Open Heart Surgery;  Laterality: [name];  . Maze [name] 01/27/2013    Procedure: MAZE;  Surgeon: Rexene Alberts, MD;  Location: [place];  Service: Open Heart Surgery;  Laterality: [name];  . Intraoperative transesophageal echocardiogram [name] 01/27/2013    Procedure: INTRAOPERATIVE TRANSESOPHAGEAL ECHOCARDIOGRAM;  Surgeon: Rexene Alberts, MD;  Location: [place];  Service: Open Heart Surgery;  Laterality: [name];    There were no vitals filed for this visit.  Visit Diagnosis:  Pain in joint of right shoulder  Shoulder weakness  Posture abnormality      Subjective Assessment - 02/05/16 0838    Subjective Shoulder about  the same.  It is just hurting al little bit more, more tender.  He is unsure why.  I walked a lot the other day in a hurry and the pain gets low in my back.  I can work it out.  My shoulder is what hurts more (than my back and knee.)     Currently in Pain? Yes   Pain Score 4    Pain Location Shoulder   Pain Orientation Right   Pain Type Chronic pain   Pain Onset More than a month ago   Pain Frequency Constant                         [facility] Adult PT Treatment/Exercise - 02/05/16 0001    Shoulder Exercises: Seated   Other Seated Exercises UBE 6 min 3/3   Other Seated Exercises seated thoracic extension overh 1/2 foam roll 10x   Shoulder Exercises: Prone   Retraction Strengthening;Right;20 reps;Weights    Retraction Weight (lbs) 3   Extension Strengthening;Right;20 reps;Weights   Extension Weight (lbs) 3   Horizontal ABduction 1 Strengthening;Right;20 reps;Weights   Horizontal ABduction 1 Weight (lbs) 3   Shoulder Exercises: Standing   Protraction Right;10 reps;Other (comment)   Protraction Limitations Doorway psoas stretch with right arm reach up doorway and across to other doorway     Horizontal ABduction Weight (lbs) blue band diagonals 30x each way   Flexion AROM;Strengthening;Right;Left;15 reps   Flexion Limitations marching on the wall   Other Standing Exercises wall walks with mild tension on red band 12x   Other Standing Exercises 7# B bicep curls   Shoulder Exercises: Isometric Strengthening   Flexion Limitations short lever deltoid isometric with thumbs crossed chin to forehead 20x   Shoulder Exercises: Power [employer] 25 reps   Row Limitations 25#   Other Power Biochemist, clinical lat bar 25# 25x B   Moist Heat Therapy   Number Minutes Moist Heat 15 Minutes   Moist Heat Location Shoulder   Electrical Stimulation   Electrical Stimulation Location shoulder   Electrical Stimulation Action IFC 16 ma   Electrical Stimulation Parameters 15   Electrical Stimulation Goals Pain                  PT Short Term Goals - 02/05/16 0846    PT SHORT TERM GOAL #1   Title be independent in initial HEP   Status Achieved   PT SHORT TERM GOAL #2   Title stand and walk with erect posture and neutral pelvis > or = to 50% of the time   Status Achieved   PT SHORT TERM GOAL #3   Title report a 25% reduction in LBP with change of positions   Status Achieved   PT SHORT TERM GOAL #4   Title report < or = to 3/10 Lt knee pain with standing and walking   Status Achieved   PT SHORT TERM GOAL #5   Title verbalize and demonstrate correct body mechanics with lifting and work tasks to reduce lumbar strain   Status Achieved           PT Long Term Goals - 02/05/16 0847    PT  LONG TERM GOAL #1   Title be independent in advanced HEP   Time 8   Period Weeks   Status On-going   PT LONG TERM GOAL #2   Title reduce FOTO to < or = to 44% limitation  Time 8   Period Weeks   Status On-going   PT LONG TERM GOAL #3   Title stand erect with neutral pelivs > or = to 75% of the time   Time 8   Period Weeks   Status On-going   PT LONG TERM GOAL #4   Title report a 60% reduction in LBP and knee pain with standing work tasks   Time 8   Period Weeks   Status On-going   PT LONG TERM GOAL #5   Title return to regular gym exercise program with modifications to avoid aggravation to shoulder   Time 8   Period Weeks   Status On-going   PT LONG TERM GOAL #6   Title Right deltoid, biceps, triceps, middle and lower trap strength to 4+/5 needed for moderate lifting below shoulder level   Time 8   Period Weeks   Status On-going               Plan - 02/05/16 0848    Clinical Impression Statement Close supervision to monitor pain and modifications as needed.  Avoidance of resisted ER and resisted overhead pressin motions secondary to irreparable RTC tear.  Treatment focus on deltoid strengthening and scapular muscle strengthening.     PT Next Visit Plan Do FOTO and G code next visit;  Deltoid, lat, middle and lower trap stengthening within pain limits; pain relieving modalities as needed        Problem List Patient Active Problem List   Diagnosis Date Noted  . Right rotator cuff tear 12/20/2015  . Low back pain 12/20/2015  . Left knee pain 12/20/2015  . Pleural effusion, left 02/21/2013  . 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 S/P Maze operation for atrial fibrillation 01/27/2013  . Type II diabetes mellitus ([place]) 01/19/2013  . Hyperlipidemia 01/19/2013  . Obesity (BMI 30-39.9) 01/19/2013  . Hypertension 01/19/2013  . OSA (obstructive sleep apnea) 12/10/2012  . Coronary atherosclerosis of native coronary artery 12/09/2012  . Cardiomyopathy, ischemic 12/09/2012  .  Atrial fibrillation ([place]) 10/25/2012    Alvera Singh 02/05/2016, 10:17 AM   [facility] [address] [place], [place], [address] Phone: [phone]   Fax:  [phone]  Name: EMMERICH PADALINO MRN: [id number] Date of Birth: Oct 08, 1941    Ruben Im, PT 02/05/2016 10:17 AM Phone: [phone] Fax: [phone]

## 2016-02-08 ENCOUNTER — Ambulatory Visit: Payer: Medicare Other | Admitting: Physical Therapy

## 2016-02-08 DIAGNOSIS — R293 Abnormal posture: Secondary | ICD-10-CM

## 2016-02-08 DIAGNOSIS — M25511 Pain in right shoulder: Secondary | ICD-10-CM | POA: Diagnosis not present

## 2016-02-08 DIAGNOSIS — R29898 Other symptoms and signs involving the musculoskeletal system: Secondary | ICD-10-CM

## 2016-02-08 NOTE — Therapy (Signed)
[facility] [address], [place], [place], [address] Phone: [phone]   Fax:  [phone]  Physical Therapy Treatment  Patient Details  Name: [name] MRN: [id number] Date of Birth: 1941-09-17 Referring Provider: Hulan Saas, MD  Encounter Date: 02/08/2016      PT End of Session - 02/08/16 1040    Visit Number 10   Number of Visits 20   Date for PT Re-Evaluation 02/21/16   Authorization Type KX modifier after 15; G-code   PT Start Time 1015   PT Stop Time 1105   PT Time Calculation (min) 50 min   Activity Tolerance Patient tolerated treatment well      Past Medical History  Diagnosis Date  . Coronary artery disease   . Hyperlipidemia     takes Zetia daily  . Allergy   . Bronchitis   . Sleep apnea   . Cardiomyopathy, ischemic   . Obesity (BMI 30-39.9)   . Myocardial infarction [facility]) 2008    inferior wall, treated with BMS in RCA  . Complication of anesthesia     pt states he gets "crazy" after anesthesia  . Atrial fibrillation ([place])     persistent, failed DCCV;takes Metoprolol daily and taking Pacerone for 7days prior to surgery  . Hay fever     takes Zyrtec daily  . History of bronchitis     last time a year ago  . History of kidney stones   . Type II diabetes mellitus (H[facility])     takes Metformin and Januvia daily;pt states he is not a diabetic but pre diabetic  . 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 S/P Maze operation for atrial fibrillation 01/27/2013    Complete bilateral lesion set using bipolar radiofrequency and cryothermy ablation with clipping of LA appendage  . Pleural effusion, left 02/21/2013    Past Surgical History  Procedure Laterality Date  . Tonsillectomy  1952  . Nasal septum surgery  2004    and sinus repair penta  . Umbilical hernia repair  01/2010  . Hernia repair  12/18/2009    Incarcerated umbilical hernia  . Cardioversion   11/30/2012    Procedure: CARDIOVERSION;  Surgeon: Sherren Mocha, MD;  Location: [facility] ENDOSCOPY;  Service: Cardiovascular;  Laterality: [name];  . Cystoscopy    . Skin taken off of top of mouth and reapplied to gum    . Coronary angioplasty with stent placement  01/17/13 and in 2008    1 stent placed in 2008  . Coronary artery bypass graft [name] 01/27/2013    Procedure: CORONARY ARTERY BYPASS GRAFTING (CABG) x  two; using left internal mammary artery and right leg greater saphenous vein harvested endoscopically;  Surgeon: Rexene Alberts, MD;  Location: [place];  Service: Open Heart Surgery;  Laterality: [name];  . Maze [name] 01/27/2013    Procedure: MAZE;  Surgeon: Rexene Alberts, MD;  Location: [place];  Service: Open Heart Surgery;  Laterality: [name];  . Intraoperative transesophageal echocardiogram [name] 01/27/2013    Procedure: INTRAOPERATIVE TRANSESOPHAGEAL ECHOCARDIOGRAM;  Surgeon: Rexene Alberts, MD;  Location: [place];  Service: Open Heart Surgery;  Laterality: [name];    There were no vitals filed for this visit.  Visit Diagnosis:  Pain in joint of right shoulder  Shoulder weakness  Posture abnormality      Subjective Assessment - 02/08/16 1020    Subjective No new  complaints.   Currently in Pain? Yes   Pain Score 4    Pain Location Shoulder   Pain Orientation Right   Pain Onset More than a month ago   Pain Frequency Constant   Aggravating Factors  reaching, sudden movements            [facility] PT Assessment - 02/08/16 0001    Observation/Other Assessments   Focus on Therapeutic Outcomes (FOTO)  51% limitation                     [facility] Adult PT Treatment/Exercise - 02/08/16 0001    Shoulder Exercises: Seated   Other Seated Exercises UBE 6 min 3/3   Shoulder Exercises: Standing   Horizontal ABduction Weight (lbs) blue band diagonals 30x each way   Extension Strengthening;Right;20 reps;Theraband   Theraband Level (Shoulder Extension) Level 4 (Blue)   Other Standing Exercises  dynamic shoulder depression against ball    Shoulder Exercises: ROM/Strengthening   Ball on Wall press and circles CW and CCW    Other ROM/Strengthening Exercises middle trap wall walks for red band   Shoulder Exercises: Isometric Strengthening   Flexion 5X5"   External Rotation 5X5"   Internal Rotation 5X5"   ABduction 5X5"   Moist Heat Therapy   Number Minutes Moist Heat 15 Minutes   Moist Heat Location Shoulder   Electrical Stimulation   Electrical Stimulation Location shoulder   Electrical Stimulation Action IFC 16 ma   Electrical Stimulation Parameters 15   Electrical Stimulation Goals Pain                  PT Short Term Goals - 02/08/16 1157    PT SHORT TERM GOAL #1   Title be independent in initial HEP   Status Achieved   PT SHORT TERM GOAL #2   Title stand and walk with erect posture and neutral pelvis > or = to 50% of the time   Status Achieved   PT SHORT TERM GOAL #3   Title report a 25% reduction in LBP with change of positions   Status Achieved   PT SHORT TERM GOAL #4   Title report < or = to 3/10 Lt knee pain with standing and walking   Status Achieved   PT SHORT TERM GOAL #5   Title verbalize and demonstrate correct body mechanics with lifting and work tasks to reduce lumbar strain   Status Achieved           PT Long Term Goals - 02/05/16 0847    PT LONG TERM GOAL #1   Title be independent in advanced HEP   Time 8   Period Weeks   Status On-going   PT LONG TERM GOAL #2   Title reduce FOTO to < or = to 44% limitation   Time 8   Period Weeks   Status On-going   PT LONG TERM GOAL #3   Title stand erect with neutral pelivs > or = to 75% of the time   Time 8   Period Weeks   Status On-going   PT LONG TERM GOAL #4   Title report a 60% reduction in LBP and knee pain with standing work tasks   Time 8   Period Weeks   Status On-going   PT LONG TERM GOAL #5   Title return to regular gym exercise program with modifications to avoid  aggravation to shoulder   Time 8   Period Weeks   Status  On-going   PT LONG TERM GOAL #6   Title Right deltoid, biceps, triceps, middle and lower trap strength to 4+/5 needed for moderate lifting below shoulder level   Time 8   Period Weeks   Status On-going               Plan - 2016-02-28 1041    Clinical Impression Statement Therapist closely monitoring for pain and modifying to avoid painful motions (> 110 degrees flexion, > 30 degrees ER).   Good improvement in FOTO functional outcome from 67% to 51%.   No recent complaints of knee or back pain so recent focus  has been on shoulder.  Anticipate he will meet remaining goals in next 2 weeks.  Good pain relief with modalities.     PT Next Visit Plan   Deltoid, middle and lower trap strengthening;  e-stim/heat for pain control;  recheck ROM/strength           G-Codes - 02/28/16 1201-03-22    Functional Assessment Tool Used FOTO   Functional Limitation Other PT primary   Other PT Primary Current Status [id number]) At least 40 percent but less than 60 percent impaired, limited or restricted   Other PT Primary Goal Status [id number]) At least 40 percent but less than 60 percent impaired, limited or restricted      Problem List Patient Active Problem List   Diagnosis Date Noted  . Right rotator cuff tear 12/20/2015  . Low back pain 12/20/2015  . Left knee pain 12/20/2015  . Pleural effusion, left 02/21/2013  . 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 S/P Maze operation for atrial fibrillation 01/27/2013  . Type II diabetes mellitus ([place]) 01/19/2013  . Hyperlipidemia 01/19/2013  . Obesity (BMI 30-39.9) 01/19/2013  . Hypertension 01/19/2013  . OSA (obstructive sleep apnea) 12/10/2012  . Coronary atherosclerosis of native coronary artery 12/09/2012  . Cardiomyopathy, ischemic 12/09/2012  . Atrial fibrillation ([place]) 10/25/2012    Alvera Singh February 28, 2016, 12:17 PM  [facility] [address] [place], [place], [address] Phone: [phone]   Fax:  [phone]  Name: RYON FAY MRN: [id number] Date of Birth: 21-Aug-1941    Ruben Im, PT 02/28/2016 12:17 PM Phone: [phone] Fax: [phone]

## 2016-02-12 ENCOUNTER — Ambulatory Visit: Payer: Medicare Other | Admitting: Physical Therapy

## 2016-02-12 DIAGNOSIS — R29898 Other symptoms and signs involving the musculoskeletal system: Secondary | ICD-10-CM

## 2016-02-12 DIAGNOSIS — M25511 Pain in right shoulder: Secondary | ICD-10-CM | POA: Diagnosis not present

## 2016-02-12 NOTE — Therapy (Signed)
[facility] [address], [place], [place], [address] Phone: [phone]   Fax:  [phone]  Physical Therapy Treatment  Patient Details  Name: [name] MRN: [id number] Date of Birth: February 16, 1941 Referring Provider: Hulan Saas, MD  Encounter Date: 02/12/2016      PT End of Session - 02/12/16 0940    Visit Number 11   Number of Visits 20   Date for PT Re-Evaluation 02/21/16   PT Start Time 0177   PT Stop Time 0945   PT Time Calculation (min) 48 min   Activity Tolerance Patient tolerated treatment well      Past Medical History  Diagnosis Date  . Coronary artery disease   . Hyperlipidemia     takes Zetia daily  . Allergy   . Bronchitis   . Sleep apnea   . Cardiomyopathy, ischemic   . Obesity (BMI 30-39.9)   . Myocardial infarction [facility]) 2008    inferior wall, treated with BMS in RCA  . Complication of anesthesia     pt states he gets "crazy" after anesthesia  . Atrial fibrillation ([place])     persistent, failed DCCV;takes Metoprolol daily and taking Pacerone for 7days prior to surgery  . Hay fever     takes Zyrtec daily  . History of bronchitis     last time a year ago  . History of kidney stones   . Type II diabetes mellitus (HCC)     takes Metformin and Januvia daily;pt states he is not a diabetic but pre diabetic  . 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 S/P Maze operation for atrial fibrillation 01/27/2013    Complete bilateral lesion set using bipolar radiofrequency and cryothermy ablation with clipping of LA appendage  . Pleural effusion, left 02/21/2013    Past Surgical History  Procedure Laterality Date  . Tonsillectomy  1952  . Nasal septum surgery  2004    and sinus repair penta  . Umbilical hernia repair  01/2010  . Hernia repair  12/18/2009    Incarcerated umbilical hernia  . Cardioversion  11/30/2012    Procedure: CARDIOVERSION;  Surgeon:  Sherren Mocha, MD;  Location: [facility] ENDOSCOPY;  Service: Cardiovascular;  Laterality: [name];  . Cystoscopy    . Skin taken off of top of mouth and reapplied to gum    . Coronary angioplasty with stent placement  01/17/13 and in 2008    1 stent placed in 2008  . Coronary artery bypass graft [name] 01/27/2013    Procedure: CORONARY ARTERY BYPASS GRAFTING (CABG) x  two; using left internal mammary artery and right leg greater saphenous vein harvested endoscopically;  Surgeon: Rexene Alberts, MD;  Location: [place];  Service: Open Heart Surgery;  Laterality: [name];  . Maze [name] 01/27/2013    Procedure: MAZE;  Surgeon: Rexene Alberts, MD;  Location: [place];  Service: Open Heart Surgery;  Laterality: [name];  . Intraoperative transesophageal echocardiogram [name] 01/27/2013    Procedure: INTRAOPERATIVE TRANSESOPHAGEAL ECHOCARDIOGRAM;  Surgeon: Rexene Alberts, MD;  Location: [place];  Service: Open Heart Surgery;  Laterality: [name];    There were no vitals filed for this visit.  Visit Diagnosis:  Pain in joint of right shoulder  Shoulder weakness      Subjective Assessment - 02/12/16 0857    Subjective No new complaints.     Currently in Pain? Yes   Pain  Score 3    Pain Location Shoulder   Pain Orientation Right   Pain Type Chronic pain   Pain Frequency Constant                         [facility] Adult PT Treatment/Exercise - 02/12/16 0001    Shoulder Exercises: Standing   Other Standing Exercises green band isometric abd, flexion, IR and ER isometrically 10x eac 5 sec hold   Shoulder Exercises: ROM/Strengthening   UBE (Upper Arm Bike) Level 1x 6 minutes (3/3)   Other ROM/Strengthening Exercises eccentric lowering from scaption 15x   Shoulder Exercises: Power [employer] 25 reps   Other Power [employer] Exercises Standing lat bar 25# 25x B   Moist Heat Therapy   Number Minutes Moist Heat 15 Minutes   Moist Heat Location Shoulder   Electrical Stimulation   Electrical Stimulation Location shoulder    Electrical Stimulation Action IFC   Electrical Stimulation Parameters 15   Electrical Stimulation Goals Pain                  PT Short Term Goals - 02/12/16 0950    PT SHORT TERM GOAL #1   Title be independent in initial HEP   Status Achieved   PT SHORT TERM GOAL #2   Title stand and walk with erect posture and neutral pelvis > or = to 50% of the time   Status Achieved   PT SHORT TERM GOAL #3   Title report a 25% reduction in LBP with change of positions   Status Achieved   PT SHORT TERM GOAL #4   Title report < or = to 3/10 Lt knee pain with standing and walking   Status Achieved   PT SHORT TERM GOAL #5   Title verbalize and demonstrate correct body mechanics with lifting and work tasks to reduce lumbar strain   Status Achieved           PT Long Term Goals - 02/12/16 0951    PT LONG TERM GOAL #1   Title be independent in advanced HEP   Time 8   Period Weeks   Status On-going   PT LONG TERM GOAL #2   Title reduce FOTO to < or = to 44% limitation   Time 8   Period Weeks   Status On-going   PT LONG TERM GOAL #3   Title stand erect with neutral pelivs > or = to 75% of the time   Time 8   Period Weeks   Status On-going   PT LONG TERM GOAL #4   Title report a 60% reduction in LBP and knee pain with standing work tasks   Time 8   Period Weeks   Status On-going   PT LONG TERM GOAL #5   Title return to regular gym exercise program with modifications to avoid aggravation to shoulder   Time 8   Status On-going   PT LONG TERM GOAL #6   Title Right deltoid, biceps, triceps, middle and lower trap strength to 4+/5 needed for moderate lifting below shoulder level   Time 8   Period Weeks   Status On-going               Plan - 02/12/16 0940    Clinical Impression Statement The patient is able to do deltoid strengthening and eccentric scaption lowering without pain exacerbation.  Avoidance of repetitive overhead and focus on short arc movements  secondary rotator cuff pathology.   Therapist closely monitoring for pain for modifications as needed.  Probable discharge in 2-3 visits if majority of goals met.     PT Next Visit Plan   Deltoid, middle and lower trap strengthening;  e-stim/heat for pain control;  recheck ROM/strength         Problem List Patient Active Problem List   Diagnosis Date Noted  . Right rotator cuff tear 12/20/2015  . Low back pain 12/20/2015  . Left knee pain 12/20/2015  . Pleural effusion, left 02/21/2013  . 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 S/P Maze operation for atrial fibrillation 01/27/2013  . Type II diabetes mellitus ([place]) 01/19/2013  . Hyperlipidemia 01/19/2013  . Obesity (BMI 30-39.9) 01/19/2013  . Hypertension 01/19/2013  . OSA (obstructive sleep apnea) 12/10/2012  . Coronary atherosclerosis of native coronary artery 12/09/2012  . Cardiomyopathy, ischemic 12/09/2012  . Atrial fibrillation ([place]) 10/25/2012    Alvera Singh 02/12/2016, 9:53 AM  [facility] [address] [place], [place], [address] Phone: [phone]   Fax:  [phone]  Name: [name] MRN: [id number] Date of Birth: 1941-04-03    Ruben Im, PT 02/12/2016 9:54 AM Phone: [phone] Fax: [phone]

## 2016-02-19 ENCOUNTER — Ambulatory Visit: Payer: Medicare Other | Admitting: Physical Therapy

## 2016-02-19 DIAGNOSIS — M25511 Pain in right shoulder: Secondary | ICD-10-CM | POA: Diagnosis not present

## 2016-02-19 DIAGNOSIS — R29898 Other symptoms and signs involving the musculoskeletal system: Secondary | ICD-10-CM
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
## 2016-02-21 ENCOUNTER — Ambulatory Visit: Payer: Medicare Other | Admitting: Physical Therapy

## 2016-02-21 DIAGNOSIS — M25562 Pain in left knee: Secondary | ICD-10-CM

## 2016-02-21 DIAGNOSIS — M25511 Pain in right shoulder: Secondary | ICD-10-CM | POA: Diagnosis not present

## 2016-02-21 DIAGNOSIS — R29898 Other symptoms and signs involving the musculoskeletal system: Secondary | ICD-10-CM

## 2016-02-21 DIAGNOSIS — M545 Low back pain, unspecified: Secondary | ICD-10-CM

## 2016-02-21 DIAGNOSIS — R293 Abnormal posture: Secondary | ICD-10-CM

## 2016-02-21 NOTE — Therapy (Signed)
[facility] [address] [place], [place], [address] Phone: [phone]   Fax:  [phone]  Physical Therapy Treatment/Discharge Summary  Patient Details  Name: [name] MRN: [id number] Date of Birth: May 08, 1941 Referring Provider: Hulan Saas, MD  Encounter Date: 02/21/2016      PT End of Session - 02/21/16 0910    Visit Number 13   Number of Visits 20   Date for PT Re-Evaluation 02/21/16   PT Start Time 0845   PT Stop Time 0930   PT Time Calculation (min) 45 min   Activity Tolerance Patient tolerated treatment well      Past Medical History  Diagnosis Date  . Coronary artery disease   . Hyperlipidemia     takes Zetia daily  . Allergy   . Bronchitis   . Sleep apnea   . Cardiomyopathy, ischemic   . Obesity (BMI 30-39.9)   . Myocardial infarction [facility]) 2008    inferior wall, treated with BMS in RCA  . Complication of anesthesia     pt states he gets "crazy" after anesthesia  . Atrial fibrillation ([place])     persistent, failed DCCV;takes Metoprolol daily and taking Pacerone for 7days prior to surgery  . Hay fever     takes Zyrtec daily  . History of bronchitis     last time a year ago  . History of kidney stones   . Type II diabetes mellitus (HCC)     takes Metformin and Januvia daily;pt states he is not a diabetic but pre diabetic  . 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 S/P Maze operation for atrial fibrillation 01/27/2013    Complete bilateral lesion set using bipolar radiofrequency and cryothermy ablation with clipping of LA appendage  . Pleural effusion, left 02/21/2013    Past Surgical History  Procedure Laterality Date  . Tonsillectomy  1952  . Nasal septum surgery  2004    and sinus repair penta  . Umbilical hernia repair  01/2010  . Hernia repair  12/18/2009    Incarcerated umbilical hernia  . Cardioversion  11/30/2012    Procedure:  CARDIOVERSION;  Surgeon: Sherren Mocha, MD;  Location: [facility] ENDOSCOPY;  Service: Cardiovascular;  Laterality: [name];  . Cystoscopy    . Skin taken off of top of mouth and reapplied to gum    . Coronary angioplasty with stent placement  01/17/13 and in 2008    1 stent placed in 2008  . Coronary artery bypass graft [name] 01/27/2013    Procedure: CORONARY ARTERY BYPASS GRAFTING (CABG) x  two; using left internal mammary artery and right leg greater saphenous vein harvested endoscopically;  Surgeon: Rexene Alberts, MD;  Location: [place];  Service: Open Heart Surgery;  Laterality: [name];  . Maze [name] 01/27/2013    Procedure: MAZE;  Surgeon: Rexene Alberts, MD;  Location: [place];  Service: Open Heart Surgery;  Laterality: [name];  . Intraoperative transesophageal echocardiogram [name] 01/27/2013    Procedure: INTRAOPERATIVE TRANSESOPHAGEAL ECHOCARDIOGRAM;  Surgeon: Rexene Alberts, MD;  Location: [place];  Service: Open Heart Surgery;  Laterality: [name];    There were no vitals filed for this visit.  Visit Diagnosis:  Pain in joint of right shoulder  Shoulder weakness  Posture abnormality  Bilateral low back pain without sciatica  Knee pain, acute, left      Subjective Assessment - 02/21/16 7564  Subjective States he was disappointed in his ortho appt yesterday.  Nothing new.    Reports he goes to the gym about every day at 5 am.   Currently in Pain? Yes   Pain Score 3    Pain Location Shoulder   Pain Frequency Constant   Aggravating Factors  lifting heavier objects overhead;  lifting with arm extended    Pain Score 0   Pain Location Back            [facility] PT Assessment - 02/21/16 0001    Observation/Other Assessments   Focus on Therapeutic Outcomes (FOTO)  53% limitation  51% on 3/10   AROM   Overall AROM  --  HS 70 degrees   Overall AROM Comments hip flexion 120 B, hip IR and ER 25% limited;  knee flexion and extension full   AROM Assessment Site Lumbar   Right Shoulder Flexion 170 Degrees    Right Shoulder ABduction 175 Degrees   Right Shoulder External Rotation 60 Degrees   Lumbar Flexion 70   Lumbar Extension 40   Lumbar - Right Side Bend 40   Lumbar - Left Side Bend 40   Lumbar - Right Rotation WFLs   Lumbar - Left Rotation WFLs   Strength   Right/Left Shoulder --  Grossly 5-/5                     [facility] Adult PT Treatment/Exercise - 02/21/16 0001    Knee/Hip Exercises: Aerobic   Nustep L2 13 min   Shoulder Exercises: Standing   Other Standing Exercises review of HEP   Moist Heat Therapy   Number Minutes Moist Heat 15 Minutes   Moist Heat Location Shoulder   Electrical Stimulation   Electrical Stimulation Location shoulder   Electrical Stimulation Action IFC                PT Education - 02/21/16 0943    Education provided Yes   Education Details review of HEP and appropriate gym program    Person(s) Educated Patient   Methods Explanation;Demonstration;Handout   Comprehension Verbalized understanding          PT Short Term Goals - 02/21/16 1104    PT SHORT TERM GOAL #1   Title be independent in initial HEP   Status Achieved   PT SHORT TERM GOAL #2   Title stand and walk with erect posture and neutral pelvis > or = to 50% of the time   Status Achieved   PT SHORT TERM GOAL #3   Title report a 25% reduction in LBP with change of positions   Status Achieved   PT SHORT TERM GOAL #4   Title report < or = to 3/10 Lt knee pain with standing and walking   Status Achieved   PT SHORT TERM GOAL #5   Title verbalize and demonstrate correct body mechanics with lifting and work tasks to reduce lumbar strain   Status Achieved           PT Long Term Goals - 02/21/16 1105    PT LONG TERM GOAL #1   Title be independent in advanced HEP   Status Achieved   PT LONG TERM GOAL #2   Title reduce FOTO to < or = to 44% limitation   Status Partially Met   PT LONG TERM GOAL #3   Title stand erect with neutral pelivs > or = to 75% of the  time   Status Achieved  PT LONG TERM GOAL #4   Title report a 60% reduction in LBP and knee pain with standing work tasks   Status Partially Met   PT LONG TERM GOAL #5   Title return to regular gym exercise program with modifications to avoid aggravation to shoulder   Status Achieved   PT LONG TERM GOAL #6   Title Right deltoid, biceps, triceps, middle and lower trap strength to 4+/5 needed for moderate lifting below shoulder level   Status Achieved               Plan - 03-12-2016 0943    Clinical Impression Statement The patient has met the majority of goals and is independent in a home and gym program.  Despite rotator cuff tear, he has very good AROM of the shoulder, compensating well with this deltoids, middle and lower traps, biceps and deltoids.  Minimal complaint of back or knee pain during the course of his PT.  His lumbar AROM has improved since initial eval and his knee ROM is full.  Decreased hip IR and ER decreased by 25% and decreased HS length to 70 degrees.  Discussed ex program to discuss remaining deficits.  Discharge from PT at this time.            G-Codes - 03-12-16 1107    Functional Assessment Tool Used FOTO   Functional Limitation Other PT primary   Other PT Primary Current Status [phone]) At least 20 percent but less than 40 percent impaired, limited or restricted   Other PT Primary Goal Status ([id number]) At least 40 percent but less than 60 percent impaired, limited or restricted   Other PT Primary Discharge Status [phone]) At least 20 percent but less than 40 percent impaired, limited or restricted    PHYSICAL THERAPY DISCHARGE SUMMARY  Visits from Start of Care:13  Current functional level related to goals / functional outcomes: See clinical impressions above   Remaining deficits: As above   Education / Equipment: HEP/Gym program Plan: Patient agrees to discharge.  Patient goals were partially met. Patient is being discharged due to meeting the  stated rehab goals.  ?????         Problem List Patient Active Problem List   Diagnosis Date Noted  . Right rotator cuff tear 12/20/2015  . Low back pain 12/20/2015  . Left knee pain 12/20/2015  . Pleural effusion, left 02/21/2013  . 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 S/P Maze operation for atrial fibrillation 01/27/2013  . Type II diabetes mellitus ([place]) 01/19/2013  . Hyperlipidemia 01/19/2013  . Obesity (BMI 30-39.9) 01/19/2013  . Hypertension 01/19/2013  . OSA (obstructive sleep apnea) 12/10/2012  . Coronary atherosclerosis of native coronary artery 12/09/2012  . Cardiomyopathy, ischemic 12/09/2012  . Atrial fibrillation ([place]) 10/25/2012   Ruben Im, PT 03/12/16 11:10 AM Phone: [phone] Fax: [phone]   Alvera Singh 03/12/2016, 11:09 AM  [facility] [address], [place], [place], [address] Phone: [phone]   Fax:  [phone]  Name: PAVAN BRING MRN: [id number] Date of Birth: 07/15/1941

## 2016-04-07 ENCOUNTER — Ambulatory Visit (INDEPENDENT_AMBULATORY_CARE_PROVIDER_SITE_OTHER): Payer: Medicare Other | Admitting: Cardiovascular Disease

## 2016-04-07 ENCOUNTER — Encounter: Payer: Self-pay | Admitting: Cardiovascular Disease

## 2016-04-07 VITALS — BP 126/70 | HR 60 | Ht 71.0 in | Wt 237.1 lb

## 2016-04-07 DIAGNOSIS — I25810 Atherosclerosis of coronary artery bypass graft(s) without angina pectoris: Secondary | ICD-10-CM | POA: Diagnosis not present

## 2016-04-07 NOTE — Progress Notes (Signed)
Cardiology Office Note Date:  04/07/2016   ID:  [name], DOB 09-05-1941, MRN [id number]  PCP:  Myriam Jacobson, MD  Cardiologist:  Sherren Mocha, MD    Chief Complaint  Patient presents with  . Atrial Fibrillation    no cp,sob,lee,or claudication    History of Present Illness: [name] is a 75 y.o. male who presents for follow-up evaluation. The patient is followed for atrial fibrillation and coronary artery disease. He presented with a myocardial infarction initially in 2008 and was treated with stenting of the right coronary artery. He followed up and was found to have atrial fibrillation in 2013. He had progressive LV dysfunction. Cardiac catheterization demonstrated progressive multivessel coronary artery disease. He was ultimately treated with two-vessel CABG and cryo- Cox-Maze surgery in February 2014.  The patient is doing well. He is just started back on an exercise regimen. He worked out this morning and had no exertional symptoms. He specifically denies chest pain, chest pressure, or shortness of breath. He is compliant with his medications. He's been intolerant of statin drugs. He was evaluated in the lipid clinic for consideration of a PCSK9 inhibitor. His copay was $330/injection, so he declined.   Past Medical History  Diagnosis Date  . Coronary artery disease   . Hyperlipidemia     takes Zetia daily  . Allergy   . Bronchitis   . Sleep apnea   . Cardiomyopathy, ischemic   . Obesity (BMI 30-39.9)   . Myocardial infarction [facility]) 2008    inferior wall, treated with BMS in RCA  . Complication of anesthesia     pt states he gets "crazy" after anesthesia  . Atrial fibrillation ([place])     persistent, failed DCCV;takes Metoprolol daily and taking Pacerone for 7days prior to surgery  . Hay fever     takes Zyrtec daily  . History of bronchitis     last time a year ago  . History of kidney stones   . Type II diabetes mellitus (HCC)     takes  Metformin and Januvia daily;pt states he is not a diabetic but pre diabetic  . 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 S/P Maze operation for atrial fibrillation 01/27/2013    Complete bilateral lesion set using bipolar radiofrequency and cryothermy ablation with clipping of LA appendage  . Pleural effusion, left 02/21/2013    Past Surgical History  Procedure Laterality Date  . Tonsillectomy  1952  . Nasal septum surgery  2004    and sinus repair penta  . Umbilical hernia repair  01/2010  . Hernia repair  12/18/2009    Incarcerated umbilical hernia  . Cardioversion  11/30/2012    Procedure: CARDIOVERSION;  Surgeon: Sherren Mocha, MD;  Location: [facility] ENDOSCOPY;  Service: Cardiovascular;  Laterality: [name];  . Cystoscopy    . Skin taken off of top of mouth and reapplied to gum    . Coronary angioplasty with stent placement  01/17/13 and in 2008    1 stent placed in 2008  . Coronary artery bypass graft [name] 01/27/2013    Procedure: CORONARY ARTERY BYPASS GRAFTING (CABG) x  two; using left internal mammary artery and right leg greater saphenous vein harvested endoscopically;  Surgeon: Rexene Alberts, MD;  Location: [place];  Service: Open Heart Surgery;  Laterality: [name];  . Maze [name] 01/27/2013    Procedure: MAZE;  Surgeon: Rexene Alberts, MD;  Location: [facility] OR;  Service: Open Heart Surgery;  Laterality: [name];  . Intraoperative transesophageal echocardiogram [name] 01/27/2013    Procedure: INTRAOPERATIVE TRANSESOPHAGEAL ECHOCARDIOGRAM;  Surgeon: Rexene Alberts, MD;  Location: [place];  Service: Open Heart Surgery;  Laterality: [name];    Current Outpatient Prescriptions  Medication Sig Dispense Refill  . Alirocumab (PRALUENT) 75 MG/ML SOPN Inject 1 pen into the skin every 14 (fourteen) days. 2 pen 11  . aspirin EC 81 MG EC tablet Take 1 tablet (81 mg total) by mouth daily.    . cetirizine (ZYRTEC) 10 MG tablet Take 10 mg by mouth daily.    . Cholecalciferol (VITAMIN D-3)  1000 units CAPS Take 2,000 Units by mouth daily.    . cyanocobalamin 500 MCG tablet Take 500 mcg by mouth daily.    Marland Kitchen glucose blood (CHOICE DM FORA G20 TEST STRIPS) test strip Use as instructed 100 each 12  . glucose monitoring kit (FREESTYLE) monitoring kit 1 each by Does not apply route as needed for other. 1 each 0  . Lancets (ONETOUCH ULTRASOFT) lancets Use as instructed 100 each 12  . losartan (COZAAR) 25 MG tablet Take 1 tablet (25 mg total) by mouth daily. 90 tablet 3  . metFORMIN (GLUCOPHAGE) 500 MG tablet Take 250 mg by mouth 2 (two) times daily with a meal.     . metoprolol succinate (TOPROL-XL) 25 MG 24 hr tablet Take 25 mg by mouth daily.    . Multiple Vitamins-Minerals (MULTIVITAMIN WITH MINERALS) tablet Take 1 tablet by mouth daily.    Marland Kitchen triamcinolone cream (KENALOG) 0.1 % Apply 1 application topically 2 (two) times daily.  0   No current facility-administered medications for this visit.    Allergies:   Asa arthritis strength-antacid; Metformin and related; and Statins   Social History:  The patient  reports that he has quit smoking. He has never used smokeless tobacco. He reports that he drinks alcohol. He reports that he does not use illicit drugs.   Family History:  The patient's  family history includes Cancer in his father; Diabetes in his father; Heart disease in his mother; Lung disease in his father.    ROS:  Please see the history of present illness. All other systems are reviewed and negative.    PHYSICAL EXAM: VS:  BP 126/70 mmHg  Pulse 60  Ht '5\' 11"'  (1.803 m)  Wt 237 lb 1.9 oz (107.557 kg)  BMI 33.09 kg/m2 , BMI Body mass index is 33.09 kg/(m^2). GEN: Well nourished, well developed, in no acute distress HEENT: normal Neck: no JVD, no masses. No carotid bruits Cardiac: RRR without murmur or gallop                Respiratory:  clear to auscultation bilaterally, normal work of breathing GI: soft, nontender, nondistended, + BS MS: no deformity or  atrophy Ext: no pretibial edema, pedal pulses 2+= bilaterally Skin: warm and dry, no rash Neuro:  Strength and sensation are intact Psych: euthymic mood, full affect  EKG:  EKG is ordered today. The ekg ordered today shows NSR with age-indeterminate inferior infarct, HR 60 bpm.  Recent Labs: 08/30/2015: ALT 21   Lipid Panel     Component Value Date/Time   CHOL 189 08/30/2015 0741   TRIG 104.0 08/30/2015 0741   HDL 35.40* 08/30/2015 0741   CHOLHDL 5 08/30/2015 0741   VLDL 20.8 08/30/2015 0741   LDLCALC 133* 08/30/2015 0741      Wt Readings from Last 3 Encounters:  04/07/16 237 lb 1.9 oz (107.557 kg)  12/20/15 237 lb (107.502 kg)  08/13/15 232 lb (105.235 kg)    Cardiac Studies Reviewed: 2D Echo 04/26/2013: Left ventricle: The cavity size was normal. Wall thickness was increased in a pattern of mild LVH. Systolic function was normal. The estimated ejection fraction was in the range of 50% to 55%. Wall motion was normal; there were no regional wall motion abnormalities. Doppler parameters are consistent with high ventricular filling pressure.  ------------------------------------------------------------ Aortic valve:  Trileaflet; mildly thickened leaflets. Cusp separation was normal. Doppler: Transvalvular velocity was within the normal range. There was no stenosis. No regurgitation.  ------------------------------------------------------------ Aorta: Aortic root: The aortic root was normal in size.  ------------------------------------------------------------ Mitral valve:  Calcified annulus. Leaflet separation was normal. Doppler: Transvalvular velocity was within the normal range. There was no evidence for stenosis. Trivial regurgitation.  Peak gradient: 69m Hg (D).  ------------------------------------------------------------ Left atrium: The atrium was mildly dilated.  ------------------------------------------------------------ Right ventricle:  The cavity size was normal. Systolic function was normal.  ------------------------------------------------------------ Pulmonic valve:  Structurally normal valve.  Cusp separation was normal. Doppler: Transvalvular velocity was within the normal range. Trivial regurgitation.  ------------------------------------------------------------ Tricuspid valve:  Structurally normal valve.  Leaflet separation was normal. Doppler: Transvalvular velocity was within the normal range. Mild regurgitation.  ------------------------------------------------------------ Pulmonary artery:  Systolic pressure was mildly increased.  ------------------------------------------------------------ Right atrium: The atrium was mildly dilated.  ------------------------------------------------------------ Pericardium: There was no pericardial effusion.  ------------------------------------------------------------ Systemic veins: Inferior vena cava: The vessel was normal in size; the respirophasic diameter changes were in the normal range (= 50%); findings are consistent with normal central venous pressure.  ASSESSMENT AND PLAN: 1.  CAD, native vessel, without angina: medications reviewed and will be continued without changes. He is tolerating aspirin, a beta blocker, and an ARB.  2. Essential hypertension: Blood pressure is well controlled on losartan metoprolol succinate  3. Hyperlipidemia: Limited treatment options as outlined in the history of present illness. He cannot tolerate statin drugs. Will continue to work on lifestyle modification. Counseling done today regarding diet and exercise.  Current medicines are reviewed with the patient today.  The patient does not have concerns regarding medicines.  Labs/ tests ordered today include:  No orders of the defined types were placed in this encounter.   Disposition:   FU 6 months  Signed, CSherren Mocha MD  04/07/2016 9:08 AM    [facility] [address] [place]  [id number]: [phone] Fax: [phone]

## 2016-04-07 NOTE — Patient Instructions (Signed)

## 2016-05-23 ENCOUNTER — Encounter (HOSPITAL_COMMUNITY): Payer: Self-pay | Admitting: *Deleted

## 2016-11-10 ENCOUNTER — Telehealth: Payer: Self-pay | Admitting: Internal Medicine

## 2016-11-10 NOTE — Telephone Encounter (Signed)
Spoke with the pt and scheduled cons with MW for Thurs at 8:45- 15 min slot ok per MW

## 2016-11-10 NOTE — Telephone Encounter (Signed)
This pt saw clance for osa so will be a new pt - ok to see for first available appt but if can't wait then should probably go to [facility]

## 2016-11-10 NOTE — Telephone Encounter (Signed)
MW  Please advise-Sick Message  This pt. Called requesting to see if he can be seen today and requesting to only been seen by you. He is a former [facility] pt, He is c/o chest tightness off and on, increase sob, he is not currently on a inhaler, coughing with tanish colored mucus, states he did have a fever at one point but not currently

## 2016-11-13 ENCOUNTER — Ambulatory Visit (INDEPENDENT_AMBULATORY_CARE_PROVIDER_SITE_OTHER): Payer: Medicare Other | Admitting: Internal Medicine

## 2016-11-13 ENCOUNTER — Encounter: Payer: Self-pay | Admitting: Internal Medicine

## 2016-11-13 ENCOUNTER — Ambulatory Visit (INDEPENDENT_AMBULATORY_CARE_PROVIDER_SITE_OTHER)
Admission: RE | Admit: 2016-11-13 | Discharge: 2016-11-13 | Disposition: A | Discharge status: Home / Self Care | Payer: Medicare Other | Source: Ambulatory Visit | Attending: Internal Medicine | Admitting: Internal Medicine

## 2016-11-13 ENCOUNTER — Other Ambulatory Visit (INDEPENDENT_AMBULATORY_CARE_PROVIDER_SITE_OTHER): Payer: Medicare Other

## 2016-11-13 VITALS — BP 124/68 | HR 80 | Ht 71.0 in | Wt 237.4 lb

## 2016-11-13 DIAGNOSIS — R058 Other specified cough: Secondary | ICD-10-CM

## 2016-11-13 DIAGNOSIS — R05 Cough: Secondary | ICD-10-CM

## 2016-11-13 MED ORDER — OMEPRAZOLE MAGNESIUM 20 MG PO TBEC: 20.0000 mg | DELAYED_RELEASE_TABLET | Freq: Every day | ORAL | Status: DC

## 2016-11-13 MED ORDER — PREDNISONE 10 MG PO TABS: ORAL_TABLET | ORAL | 0 refills | Status: DC

## 2016-11-13 MED ORDER — TRAMADOL HCL 50 MG PO TABS: ORAL_TABLET | ORAL | 0 refills | Status: DC

## 2016-11-13 MED ORDER — AMOXICILLIN-POT CLAVULANATE 875-125 MG PO TABS: 1.0000 | ORAL_TABLET | Freq: Two times a day (BID) | ORAL | 0 refills | Status: AC

## 2016-11-13 MED ORDER — FAMOTIDINE 20 MG PO TABS: ORAL_TABLET | ORAL | Status: DC

## 2016-11-13 NOTE — Progress Notes (Signed)
Subjective:    Patient ID: [name], male    DOB: 05-20-1941,    MRN: [id number]  HPI  44 yowm quit smoking in 1984 with tendency to bronchitis that improved p quit and usually just has it every other year or two and typcially req officevisits to get rid of but around 1st Nov 2017 similar onset but partial response to rx by Dr Janeice Robinson assoc With new chest discomfort with coughing and peristent yellow mucus so referred to pulmonary clinic 11/13/2016 by Dr Mancel Bale   11/13/2016 [facility] office visit/ Wert   Chief Complaint  Patient presents with  . Pulmonary Consult    Self referral. Pt c/o cough x 4-5 wks. Cough is prod with almond colored sputum.  Cough sometimes wakes him up at night.  He also c/o "lung pain" bilaterally.    chest discomfort only lasts up to 30 sec seems better if shifts positions/ bilateral post and not well localized or pleuritic. Prev eval by ENT at [facility] but no f/u since > since  Surgery ? A decade prior to OV  rx zyrtec D every day and if misses a dose "all stopped up"    Cough is worse at hs and in morning hours / mostly sob when coughing   No obvious other patterns in day to day or daytime variabilty or assoc   chest tightness, subjective wheeze overt sinus or hb symptoms. No unusual exp hx or h/o childhood pna/ asthma or knowledge of premature birth.     Also denies any obvious fluctuation of symptoms with weather or environmental changes or other aggravating or alleviating factors except as outlined above   Current Medications, Allergies, Complete Past Medical History, Past Surgical History, Family History, and Social History were reviewed in [employer] record.                Review of Systems  Constitutional: Negative for activity change, appetite change, chills, fever and unexpected weight change.  HENT: Positive for congestion, postnasal drip, rhinorrhea, sneezing and sore throat. Negative for dental  problem, trouble swallowing and voice change.   Eyes: Negative for visual disturbance.  Respiratory: Positive for cough and shortness of breath. Negative for choking.   Cardiovascular: Negative for chest pain and leg swelling.  Gastrointestinal: Negative for abdominal pain, nausea and vomiting.  Genitourinary: Negative for difficulty urinating.  Musculoskeletal: Negative for arthralgias.  Skin: Negative for rash.  Psychiatric/Behavioral: Negative for behavioral problems and confusion.       Objective:   Physical Exam    amb wm nad/ pos voice fatigue   Wt Readings from Last 3 Encounters:  11/13/16 237 lb 6.4 oz (107.7 kg)  04/07/16 237 lb 1.9 oz (107.6 kg)  12/20/15 237 lb (107.5 kg)    Vital signs reviewed  - Note on arrival 02 sats  100% on RA     HEENT: nl dentition, turbinates, and oropharynx. Nl external ear canals without cough reflex   NECK :  without JVD/Nodes/TM/ nl carotid upstrokes bilaterally   LUNGS: no acc muscle use,  Nl contour chest with prominent pseudowheeze better with plm   CV:  RRR  no s3 or murmur or increase in P2, nad no edema   ABD:  Tensely obese with nl inspiratory excursion in the supine position. No bruits or organomegaly appreciated, bowel sounds nl  MS:  Nl gait/ ext warm without deformities, calf tenderness, cyanosis or clubbing No obvious joint restrictions   SKIN:  warm and dry without lesions    NEURO:  alert, approp, nl sensorium with  no motor or cerebellar deficits apparent.     CXR PA and Lateral:   11/13/2016 :    I personally reviewed images and agree with radiology impression as follows:   Mild chronic bronchitic changes. No pneumonia, CHF, nor other acute cardiopulmonary abnormality.  Labs ordered 11/13/2016  Allergy profile   Sinus CT 11/13/2016 Sinus CT 11/13/2016 Mucosal edema and retention cyst in the maxillary sinus bilaterally left greater than righ     Assessment & Plan:

## 2016-11-13 NOTE — Patient Instructions (Addendum)
Try prilosec otc 20mg   Take 30-60 min before first meal of the day and Pepcid ac (famotidine) 20 mg one @  bedtime until cough is completely gone for at least a week without the need for cough suppression - you can do this again next time you cough to see if it makes the cough go away faster       GERD (REFLUX)  is an extremely common cause of respiratory symptoms just like yours , many times with no obvious heartburn at all.    It can be treated with medication, but also with lifestyle changes including elevation of the head of your bed (ideally with 6 inch  bed blocks),  Smoking cessation, avoidance of late meals, excessive alcohol, and avoid fatty foods, chocolate, peppermint, colas, red wine, and acidic juices such as orange juice.  NO MINT OR MENTHOL PRODUCTS SO NO COUGH DROPS   USE SUGARLESS CANDY INSTEAD (Jolley ranchers or Stover's or Life Savers) or even ice chips will also do - the key is to swallow to prevent all throat clearing. NO OIL BASED VITAMINS - use powdered substitutes.  Take delsym two tsp every 12 hours and supplement if needed with  tramadol 50 mg up to 1-2 every 4 hours to suppress the urge to cough. Swallowing water or using ice chips/non mint and menthol containing candies (such as lifesavers or sugarless jolly ranchers) are also effective.  You should rest your voice and avoid activities that you know make you cough.  Once you have eliminated the cough for 3 straight days try reducing the tramadol first,  then the delsym as tolerated.      Augmentin 875 mg take one pill twice daily  X 10 days - take at breakfast and supper with large glass of water.  It would help reduce the usual side effects (diarrhea and yeast infections) if you ate cultured yogurt at lunch.   Prednisone 10 mg take  4 each am x 2 days,   2 each am x 2 days,  1 each am x 2 days and stop   Please see patient coordinator before you leave today  to schedule sinus CT  Please remember to go to the lab  and x-ray department downstairs for your tests - we will call you with the results when they are available.     Return in 2 weeks if not 100% better

## 2016-11-14 ENCOUNTER — Other Ambulatory Visit: Payer: Self-pay | Admitting: Internal Medicine

## 2016-11-14 DIAGNOSIS — J341 Cyst and mucocele of nose and nasal sinus: Secondary | ICD-10-CM | POA: Insufficient documentation

## 2016-11-14 LAB — CBC WITH DIFFERENTIAL/PLATELET
Basophils Absolute: 0 10*3/uL (ref 0.0–0.1)
Basophils Relative: 0.3 % (ref 0.0–3.0)
Eosinophils Absolute: 0.6 10*3/uL (ref 0.0–0.7)
Eosinophils Relative: 7.4 % — ABNORMAL HIGH (ref 0.0–5.0)
HCT: 42.1 % (ref 39.0–52.0)
Hemoglobin: 14.6 g/dL (ref 13.0–17.0)
Lymphocytes Relative: 26.4 % (ref 12.0–46.0)
Lymphs Abs: 2 10*3/uL (ref 0.7–4.0)
MCHC: 34.6 g/dL (ref 30.0–36.0)
MCV: 89.2 fl (ref 78.0–100.0)
Monocytes Absolute: 0.6 10*3/uL (ref 0.1–1.0)
Monocytes Relative: 7.7 % (ref 3.0–12.0)
Neutro Abs: 4.4 10*3/uL (ref 1.4–7.7)
Neutrophils Relative %: 58.2 % (ref 43.0–77.0)
Platelets: 176 10*3/uL (ref 150.0–400.0)
RBC: 4.72 Mil/uL (ref 4.22–5.81)
RDW: 13.5 % (ref 11.5–15.5)
WBC: 7.6 10*3/uL (ref 4.0–10.5)

## 2016-11-14 LAB — RESPIRATORY ALLERGY PROFILE REGION II ~~LOC~~
Allergen, A. alternata, m6: 3.82 kU/L — ABNORMAL HIGH
Allergen, C. Herbarum, M2: <0.1 kU/L
Allergen, Cedar tree, t12: 0.27 kU/L — ABNORMAL HIGH
Allergen, Comm Silver Birch, t9: 0.33 kU/L — ABNORMAL HIGH
Allergen, Cottonwood, t14: 0.36 kU/L — ABNORMAL HIGH
Allergen, D pternoyssinus,d7: 2.14 kU/L — ABNORMAL HIGH
Allergen, Mouse Urine Protein, e78: <0.1 kU/L
Allergen, Mulberry, t76: <0.1 kU/L
Allergen, Oak,t7: 0.46 kU/L — ABNORMAL HIGH
Allergen, P. notatum, m1: 0.36 kU/L — ABNORMAL HIGH
Aspergillus fumigatus, m3: 0.21 kU/L — ABNORMAL HIGH
Bermuda Grass: 1.13 kU/L — ABNORMAL HIGH
Box Elder IgE: 0.42 kU/L — ABNORMAL HIGH
Cat Dander: <0.1 kU/L
Cockroach: 0.38 kU/L — ABNORMAL HIGH
Common Ragweed: 5.16 kU/L — ABNORMAL HIGH
D. farinae: 1.56 kU/L — ABNORMAL HIGH
Dog Dander: <0.1 kU/L
Elm IgE: 0.4 kU/L — ABNORMAL HIGH
IgE (Immunoglobulin E), Serum: 456 kU/L — ABNORMAL HIGH (ref <115)
Johnson Grass: 2.05 kU/L — ABNORMAL HIGH
Pecan/Hickory Tree IgE: 0.82 kU/L — ABNORMAL HIGH
Rough Pigweed  IgE: 0.46 kU/L — ABNORMAL HIGH
Sheep Sorrel IgE: 0.48 kU/L — ABNORMAL HIGH
Timothy Grass: 5.6 kU/L — ABNORMAL HIGH

## 2016-11-14 NOTE — Progress Notes (Signed)
Spoke with pt and notified of results per Dr. Wert. Pt verbalized understanding and denied any questions. 

## 2016-11-14 NOTE — Assessment & Plan Note (Signed)
Body mass index is 33.11  Lab Results  Component Value Date   TSH 1.36 10/25/2012     Contributing to gerd tendency/ doe/reviewed the need and the process to achieve and maintain neg calorie balance > defer f/u primary care including intermittently monitoring thyroid status

## 2016-11-14 NOTE — Assessment & Plan Note (Addendum)
Sinus CT 11/13/2016 Mucosal edema and retention cyst in the maxillary sinus bilaterally left greater than right.> refer to ENT - Allergy profile 11/13/2016 >  Eos 0. /  IgE   - Spirometry 11/13/2016  wnl with no curvature on FV loop    The most common causes of chronic cough in immunocompetent adults include the following: upper airway cough syndrome (UACS), previously referred to as postnasal drip syndrome (PNDS), which is caused by variety of rhinosinus conditions; (2) asthma; (3) GERD; (4) chronic bronchitis from cigarette smoking or other inhaled environmental irritants; (5) nonasthmatic eosinophilic bronchitis; and (6) bronchiectasis.   These conditions, singly or in combination, have accounted for up to 94% of the causes of chronic cough in prospective studies.   Other conditions have constituted no >6% of the causes in prospective studies These have included bronchogenic carcinoma, chronic interstitial pneumonia, sarcoidosis, left ventricular failure, ACEI-induced cough, and aspiration from a condition associated with pharyngeal dysfunction.    Chronic cough is often simultaneously caused by more than one condition. A single cause has been found from 38 to 82% of the time, multiple causes from 18 to 62%. Multiply caused cough has been the result of three diseases up to 42% of the time.      This is verly clearly Upper airway cough syndrome (previously labeled PNDS) , is  so named because it's frequently impossible to sort out how much is  CR/sinusitis with freq throat clearing (which can be related to primary GERD)   vs  causing  secondary (" extra esophageal")  GERD from wide swings in gastric pressure that occur with throat clearing, often  promoting self use of mint and menthol lozenges that reduce the lower esophageal sphincter tone and exacerbate the problem further in a cyclical fashion.   These are the same pts (now being labeled as having "irritable larynx syndrome" by some cough  centers) who not infrequently have a history of having failed to tolerate ace inhibitors,  dry powder inhalers or biphosphonates or report having atypical/extraesophageal reflux symptoms that don't respond to standard doses of PPI  and are easily confused as having aecopd or asthma flares by even experienced allergists/ pulmonologists (myself included).   For now needs 1) refer to ENT re large retention cysts and "zyrtec D dep 2) allergy profile 3) Cyclical cough protocol with heavy cough suppression just in short run combined with max gerd rx  Regroup p holidays if not all better  Total time devoted to counseling  > 50 % of office visit:  review case with pt/ discussion of options/alternatives/ personally creating written customized instructions  in presence of pt  then going over those specific  Instructions directly with the pt including how to use all of the meds but in particular covering each new medication in detail and the difference between the maintenance/automatic meds and the prns using an action plan format for the latter.  Please see AVS from this visit for a full list of these instructions

## 2016-11-17 ENCOUNTER — Other Ambulatory Visit: Payer: Self-pay

## 2016-11-17 DIAGNOSIS — I2510 Atherosclerotic heart disease of native coronary artery without angina pectoris: Secondary | ICD-10-CM

## 2016-11-17 DIAGNOSIS — I255 Ischemic cardiomyopathy: Secondary | ICD-10-CM
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
## 2016-11-26 ENCOUNTER — Ambulatory Visit (INDEPENDENT_AMBULATORY_CARE_PROVIDER_SITE_OTHER): Payer: Medicare Other | Admitting: Family Medicine

## 2016-11-26 ENCOUNTER — Encounter: Payer: Self-pay | Admitting: Family Medicine

## 2016-11-26 ENCOUNTER — Ambulatory Visit (INDEPENDENT_AMBULATORY_CARE_PROVIDER_SITE_OTHER)
Admission: RE | Admit: 2016-11-26 | Discharge: 2016-11-26 | Disposition: A | Discharge status: Home / Self Care | Payer: Medicare Other | Source: Ambulatory Visit | Attending: Family Medicine | Admitting: Family Medicine

## 2016-11-26 VITALS — BP 144/80 | HR 80 | Ht 71.0 in | Wt 234.0 lb

## 2016-11-26 DIAGNOSIS — M5442 Lumbago with sciatica, left side: Secondary | ICD-10-CM | POA: Diagnosis not present

## 2016-11-26 DIAGNOSIS — G8929 Other chronic pain: Secondary | ICD-10-CM

## 2016-11-26 DIAGNOSIS — M5136 Other intervertebral disc degeneration, lumbar region: Secondary | ICD-10-CM | POA: Diagnosis not present

## 2016-11-26 MED ORDER — GABAPENTIN 100 MG PO CAPS: 200.0000 mg | ORAL_CAPSULE | Freq: Every day | ORAL | 3 refills | Status: DC

## 2016-11-26 MED ORDER — KETOROLAC TROMETHAMINE 60 MG/2ML IM SOLN
60.0000 mg | Freq: Once | INTRAMUSCULAR | Status: AC
  Administered 2016-11-26: 60 mg via INTRAMUSCULAR

## 2016-11-26 MED ORDER — METHYLPREDNISOLONE ACETATE 80 MG/ML IJ SUSP
80.0000 mg | Freq: Once | INTRAMUSCULAR | Status: AC
  Administered 2016-11-26: 80 mg via INTRAMUSCULAR

## 2016-11-26 NOTE — Patient Instructions (Signed)
Good to see you I am sorry for your loss.  Ice is your friend.  Ice 20 minutes 2 times daily. Usually after activity and before bed. 2 injections today  Gabapentin 100-200mg  at night Exercises 3 times a week.  Xray downstairs today  See me again in 2 weeks but if not better call in 1 week and I will call in prednisone for you.  Happy New Year!

## 2016-11-26 NOTE — Assessment & Plan Note (Signed)
Patient is more of a degenerative disc disease and lumbar spine known. X-rays ordered today due to the acute change and treatment. Discussed with patient's recently and was given 2 injections to help with the inflammation. Home exercises given. The icing protocol. Gabapentin at night. Follow-up again in 2 weeks. Worsening symptoms seek medical attention.

## 2016-12-09 ENCOUNTER — Encounter: Payer: Self-pay | Admitting: Internal Medicine

## 2016-12-09 ENCOUNTER — Ambulatory Visit (INDEPENDENT_AMBULATORY_CARE_PROVIDER_SITE_OTHER): Payer: Medicare HMO | Admitting: Internal Medicine

## 2016-12-09 VITALS — BP 128/70 | HR 92 | Ht 71.0 in | Wt 229.6 lb

## 2016-12-09 DIAGNOSIS — R05 Cough: Secondary | ICD-10-CM

## 2016-12-09 DIAGNOSIS — R0982 Postnasal drip: Secondary | ICD-10-CM | POA: Diagnosis not present

## 2016-12-09 DIAGNOSIS — R058 Other specified cough: Secondary | ICD-10-CM

## 2016-12-09 LAB — NITRIC OXIDE: Nitric Oxide: 23

## 2016-12-09 MED ORDER — AZELASTINE-FLUTICASONE 137-50 MCG/ACT NA SUSP: 1.0000 | Freq: Two times a day (BID) | NASAL | 1 refills | Status: DC

## 2016-12-09 MED ORDER — PANTOPRAZOLE SODIUM 40 MG PO TBEC: 40.0000 mg | DELAYED_RELEASE_TABLET | Freq: Every day | ORAL | 2 refills | Status: DC

## 2016-12-09 MED ORDER — PREDNISONE 10 MG PO TABS
ORAL_TABLET | ORAL | 0 refills | Status: DC
Start: 2016-12-09 — End: 2016-12-23

## 2016-12-09 MED ORDER — AMOXICILLIN-POT CLAVULANATE 875-125 MG PO TABS: 1.0000 | ORAL_TABLET | Freq: Two times a day (BID) | ORAL | 0 refills | Status: AC

## 2016-12-09 MED ORDER — MONTELUKAST SODIUM 10 MG PO TABS: 10.0000 mg | ORAL_TABLET | Freq: Every day | ORAL | 11 refills | Status: DC

## 2016-12-09 NOTE — Assessment & Plan Note (Addendum)
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 left greater than right.> refer to ENT> eval by Constance Holster 11/18/2016 rec ns irrigation - no f/u for cysts needed  - Allergy profile 11/13/2016 >  Eos 0.6 /  IgE  456 Rast pos ragweed, grass, trees, dust - Spirometry 11/13/2016  wnl with no curvature on FV loop  - FENO 12/09/2016  =   26 no maint rx > added singulair 10 mg / dysmista one bid   Lack of cough resolution on a verified empirical regimen(which I can't confirm today as he is shaky on med reconciliation)  could mean an alternative diagnosis(cough variant asthma - seems unlikely with feno so low despite pos allergy studies), persistence of the disease state (eg sinusitis- already ruled out or bronchiectasis) , or inadequacy of currently available therapy (eg no medical rx available for non-acid gerd for which he is clearly at risk based on body habitus) or combination of the above, knowing recurrent cough x almost 40 years is certainly likely to be due to multiple factors  For now rec trial of more aggressive rx for rhinitis with dymista/singulair and add another cycle of augmentin / pred empirically as seemed to help the first time and max rx for gerd to get 24 h rx    I had an extended discussion with the patient reviewing all relevant studies completed to date and  lasting 25 minutes of a 40  minute office visit re refractory / recurrent/ chronic non-specific but potentially very serious pulmonary symptoms of unknown etiology.  The standardized cough guidelines published in Chest by Lissa Morales in 2006 are still the best available and consist of a multiple step process (up to 12!) , not a single office visit,  and are intended  to address this problem logically,  with an alogrithm dependent on response to empiric treatment at  each progressive step  to determine a specific diagnosis with  minimal addtional testing needed. Therefore if adherence is an issue or  can't be accurately verified,  it's very unlikely the standard evaluation and treatment will be successful here.    Furthermore, response to therapy (other than acute cough suppression, which should only be used short term with avoidance of narcotic containing cough syrups if possible), can be a gradual process for which the patient is not likely to  perceive immediate benefit.  Unlike going to an eye doctor where the best perscription is almost always the first one and is immediately effective, this is almost never the case in the management of chronic cough syndromes. Therefore the patient needs to commit up front to consistently adhere to recommendations  for up to 6 weeks of therapy directed at the likely underlying problem(s) before the response can be reasonably evaluated.   First step is with all meds in hand using a trust but verify approach to confirm accurate Medication  Reconciliation The principal here is that until we are certain that the  patients are doing what we've asked, it makes no sense to ask them to do more.   Each maintenance medication was reviewed in detail including most importantly the difference between maintenance and prns and under what circumstances the prns are to be triggered using an action plan format that is not reflected in the computer generated alphabetically organized AVS.    Please see AVS for specific instructions unique to this office visit that I personally wrote and verbalized to the the pt in detail and then  reviewed with pt  by my nurse highlighting any  changes in therapy recommended at today's visit to their plan of care.

## 2016-12-09 NOTE — Patient Instructions (Addendum)
Augmentin 875 mg take one pill twice daily  X 10 days - take at breakfast and supper with large glass of water.  It would help reduce the usual side effects (diarrhea and yeast infections) if you ate cultured yogurt at lunch.   dymista one twice daily each nostril until return   Singulair(montelukast) 10 mg one daily   Prednisone 10 mg take  4 each am x 2 days,   2 each am x 2 days,  1 each am x 2 days and stop   Pantoprazole (protonix) 40 mg (prilosec)    Take  30-60 min before first meal of the day and Pepcid (famotidine)  20 mg one @  bedtime until return to office - this is the best way to tell whether stomach acid is contributing to your problem.     Please schedule a follow up office visit in 2 weeks, sooner if needed  - please bring all active meds but separate them in 2 bags, the automatics vs the as needed

## 2016-12-09 NOTE — Progress Notes (Signed)
Subjective:    Patient ID: [name], male    DOB: November 18, 1941,    MRN: [id number]    Brief patient profile:  46 yowm quit smoking in 1984 with h/o seasonal rhinitis mostly fall / previously rx [facility] allergy x 3 year in teens but never free of need for and since 80s tendency to bronchitis that improved p quit and usually just has it every other year or two and typcially req office visits to get rid of but around 1st Nov 2017 similar onset but partial response to rx by Dr Janeice Robinson assoc With new chest discomfort with coughing and peristent yellow mucus so referred to pulmonary clinic 11/13/2016 by Dr Mancel Bale     History of Present Illness  11/13/2016 1st [facility] office visit/ [name]   Chief Complaint  Patient presents with  . Pulmonary Consult    Self referral. Pt c/o cough x 4-5 wks. Cough is prod with almond colored sputum.  Cough sometimes wakes him up at night.  He also c/o "lung pain" bilaterally.    chest discomfort only lasts up to 30 sec seems better if shifts positions/ bilateral post and not well localized or pleuritic. Prev eval by ENT at [facility] but no f/u since > since  Surgery ? A decade prior to OV  rx zyrtec D every day and if misses a dose "all stopped up"   Cough is worse at hs and in morning hours / mostly sob when coughing > almond color  rec Try prilosec otc 20mg   Take 30-60 min before first meal of the day and Pepcid ac (famotidine) 20 mg one @  bedtime until cough is completely gone for at least a week without the need for cough suppression - you can do this again next time you cough to see if it makes the cough go away faster  GERD  Take delsym two tsp every 12 hours and supplement if needed with  tramadol 50 mg up to 1-2 every 4 hours to suppress the urge to cough.     Augmentin 875 mg take one pill twice daily  X 10 days - take at breakfast and supper with large glass of water.  It would help reduce the usual side effects (diarrhea and yeast  infections) if you ate cultured yogurt at lunch.  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 Acute Visit    bronchitic, brown sputum, has been taking Claitin D but feels some mucus going down in his chest, coughing up brown mucus   75% improvement / still had some cough x one hour p waking /stirring gradually worse since  off prednisone with constant daytime sensation of nose/ throat and chest "congestion"  Easily confused with meds/ names/ how taken   No obvious day to day or daytime variability or assoc excess/ purulent sputum or mucus plugs or hemoptysis or cp or chest tightness, subjective wheeze or overt  hb symptoms. No unusual exp hx or h/o childhood pna/ asthma or knowledge of premature birth.  Sleeping ok without nocturnal  or early am exacerbation  of respiratory  c/o's or need for noct saba. Also denies any obvious fluctuation of symptoms with weather or environmental changes or other aggravating or alleviating factors except as outlined above   Current Medications, Allergies, Complete Past Medical History, Past Surgical History, Family History, and Social History were reviewed in [employer] record.  ROS  The following are not active complaints unless bolded sore throat, dysphagia, dental problems, itching, sneezing,  nasal congestion or excess/ purulent secretions, ear ache,   fever, chills, sweats, unintended wt loss, classically pleuritic or exertional cp,  orthopnea pnd or leg swelling, presyncope, palpitations, abdominal pain, anorexia, nausea, vomiting, diarrhea  or change in bowel or bladder habits, change in  stools or urine, dysuria,hematuria,  rash, arthralgias, visual complaints, headache, numbness, weakness or ataxia or problems with walking or coordination,  change in mood/affect or memory.             Objective:   Physical Exam    amb wm nad/ freq throat clearing /pseudowheezing  12/09/2016        229  11/13/16 237 lb 6.4 oz (107.7 kg)  04/07/16 237 lb 1.9 oz (107.6 kg)  12/20/15 237 lb (107.5 kg)    Vital signs reviewed  - Note on arrival 02 sats  95% on RA     HEENT: nl dentition, turbinates, and oropharynx which is pristine . Nl external ear canals without cough reflex   NECK :  without JVD/Nodes/TM/ nl carotid upstrokes bilaterally   LUNGS: no acc muscle use,  Nl contour chest with prominent pseudowheeze eliminated with plm   CV:  RRR  no s3 or murmur or increase in P2, nad no edema   ABD:  Tensely obese with nl inspiratory excursion in the supine position. No bruits or organomegaly appreciated, bowel sounds nl  MS:  Nl gait/ ext warm without deformities, calf tenderness, cyanosis or clubbing No obvious joint restrictions   SKIN: warm and dry without lesions    NEURO:  alert, approp, nl sensorium with  no motor or cerebellar deficits apparent.     CXR PA and Lateral:   11/13/2016 :    I personally reviewed images and agree with radiology impression as follows:   Mild chronic bronchitic changes. No pneumonia, CHF, nor other acute cardiopulmonary abnormality.     Sinus CT 11/13/2016 Sinus CT 11/13/2016 Mucosal edema and retention cyst in the maxillary sinus bilaterally left greater than righ     Assessment & Plan:

## 2016-12-09 NOTE — Assessment & Plan Note (Signed)
Body mass index is 32.02 trending down  Lab Results  Component Value Date   TSH 1.36 10/25/2012     Contributing to gerd tendency/ doe/reviewed the need and the process to achieve and maintain neg calorie balance > defer f/u primary care including intermittently monitoring thyroid status

## 2016-12-23 ENCOUNTER — Encounter: Payer: Self-pay | Admitting: Internal Medicine

## 2016-12-23 ENCOUNTER — Ambulatory Visit (INDEPENDENT_AMBULATORY_CARE_PROVIDER_SITE_OTHER): Payer: Medicare HMO | Admitting: Internal Medicine

## 2016-12-23 VITALS — BP 126/74 | HR 72 | Ht 71.0 in | Wt 234.0 lb

## 2016-12-23 DIAGNOSIS — R05 Cough: Secondary | ICD-10-CM | POA: Diagnosis not present

## 2016-12-23 DIAGNOSIS — R058 Other specified cough: Secondary | ICD-10-CM

## 2016-12-23 MED ORDER — AZELASTINE-FLUTICASONE 137-50 MCG/ACT NA SUSP: 1.0000 | Freq: Two times a day (BID) | NASAL | 11 refills | Status: DC

## 2016-12-23 NOTE — Assessment & Plan Note (Signed)
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 left greater than right.> refer to ENT> eval by Constance Holster 11/18/2016 rec ns irrigation - no f/u for cysts needed  - Allergy profile 11/13/2016 >  Eos 0.6 /  IgE  456 Rast pos ragweed, grass, trees, dust - Spirometry 11/13/2016  wnl with no curvature on FV loop  - FENO 12/09/2016  =   26 no maint rx > added singulair 10 mg / dysmista one bid  - improved 12/23/2016 so try off dymista   Improved to his satisfaction but still excessive throat clearing > advised   Now try off dymista to see if singulair really effective and if not refer to allergy  However, key here to longterm control of cough is doing accurate med reconcilation   To keep things simple, I have asked the patient to first separate medicines that are perceived as maintenance, that is to be taken daily "no matter what", from those medicines that are taken on only on an as-needed basis and I have given the patient examples of both, and then return to see our NP to generate a  detailed  medication calendar which should be followed until the next physician sees the patient and updates it.

## 2016-12-23 NOTE — Progress Notes (Signed)
Subjective:    Patient ID: [name], male    DOB: 10/25/1941,    MRN: [id number]    Brief patient profile:  22 yowm quit smoking in 1984 with h/o seasonal rhinitis mostly fall / previously rx HP allergy x 3 year in teens but never free of need for and since 80s tendency to bronchitis that improved p quit and usually just has it every other year or two and typcially req office visits to get rid of but around 1st Nov 2017 similar onset but partial response to rx by [name] With new chest discomfort with coughing and peristent yellow mucus so referred to pulmonary clinic 11/13/2016 by [name]     History of Present Illness  11/13/2016 1st [name] office visit/ [name]   Chief Complaint  Patient presents with  . Pulmonary Consult    Self referral. Pt c/o cough x 4-5 wks. Cough is prod with almond colored sputum.  Cough sometimes wakes him up at night.  He also c/o "lung pain" bilaterally.    chest discomfort only lasts up to 30 sec seems better if shifts positions/ bilateral post and not well localized or pleuritic. Prev eval by ENT at [facility] but no f/u since > since  Surgery ? A decade prior to OV  rx zyrtec D every day and if misses a dose "all stopped up"   Cough is worse at hs and in morning hours / mostly sob when coughing > almond color  rec Try prilosec otc 47m  Take 30-60 min before first meal of the day and Pepcid ac (famotidine) 20 mg one @  bedtime until cough is completely gone for at least a week without the need for cough suppression - you can do this again next time you cough to see if it makes the cough go away faster  GERD  Take delsym two tsp every 12 hours and supplement if needed with  tramadol 50 mg up to 1-2 every 4 hours to suppress the urge to cough.     Augmentin 875 mg take one pill twice daily  X 10 days - take at breakfast and supper with large glass of water.  It would help reduce the usual side effects (diarrhea and yeast  infections) if you ate cultured yogurt at lunch.  Prednisone 10 mg take  4 each am x 2 days,   2 each am x 2 days,  1 each am x 2 days and stop  Please see patient coordinator before you leave today  to schedule sinus CT> neg   Allergy profile 11/13/16 strongly pos seasonal allerges    12/09/2016  f/u ov/[name] re: chronic rhinitis x teen age years/ recurrent bronchitis since 1980s flare Nov 2017  Chief Complaint  Patient presents with  . Acute Visit    bronchitic, brown sputum, has been taking Claitin D but feels some mucus going down in his chest, coughing up brown mucus   75% improvement / still had some cough x one hour p waking /stirring gradually worse since  off prednisone with constant daytime sensation of nose/ throat and chest "congestion"  Easily confused with meds/ names/ how taken  rec Augmentin 875 mg take one pill twice daily  X 10 days   Dymista one twice daily each nostril until return  Singulair(montelukast) 10 mg one daily  Prednisone 10 mg take  4 each am x 2 days,   2 each am x 2 days,  1  each am x 2 days and stop  Pantoprazole (protonix) 40 mg (prilosec)    Take  30-60 min before first meal of the day and Pepcid (famotidine)  20 mg one @  bedtime until return to office - this is the best way to tell whether stomach acid is contributing to your problem.     12/23/2016  f/u ov/[name] re:  UACS better on singulair/dysmista/gerd rx/ still using menthol/oil based vits  Chief Complaint  Patient presents with  . Follow-up    Cough is much improved. He states still gets something in his throat that needs to come out- takes halls menthol lozenges to help with this.   daily sense of drainage down throat  30-45 min p rising > tan mucus clearer with augmentin No better on pred vs off  / denies seasonal variation on urge to clear throat but rhinitis def worse spring and fall despite zyrtec d daily Not limited by breathing from desired activities     No obvious day to day or daytime  variability or assoc   mucus plugs or hemoptysis or cp or chest tightness, subjective wheeze or overt sinus or hb symptoms. No unusual exp hx or h/o childhood pna/ asthma or knowledge of premature birth.  Sleeping ok without nocturnal  or early am exacerbation  of respiratory  c/o's or need for noct saba. Also denies any obvious fluctuation of symptoms with weather or environmental changes or other aggravating or alleviating factors except as outlined above   Current Medications, Allergies, Complete Past Medical History, Past Surgical History, Family History, and Social History were reviewed in [employer] record.  ROS  The following are not active complaints unless bolded sore throat, dysphagia, dental problems, itching, sneezing,  nasal congestion or excess/ purulent secretions, ear ache,   fever, chills, sweats, unintended wt loss, classically pleuritic or exertional cp,  orthopnea pnd or leg swelling, presyncope, palpitations, abdominal pain, anorexia, nausea, vomiting, diarrhea  or change in bowel or bladder habits, change in stools or urine, dysuria,hematuria,  rash, arthralgias, visual complaints, headache, numbness, weakness or ataxia or problems with walking or coordination,  change in mood/affect or memory.              Objective:   Physical Exam    amb wm nad nad / rarely clearing throat now  12/23/2016      234 12/09/2016        229  11/13/16 237 lb 6.4 oz (107.7 kg)  04/07/16 237 lb 1.9 oz (107.6 kg)  12/20/15 237 lb (107.5 kg)    Vital signs reviewed    - Note on arrival 02 sats  97% on RA     HEENT: nl dentition,   and oropharynx which continues to be pristine . Nl external ear canals without cough reflex- mild/moderate bilateral non-specific turbinate edema     NECK :  without JVD/Nodes/TM/ nl carotid upstrokes bilaterally   LUNGS: no acc muscle use,  Nl contour chest with clear lungs/ no wheeze pseudo or otherwise  CV:  RRR  no s3 or murmur or  increase in P2, nad no edema   ABD:  Tensely obese with nl inspiratory excursion in the supine position. No bruits or organomegaly appreciated, bowel sounds nl  MS:  Nl gait/ ext warm without deformities, calf tenderness, cyanosis or clubbing No obvious joint restrictions   SKIN: warm and dry without lesions    NEURO:  alert, approp, nl sensorium with  no motor or  cerebellar deficits apparent.     CXR PA and Lateral:   11/13/2016 :    I personally reviewed images and agree with radiology impression as follows:   Mild chronic bronchitic changes. No pneumonia, CHF, nor other acute cardiopulmonary abnormality.     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

## 2016-12-23 NOTE — Patient Instructions (Addendum)
Refill the singulair (montelukast) 10 mg and take each pm   Stop all oil based vitamins and all menthol products and just use candy or sips of water or ice to stop the throat clearing   Try off the dysmista and if much worse let me refer you to an allergist  See Tammy NP w/in 4  weeks with all your medications, even over the counter meds, separated in two separate bags, the ones you take no matter what vs the ones you stop once you feel better and take only as needed when you feel you need them.   Tammy  will generate for you a new user friendly medication calendar that will put [place] all on the same page re: your medication use.     Without this process, it simply isn't possible to assure that we are providing  your outpatient care  with  the attention to detail we feel you deserve.   If we cannot assure that you're getting that kind of care,  then we cannot manage your problem effectively from this clinic.  Once you have seen Tammy and we are sure that we're all on the same page with your medication use she will arrange follow up with me.  Late add:  Next step is trial of gabapentin 100 tid if still clearing throat

## 2016-12-23 NOTE — Assessment & Plan Note (Signed)
Body mass index is 32.64 still trending up   Lab Results  Component Value Date   TSH 1.36 10/25/2012     Contributing to gerd tendency/ doe/reviewed the need and the process to achieve and maintain neg calorie balance > defer f/u primary care including intermittently monitoring thyroid status

## 2016-12-31 ENCOUNTER — Ambulatory Visit (INDEPENDENT_AMBULATORY_CARE_PROVIDER_SITE_OTHER): Payer: Medicare HMO

## 2016-12-31 DIAGNOSIS — I255 Ischemic cardiomyopathy: Secondary | ICD-10-CM

## 2016-12-31 DIAGNOSIS — I2510 Atherosclerotic heart disease of native coronary artery without angina pectoris: Secondary | ICD-10-CM

## 2016-12-31 LAB — EXERCISE TOLERANCE TEST
Estimated workload: 10.4 METS
Exercise duration (min): 9 min
Exercise duration (sec): 0 s
MPHR: 147 {beats}/min
Peak BP: 200 mmHg
Peak HR: 136 {beats}/min
Percent HR: 92 %
Percent of predicted max HR: 92 %
RPE: 14
Rest HR: 77 {beats}/min
Stage 1 DBP: 89 mmHg
Stage 1 Grade: 0 %
Stage 1 HR: 77 {beats}/min
Stage 1 SBP: 131 mmHg
Stage 1 Speed: 0 mph
Stage 2 Grade: 0 %
Stage 2 HR: 80 {beats}/min
Stage 2 Speed: 1 mph
Stage 3 Grade: 0.1 %
Stage 3 HR: 80 {beats}/min
Stage 3 Speed: 1 mph
Stage 4 Grade: 10 %
Stage 4 HR: 99 {beats}/min
Stage 4 Speed: 1.7 mph
Stage 5 DBP: 71 mmHg
Stage 5 Grade: 12 %
Stage 5 HR: 116 {beats}/min
Stage 5 SBP: 164 mmHg
Stage 5 Speed: 2.5 mph
Stage 6 DBP: 76 mmHg
Stage 6 Grade: 14 %
Stage 6 HR: 136 {beats}/min
Stage 6 SBP: 200 mmHg
Stage 6 Speed: 3.5 mph
Stage 7 DBP: 74 mmHg
Stage 7 Grade: 0 %
Stage 7 HR: 127 {beats}/min
Stage 7 SBP: 188 mmHg
Stage 7 Speed: 1.5 mph
Stage 8 DBP: 75 mmHg
Stage 8 Grade: 0 %
Stage 8 HR: 91 {beats}/min
Stage 8 SBP: 167 mmHg
Stage 8 Speed: 0 mph

## 2017-01-19 ENCOUNTER — Encounter: Payer: Self-pay | Admitting: Cardiovascular Disease

## 2017-01-23 ENCOUNTER — Encounter: Payer: Self-pay | Admitting: Adult Health

## 2017-01-23 ENCOUNTER — Ambulatory Visit (INDEPENDENT_AMBULATORY_CARE_PROVIDER_SITE_OTHER): Payer: Medicare HMO | Admitting: Adult Health

## 2017-01-23 DIAGNOSIS — R058 Other specified cough: Secondary | ICD-10-CM

## 2017-01-23 DIAGNOSIS — R05 Cough: Secondary | ICD-10-CM | POA: Diagnosis not present

## 2017-01-23 NOTE — Progress Notes (Signed)
_0  ID: [name], male    DOB: Dec 19, 1940, 76 y.o.   MRN: [id number]  Chief Complaint  Patient presents with  . 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 Patient has been dealing with a chronic cough flare with suspected rhinitis and reflux trigger. He was started on Dymista, Singulair and PPI/pepcid , delysm regimen. Chest x-ray showed bronchitic changes in December 2017. CT sinus showed a maxillary retention cyst. Spirometry was normal. Exhaled nitric oxide was 26.  Allergy profile was positive for seasonal allergies, IgE was elevated at 456. Patient returns today with improvement in cough. He feels that he is improved. He does have a cough mainly that is in the morning time that seems to resolve after a few times of coughing up some thick mucus. He denies any hemoptysis, fever, orthopnea, PND, or increased leg swelling.    Allergies  Allergen Reactions  . Asa [Aspirin]     Only can tolerate low dose  . Statins Other (See Comments)    Aches     Immunization History  Administered Date(s) Administered  . Influenza Whole 12/31/2011, 08/31/2016  . Pneumococcal Polysaccharide-23 12/01/2008    Past Medical History:  Diagnosis Date  . Allergy   . Atrial fibrillation ([place])    persistent, failed DCCV;takes Metoprolol daily and taking Pacerone for 7days prior to surgery  . Bronchitis   . Cardiomyopathy, ischemic   . Complication of anesthesia    pt states he gets "crazy" after anesthesia  . Coronary artery disease   . Hay fever    takes Zyrtec daily  . History of bronchitis    last time a year ago  . History of kidney stones   . Hyperlipidemia    takes Zetia daily  . 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 Obesity (BMI 30-39.9)   . Pleural effusion, left 02/21/2013  . 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 S/P Maze operation for atrial fibrillation 01/27/2013   Complete bilateral lesion set using bipolar radiofrequency and cryothermy ablation with clipping of LA appendage  . Sleep apnea   . Type II diabetes mellitus (HCC)    takes Metformin and Januvia daily;pt states he is not a diabetic but pre diabetic    Tobacco History: History  Smoking Status  . Former Smoker  . Packs/day: 0.50  . Years: 32.00  . Types: Pipe, Cigarettes  . Quit date: 12/01/1982  Smokeless Tobacco  . Never Used    Comment: quit in 1980   Counseling given: Not Answered   Outpatient Encounter Prescriptions as of 01/23/2017  Medication Sig  .  Ascorbic Acid (VITAMIN C) 1000 MG tablet Take 1,000 mg by mouth daily.  Marland Kitchen aspirin EC 81 MG EC tablet Take 1 tablet (81 mg total) by mouth daily.  . Azelastine-Fluticasone (DYMISTA) 137-50 MCG/ACT SUSP Place 1 spray into the nose 2 (two) times daily.  . cetirizine-pseudoephedrine (ZYRTEC-D) 5-120 MG tablet Take 1 tablet by mouth 2 (two) times daily.  . Cholecalciferol (VITAMIN D-3) 1000 units CAPS Take 2,000 Units by mouth daily.  Marland Kitchen CINNAMON PO Take 1 capsule by mouth daily.  . cyanocobalamin 500 MCG tablet Take 500 mcg by mouth daily.  . famotidine (PEPCID) 20 MG tablet One at bedtime  . Glucosamine-Chondroit-Vit C-Mn (GLUCOSAMINE 1500 COMPLEX PO) Take 1 tablet by mouth  daily.  Marland Kitchen glucose blood (CHOICE DM FORA G20 TEST STRIPS) test strip Use as instructed  . glucose monitoring kit (FREESTYLE) monitoring kit 1 each by Does not apply route as needed for other.  . Lancets (ONETOUCH ULTRASOFT) lancets Use as instructed  . losartan (COZAAR) 25 MG tablet Take 1 tablet (25 mg total) by mouth daily.  . metFORMIN (GLUCOPHAGE) 500 MG tablet Take 250 mg by mouth 2 (two) times daily with a meal.   . metoprolol succinate (TOPROL-XL) 25 MG 24 hr tablet Take 25 mg by mouth daily.  . montelukast (SINGULAIR) 10 MG tablet Take 1 tablet (10 mg total) by mouth at bedtime.  . Multiple Vitamins-Minerals (MULTIVITAMIN WITH MINERALS) tablet Take 1 tablet by mouth daily.  . pantoprazole (PROTONIX) 40 MG tablet Take 1 tablet (40 mg total) by mouth daily. Take 30-60 min before first meal of the day  . triamcinolone cream (KENALOG) 0.1 % Apply 1 application topically 2 (two) times daily.   No facility-administered encounter medications on file as of 01/23/2017.      Review of Systems  Constitutional:   No  weight loss, night sweats,  Fevers, chills, fatigue, or  lassitude.  HEENT:   No headaches,  Difficulty swallowing,  Tooth/dental problems, or  Sore throat,                No sneezing, itching, ear ache, +nasal congestion, post nasal drip,   CV:  No chest pain,  Orthopnea, PND, swelling in lower extremities, anasarca, dizziness, palpitations, syncope.   GI  No heartburn, indigestion, abdominal pain, nausea, vomiting, diarrhea, change in bowel habits, loss of appetite, bloody stools.   Resp: No shortness of breath with exertion or at rest.  No excess mucus, no productive cough,  No non-productive cough,  No coughing up of blood.  No change in color of mucus.  No wheezing.  No chest wall deformity  Skin: no rash or lesions.  GU: no dysuria, change in color of urine, no urgency or frequency.  No flank pain, no hematuria   MS:  No joint pain or swelling.  No decreased range of  motion.  No back pain.    Physical Exam  BP 120/72 (BP Location: Right Arm, Cuff Size: Normal)   Pulse 73   Ht _0  (1.803 m)   Wt 237 lb 6.4 oz (107.7 kg)   SpO2 95%   BMI 33.11 kg/m   GEN: A/Ox3; pleasant , NAD, well nourished    HEENT:  [place]/AT,  EACs-clear, TMs-wnl, NOSE-clear drainage. THROAT-clear, no lesions, no postnasal drip or exudate noted. Class 2-3 MP airway   NECK:  Supple w/ fair ROM; no JVD; normal carotid impulses w/o bruits; no thyromegaly or nodules palpated; no lymphadenopathy.    RESP  Clear  P & A; w/o, wheezes/ rales/ or rhonchi. no accessory muscle use, no dullness to percussion  CARD:  RRR, no m/r/g, no peripheral edema, pulses intact, no cyanosis or clubbing.  GI:   Soft & nt; nml bowel sounds; no organomegaly or masses detected.   Musco: Warm bil, no deformities or joint swelling noted.   Neuro: alert, no focal deficits noted.    Skin: Warm, no lesions or rashes    Lab Results:  CBC    Component Value Date/Time   WBC 7.6 11/13/2016 0955   RBC 4.72 11/13/2016 0955   HGB 14.6 11/13/2016 0955   HCT 42.1 11/13/2016 0955   PLT 176.0 11/13/2016 0955   MCV 89.2 11/13/2016 0955   MCH 30.8 08/21/2013 0800   MCHC 34.6 11/13/2016 0955   RDW 13.5 11/13/2016 0955   LYMPHSABS 2.0 11/13/2016 0955   MONOABS 0.6 11/13/2016 0955   EOSABS 0.6 11/13/2016 0955   BASOSABS 0.0 11/13/2016 0955    BMET  Imaging: No results found.   Assessment & Plan:   Upper airway cough syndrome Recent flare with bronchitis w/ suspected AR /GERD triggers Continue to control for cough with delsym .   Plan  Patient Instructions  Continue on current regimen  Follow up with Dr. Melvyn Novas  In 2 months and As needed          Rexene Edison, NP 01/23/2017

## 2017-01-23 NOTE — Patient Instructions (Signed)
Continue on current regimen  Follow up with Dr. Wert  In 2 months and As needed   

## 2017-01-23 NOTE — Assessment & Plan Note (Signed)
Recent flare with bronchitis w/ suspected AR /GERD triggers Continue to control for cough with delsym .   Plan  Patient Instructions  Continue on current regimen  Follow up with Dr. Melvyn Novas  In 2 months and As needed

## 2017-01-24 NOTE — Progress Notes (Signed)
Chart and office note reviewed in detail s > agree with a/p as outlined   

## 2017-02-05 ENCOUNTER — Other Ambulatory Visit: Payer: Self-pay | Admitting: Internal Medicine

## 2017-02-12 ENCOUNTER — Telehealth: Payer: Self-pay | Admitting: Internal Medicine

## 2017-02-12 NOTE — Telephone Encounter (Signed)
Pt's insurance will not cover Pantoprazole. No covered alternatives were provided. PA has been started on Cover My Meds. Key: [id number].

## 2017-02-13 NOTE — Telephone Encounter (Signed)
The prior authorization for Pantoprazole Sodium 40MG  dr tablets is still awaiting determination via covermymeds.  PA Key [id number]

## 2017-03-02 NOTE — Telephone Encounter (Signed)
Blair Hailey was called for determination follow up at [phone]. Representative stated the medication is in tier one and did not need a PA. She also stated the patient picked up the medication on 02/28/17. Nothing further is needed.

## 2017-04-07 ENCOUNTER — Ambulatory Visit (INDEPENDENT_AMBULATORY_CARE_PROVIDER_SITE_OTHER): Payer: Medicare HMO | Admitting: Internal Medicine

## 2017-04-07 ENCOUNTER — Encounter: Payer: Self-pay | Admitting: Internal Medicine

## 2017-04-07 VITALS — BP 130/76 | HR 61 | Ht 71.0 in | Wt 230.0 lb

## 2017-04-07 DIAGNOSIS — R05 Cough: Secondary | ICD-10-CM | POA: Diagnosis not present

## 2017-04-07 DIAGNOSIS — R058 Other specified cough: Secondary | ICD-10-CM

## 2017-04-07 NOTE — Assessment & Plan Note (Signed)
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 left greater than right.> refer to ENT> eval by Constance Holster 11/18/2016 rec ns irrigation - no f/u for cysts needed  - Allergy profile 11/13/2016 >  Eos 0.6 /  IgE  456 Rast pos ragweed, grass, trees, dust - Spirometry 11/13/2016  wnl with no curvature on FV loop  - FENO 12/09/2016  =   26 no maint rx > added singulair 10 mg / dysmista one bid  - improved 12/23/2016 so try off dymista > flared off - 04/07/2017 referred to allergy/ [facility]/ [place]    I had an extended final summary discussion with the patient reviewing all relevant studies completed to date and  lasting 15 to 20 minutes of a 25 minute visit on the following issues:   Clearly has multiple mechanisms driving cough but none are pulmonary  Concerned over using decongestants chronically   Each maintenance medication was reviewed in detail including most importantly the difference between maintenance and as needed and under what circumstances the prns are to be used.  Please see AVS for specific  Instructions which are unique to this visit and I personally typed out  which were reviewed in detail in writing with the patient and a copy provided.

## 2017-04-07 NOTE — Progress Notes (Signed)
Subjective:    Patient ID: [name], male    DOB: 10/25/1941,    MRN: [id number]    Brief patient profile:  22 yowm quit smoking in 1984 with h/o seasonal rhinitis mostly fall / previously rx HP allergy x 3 year in teens but never free of need for and since 80s tendency to bronchitis that improved p quit and usually just has it every other year or two and typcially req office visits to get rid of but around 1st Nov 2017 similar onset but partial response to rx by [name] With new chest discomfort with coughing and peristent yellow mucus so referred to pulmonary clinic 11/13/2016 by [name]     History of Present Illness  11/13/2016 1st [name] office visit/ [name]   Chief Complaint  Patient presents with  . Pulmonary Consult    Self referral. Pt c/o cough x 4-5 wks. Cough is prod with almond colored sputum.  Cough sometimes wakes him up at night.  He also c/o "lung pain" bilaterally.    chest discomfort only lasts up to 30 sec seems better if shifts positions/ bilateral post and not well localized or pleuritic. Prev eval by ENT at [name] but no f/u since > since  Surgery ? A decade prior to OV  rx zyrtec D every day and if misses a dose "all stopped up"   Cough is worse at hs and in morning hours / mostly sob when coughing > almond color  rec Try prilosec otc 47m  Take 30-60 min before first meal of the day and Pepcid ac (famotidine) 20 mg one @  bedtime until cough is completely gone for at least a week without the need for cough suppression - you can do this again next time you cough to see if it makes the cough go away faster  GERD  Take delsym two tsp every 12 hours and supplement if needed with  tramadol 50 mg up to 1-2 every 4 hours to suppress the urge to cough.     Augmentin 875 mg take one pill twice daily  X 10 days - take at breakfast and supper with large glass of water.  It would help reduce the usual side effects (diarrhea and yeast  infections) if you ate cultured yogurt at lunch.  Prednisone 10 mg take  4 each am x 2 days,   2 each am x 2 days,  1 each am x 2 days and stop  Please see patient coordinator before you leave today  to schedule sinus CT> neg   Allergy profile 11/13/16 strongly pos seasonal allerges    12/09/2016  f/u ov/[name] re: chronic rhinitis x teen age years/ recurrent bronchitis since 1980s flare Nov 2017  Chief Complaint  Patient presents with  . Acute Visit    bronchitic, brown sputum, has been taking Claitin D but feels some mucus going down in his chest, coughing up brown mucus   75% improvement / still had some cough x one hour p waking /stirring gradually worse since  off prednisone with constant daytime sensation of nose/ throat and chest "congestion"  Easily confused with meds/ names/ how taken  rec Augmentin 875 mg take one pill twice daily  X 10 days   Dymista one twice daily each nostril until return  Singulair(montelukast) 10 mg one daily  Prednisone 10 mg take  4 each am x 2 days,   2 each am x 2 days,  1  each am x 2 days and stop  Pantoprazole (protonix) 40 mg (prilosec)    Take  30-60 min before first meal of the day and Pepcid (famotidine)  20 mg one @  bedtime until return to office - this is the best way to tell whether stomach acid is contributing to your problem.     12/23/2016  f/u ov/[name] re:  UACS better on singulair/dysmista/gerd rx/ still using menthol/oil based vits  Chief Complaint  Patient presents with  . Follow-up    Cough is much improved. He states still gets something in his throat that needs to come out- takes halls menthol lozenges to help with this.   daily sense of drainage down throat  30-45 min p rising > tan mucus clearer with augmentin No better on pred vs off  / denies seasonal variation on urge to clear throat but rhinitis def worse spring and fall despite zyrtec d daily Not limited by breathing from desired activities   rec Refill the singulair  (montelukast) 10 mg and take each pm  Stop all oil based vitamins and all menthol products and just use candy or sips of water or ice to stop the throat clearing  Try off the dysmista and if much worse let me refer you to an allergist> much worse off so resumed     04/07/2017  f/u ov/[name] re:  UACS with allergic rhintis on dymista once daily / montelukast nightly / protonix ac / pepcid hs  Chief Complaint  Patient presents with  . Follow-up    Breathing is overall doing well. His cough has resolved.    Not limited by breathing from desired activities  / coughs each am x up to 30 min x slt brown mucus min vol / during the day still urge to clear throat / can def tell if misses dose of Zyrtec D and zyrtec does not work   No obvious day to day or daytime variability or assoc  Production  mucus plugs or hemoptysis or cp or chest tightness, subjective wheeze or overt sinus or hb symptoms. No unusual exp hx or h/o childhood pna/ asthma or knowledge of premature birth.  Sleeping ok without nocturnal    exacerbation  of respiratory  c/o's or need for noct saba. Also denies any obvious fluctuation of symptoms with weather or environmental changes or other aggravating or alleviating factors except as outlined above   Current Medications, Allergies, Complete Past Medical History, Past Surgical History, Family History, and Social History were reviewed in [employer] record.  ROS  The following are not active complaints unless bolded sore throat, dysphagia, dental problems, itching, sneezing,  nasal congestion or excess/ purulent secretions, ear ache,   fever, chills, sweats, unintended wt loss, classically pleuritic or exertional cp,  orthopnea pnd or leg swelling, presyncope, palpitations, abdominal pain, anorexia, nausea, vomiting, diarrhea  or change in bowel or bladder habits, change in stools or urine, dysuria,hematuria,  rash, arthralgias, visual complaints, headache, numbness,  weakness or ataxia or problems with walking or coordination,  change in mood/affect or memory.             Objective:   Physical Exam    amb wm nad    04/07/2017        230  12/23/2016      234 12/09/2016        229  11/13/16 237 lb 6.4 oz (107.7 kg)  04/07/16 237 lb 1.9 oz (107.6 kg)  12/20/15 237  lb (107.5 kg)    Vital signs reviewed    - Note on arrival 02 sats  97% on RA     HEENT: nl dentition,   and oropharynx which continues to be pristine . Nl external ear canals without cough reflex- mild  bilateral non-specific turbinate edema     NECK :  without JVD/Nodes/TM/ nl carotid upstrokes bilaterally   LUNGS: no acc muscle use,  Nl contour chest with min end exp pseudowheeze, completely resolves with plm   CV:  RRR  no s3 or murmur or increase in P2, nad no edema   ABD:  Tensely obese with nl inspiratory excursion in the supine position. No bruits or organomegaly appreciated, bowel sounds nl  MS:  Nl gait/ ext warm without deformities, calf tenderness, cyanosis or clubbing No obvious joint restrictions   SKIN: warm and dry without lesions    NEURO:  alert, approp, nl sensorium with  no motor or cerebellar deficits apparent.     CXR PA and Lateral:   11/13/2016 :    I personally reviewed images and agree with radiology impression as follows:   Mild chronic bronchitic changes. No pneumonia, CHF, nor other acute cardiopulmonary abnormality.     Sinus CT 11/13/2016 Mucosal edema and retention cyst in the maxillary sinus bilaterally left greater than right     Assessment & Plan:   Outpatient Encounter Prescriptions as of 04/07/2017  Medication Sig  . Ascorbic Acid (VITAMIN C) 1000 MG tablet Take 1,000 mg by mouth daily.  Marland Kitchen aspirin EC 81 MG EC tablet Take 1 tablet (81 mg total) by mouth daily.  . Azelastine-Fluticasone (DYMISTA) 137-50 MCG/ACT SUSP Place 1 spray into the nose 2 (two) times daily.  . cetirizine-pseudoephedrine (ZYRTEC-D) 5-120 MG tablet Take 1 tablet by  mouth 2 (two) times daily.  . Cholecalciferol (VITAMIN D-3) 1000 units CAPS Take 2,000 Units by mouth daily.  Marland Kitchen CINNAMON PO Take 1 capsule by mouth daily.  . cyanocobalamin 500 MCG tablet Take 500 mcg by mouth daily.  . famotidine (PEPCID) 20 MG tablet One at bedtime  . Glucosamine-Chondroit-Vit C-Mn (GLUCOSAMINE 1500 COMPLEX PO) Take 1 tablet by mouth daily.  Marland Kitchen glucose blood (CHOICE DM FORA G20 TEST STRIPS) test strip Use as instructed  . glucose monitoring kit (FREESTYLE) monitoring kit 1 each by Does not apply route as needed for other.  . Lancets (ONETOUCH ULTRASOFT) lancets Use as instructed  . losartan (COZAAR) 25 MG tablet Take 1 tablet (25 mg total) by mouth daily.  . metFORMIN (GLUCOPHAGE) 500 MG tablet Take 250 mg by mouth 2 (two) times daily with a meal.   . metoprolol succinate (TOPROL-XL) 25 MG 24 hr tablet Take 25 mg by mouth daily.  . montelukast (SINGULAIR) 10 MG tablet Take 1 tablet (10 mg total) by mouth at bedtime.  . Multiple Vitamins-Minerals (MULTIVITAMIN WITH MINERALS) tablet Take 1 tablet by mouth daily.  . pantoprazole (PROTONIX) 40 MG tablet Take 1 tablet (40 mg total) by mouth daily. Take 30-60 min before first meal of the day  . triamcinolone cream (KENALOG) 0.1 % Apply 1 application topically 2 (two) times daily.   No facility-administered encounter medications on file as of 04/07/2017.

## 2017-04-07 NOTE — Assessment & Plan Note (Signed)
Body mass index is 32.08 kg/m.  -  trending down/ encouraged Lab Results  Component Value Date   TSH 1.36 10/25/2012     Contributing to gerd risk/ doe/reviewed the need and the process to achieve and maintain neg calorie balance > defer f/u primary care including intermittently monitoring thyroid status

## 2017-04-07 NOTE — Patient Instructions (Addendum)
Please see patient coordinator before you leave today  to schedule allergy eval by Dr Neldon Mc allergy eval but take all active medications with you   If you are satisfied with your treatment plan,  let your doctor know and he/she can either refill your medications or you can return here when your prescription runs out.     If in any way you are not 100% satisfied,  please tell us.  If 100% better, tell your friends!  Pulmonary follow up is as needed

## 2017-04-15 ENCOUNTER — Telehealth: Payer: Self-pay | Admitting: Cardiovascular Disease

## 2017-04-15 ENCOUNTER — Ambulatory Visit: Payer: Medicare Other | Admitting: Allergy & Immunology

## 2017-04-15 NOTE — Telephone Encounter (Signed)
New message    Pt is calling asking for Lauren to call him. He said it's about the papers he received after his stress test. He needs new copies.

## 2017-04-15 NOTE — Telephone Encounter (Signed)
Attempted to call the pt back and phone was busy.  Will route this message to Rawlins RN, for further review and follow-up with the pt.  Pt initially requested to speak with Lauren about paperwork.

## 2017-04-16 ENCOUNTER — Encounter: Payer: Self-pay | Admitting: Allergy and Immunology

## 2017-04-16 ENCOUNTER — Ambulatory Visit (INDEPENDENT_AMBULATORY_CARE_PROVIDER_SITE_OTHER): Payer: Medicare HMO | Admitting: Allergy and Immunology

## 2017-04-16 VITALS — BP 130/74 | HR 62 | Temp 98.2°F | Resp 20 | Ht 69.45 in | Wt 227.4 lb

## 2017-04-16 DIAGNOSIS — K219 Gastro-esophageal reflux disease without esophagitis: Secondary | ICD-10-CM

## 2017-04-16 DIAGNOSIS — H1045 Other chronic allergic conjunctivitis: Secondary | ICD-10-CM | POA: Diagnosis not present

## 2017-04-16 DIAGNOSIS — J3089 Other allergic rhinitis: Secondary | ICD-10-CM | POA: Diagnosis not present

## 2017-04-16 DIAGNOSIS — H1010 Acute atopic conjunctivitis, unspecified eye: Secondary | ICD-10-CM

## 2017-04-16 MED ORDER — PANTOPRAZOLE SODIUM 40 MG PO TBEC: 40.0000 mg | DELAYED_RELEASE_TABLET | Freq: Every day | ORAL | 3 refills | Status: AC

## 2017-04-16 MED ORDER — MONTELUKAST SODIUM 10 MG PO TABS: 10.0000 mg | ORAL_TABLET | Freq: Every day | ORAL | 3 refills | Status: AC

## 2017-04-16 MED ORDER — OLOPATADINE HCL 0.1 % OP SOLN: 1.0000 [drp] | Freq: Two times a day (BID) | OPHTHALMIC | 5 refills | Status: DC

## 2017-04-16 NOTE — Telephone Encounter (Signed)
I spoke with the pt and he requested that 01/19/17 letter done by Dr Burt Knack be faxed to [phone]. Letter faxed.

## 2017-04-16 NOTE — Patient Instructions (Addendum)
  1. Allergen avoidance measures  2. Continue anti-inflammatory therapy with Dymista and montelukast  3. Continue antireflux therapy with pantoprazole and famotidine  4. If needed may continue with Zyrtec-D and Claritin  5. If needed Patanol 1 drop each eye twice a day if needed  6. Consider starting a course of immunotherapy  7. Consider consolidating caffeine consumption  8. Return to clinic in 12 weeks or earlier if problem

## 2017-04-16 NOTE — Progress Notes (Signed)
Dear Dr. Mancel Bale,  Thank you for referring [name] to the [facility] on 04/16/2017.   Below is a summation of this patient's evaluation and recommendations.  Thank you for your referral. I will keep you informed about this patient's response to treatment.   If you have any questions please do not hesitate to contact me.   Sincerely,  Jiles Prows, MD Allergy / [facility]   ______________________________________________________________________    NEW PATIENT NOTE  Referring Provider: Lorene Dy, MD Primary Provider: Lorene Dy, MD Date of office visit: 04/16/2017    Subjective:   Chief Complaint:  [name] (DOB: [birth year]) is a 76 y.o. male who presents to the clinic on 04/16/2017 with a chief complaint of Allergies (runny nose, sneezing, PND) and Cough (productive, no wheezing, no shortness of breath) .     HPI: [name] presents to this clinic in evaluation of persistent respiratory tract symptoms dating back to childhood.  Apparently Cedric has a long history of allergic rhinoconjunctivitis with nasal congestion and sneezing and itchy eyes that developed around childhood for which he underwent a course of immunotherapy during his teenage years for approximately 3 years which did help his symptoms. However, over the course of the past several decades he has had return of these issues.  This past year has been particularly active regarding both his nose and his lower airways. He was apparently diagnosed with "bronchitis" on 2 occasions in 2017 and was treated with antibiotics and subsequently had evaluation with Dr. Melvyn Novas for this issue. His episodes of bronchitis would be manifested as coughing and phlegm. Apparently his wife was hospitalized during the winter and he had exposure to a hospital environment during that time and it was around this  point in time when he develops these episodes of bronchitis. Since he has had therapy administered by Dr. Melvyn Novas which includes anti-inflammatory medications for his upper airway and therapy directed against reflux he has done better but still has significant problems with his nose and eyes especially the spring. Even while utilizing medications he still remains symptomatic regarding his upper airway and eye issue. Outdoor exposure appears to precipitate some of these issues. He does wear a mask when he is mowing the grass.  His therapy for reflux certainly makes his classic reflux better and the issue with his throat better. He has less throat clearing and junk stuck in his throat and less need to clear out his throat in the morning but this is still an active issue even while using a proton pump inhibitor and a H1 receptor blocker. It should be noted that he drinks 8 cups of coffee per day.  Past Medical History:  Diagnosis Date  . Allergy   . Atrial fibrillation ([place])    persistent, failed DCCV;takes Metoprolol daily and taking Pacerone for 7days prior to surgery  . Bronchitis   . Cardiomyopathy, ischemic   . Complication of anesthesia    pt states he gets "crazy" after anesthesia  . Coronary artery disease   . Hay fever    takes Zyrtec daily  . History of bronchitis    last time a year ago  . History of kidney stones   . Hyperlipidemia    takes Zetia daily  . 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 Obesity (BMI 30-39.9)   . Pleural effusion,  left 02/21/2013  . 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 S/P Maze operation for atrial fibrillation 01/27/2013   Complete bilateral lesion set using bipolar radiofrequency and cryothermy ablation with clipping of LA appendage  . Sleep apnea   . Type II diabetes mellitus (HCC)    takes Metformin and Januvia daily;pt states he is not a diabetic but pre diabetic    Past  Surgical History:  Procedure Laterality Date  . ADENOIDECTOMY    . CARDIOVERSION  11/30/2012   Procedure: CARDIOVERSION;  Surgeon: Sherren Mocha, MD;  Location[facility]COPY;  Service: Cardiovascular;  Laterality: [name];  . CORONARY ANGIOPLASTY WITH STENT PLACEMENT  01/17/13 and in 2008   1 stent placed in 2008  . CORONARY ARTERY BYPASS GRAFT [name] 01/27/2013   Procedure: CORONARY ARTERY BYPASS GRAFTING (CABG) x  two; using left internal mammary artery and right leg greater saphenous vein harvested endoscopically;  Surgeon: Rexene Alberts, MD;  Location: [place];  Service: Open Heart Surgery;  Laterality: [name];  . CYSTOSCOPY    . HERNIA REPAIR  12/18/2009   Incarcerated umbilical hernia  . INTRAOPERATIVE TRANSESOPHAGEAL ECHOCARDIOGRAM [name] 01/27/2013   Procedure: INTRAOPERATIVE TRANSESOPHAGEAL ECHOCARDIOGRAM;  Surgeon: Rexene Alberts, MD;  Location: [place];  Service: Open Heart Surgery;  Laterality: [name];  . MAZE [name] 01/27/2013   Procedure: MAZE;  Surgeon: Rexene Alberts, MD;  Location: [place];  Service: Open Heart Surgery;  Laterality: [name];  . NASAL SEPTUM SURGERY  2004   and sinus repair penta  . SINOSCOPY  2000  . skin taken off of top of mouth and reapplied to gum    . TONSILLECTOMY  1952  . TONSILLECTOMY    . UMBILICAL HERNIA REPAIR  01/2010    Allergies as of 04/16/2017      Reactions   Asa [aspirin]    Only can tolerate low dose   Statins Other (See Comments)   Aches      Medication List      aspirin 81 MG EC tablet Take 1 tablet (81 mg total) by mouth daily.   Azelastine-Fluticasone 137-50 MCG/ACT Susp Commonly known as:  DYMISTA Place 1 spray into the nose 2 (two) times daily.   cetirizine-pseudoephedrine 5-120 MG tablet Commonly known as:  ZYRTEC-D Take 1 tablet by mouth 2 (two) times daily.   CINNAMON PO Take 1 capsule by mouth daily.   cyanocobalamin 500 MCG tablet Take 500 mcg by mouth daily.   famotidine 20 MG tablet Commonly known as:  PEPCID One at bedtime     GLUCOSAMINE 1500 COMPLEX PO Take 1 tablet by mouth daily.   glucose blood test strip Commonly known as:  CHOICE DM FORA G20 TEST STRIPS Use as instructed   glucose monitoring kit monitoring kit 1 each by Does not apply route as needed for other.   losartan 25 MG tablet Commonly known as:  COZAAR Take 1 tablet (25 mg total) by mouth daily.   metFORMIN 500 MG tablet Commonly known as:  GLUCOPHAGE Take 250 mg by mouth 2 (two) times daily with a meal.   metoprolol succinate 25 MG 24 hr tablet Commonly known as:  TOPROL-XL Take 25 mg by mouth daily.   montelukast 10 MG tablet Commonly known as:  SINGULAIR Take 1 tablet (10 mg total) by mouth at bedtime.   multivitamin with minerals tablet Take 1 tablet by mouth daily.   onetouch ultrasoft lancets Use as  instructed   pantoprazole 40 MG tablet Commonly known as:  PROTONIX Take 1 tablet (40 mg total) by mouth daily. Take 30-60 min before first meal of the day   triamcinolone cream 0.1 % Commonly known as:  KENALOG Apply 1 application topically 2 (two) times daily.   vitamin C 1000 MG tablet Take 1,000 mg by mouth daily.   Vitamin D-3 1000 units Caps Take 2,000 Units by mouth daily.       Review of systems negative except as noted in HPI / PMHx or noted below:  Review of Systems  Constitutional: Negative.   HENT: Negative.   Eyes: Negative.   Respiratory: Negative.   Cardiovascular: Negative.   Gastrointestinal: Negative.   Genitourinary: Negative.   Musculoskeletal: Negative.   Skin: Negative.   Neurological: Negative.   Endo/Heme/Allergies: Negative.   Psychiatric/Behavioral: Negative.     Family History  Problem Relation Age of Onset  . Heart disease Mother   . Diabetes Father   . Lung disease Father   . Cancer Father        colon    Social History   Social History  . Marital status: Married    Spouse name: [name]  . Number of children: [name]  . Years of education: [name]   Occupational History   . Not on file.   Social History Main Topics  . Smoking status: Former Smoker    Packs/day: 0.50    Years: 32.00    Types: Pipe, Cigarettes    Quit date: 12/01/1982  . Smokeless tobacco: Never Used     Comment: quit in 1980  . Alcohol use Yes     Comment: beer occasionally  . Drug use: No  . Sexual activity: Yes   Other Topics Concern  . Not on file   Social History Narrative  . No narrative on file    Environmental and Social history  Lives in a house with a dry environment, a dog located inside the household, carpeting in the bedroom, no plastic on the bed or pillow, and no smoking ongoing with inside the household. 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  Objective:   Vitals:   04/16/17 1025  BP: 130/74  Pulse: 62  Resp: 20  Temp: 98.2 F (36.8 C)   Height: 5' 9.45" (176.4 cm) Weight: 227 lb 6.4 oz (103.1 kg)  Physical Exam  Constitutional: He is well-developed, well-nourished, and in no distress.  HENT:  Head: Normocephalic. Head is without right periorbital erythema and without left periorbital erythema.  Right Ear: Tympanic membrane, external ear and ear canal normal.  Left Ear: Tympanic membrane, external ear and ear canal normal.  Nose: Mucosal edema present. No rhinorrhea.  Mouth/Throat: Uvula is midline, oropharynx is clear and moist and mucous membranes are normal. No oropharyngeal exudate.  Eyes: Conjunctivae and lids are normal. Pupils are equal, round, and reactive to light.  Neck: Trachea normal. No tracheal tenderness present. No tracheal deviation present. No thyromegaly present.  Cardiovascular: Normal rate, regular rhythm, S1 normal, S2 normal and normal heart sounds.   No murmur heard. Pulmonary/Chest: Effort normal and breath sounds normal. No stridor. No tachypnea. No respiratory distress. He has no wheezes. He has no rales. He exhibits no tenderness.  Abdominal: Soft. He exhibits no distension and no mass. There is no  hepatosplenomegaly. There is no tenderness. There is no rebound and no guarding.  Musculoskeletal: He exhibits no edema or tenderness.  Lymphadenopathy:  Head (right side): No tonsillar adenopathy present.       Head (left side): No tonsillar adenopathy present.    He has no cervical adenopathy.    He has no axillary adenopathy.  Neurological: He is alert. Gait normal.  Skin: No rash noted. He is not diaphoretic. No erythema. No pallor. Nails show no clubbing.  Psychiatric: Mood and affect normal.    Diagnostics: Allergy skin tests were performed. He demonstrated hypersensitivity against grasses, weeds, trees, Alternaria and other molds, and house dust mite  Results of blood tests obtained 11/13/2016 identified a white blood cell count of 7.6 with an absolute eosinophil count of 600, lymphocyte count 2000, hemoglobin 14.6, platelet 176. A aero allergen profile identified a IgE level of 456 KU/L, and IgE antibodies directed against house dust mite, grasses, trees, weeds, and molds.  Results of a sinus CT scan obtained 11/13/2016 identified the following:  Large retention cyst left maxillary sinus measuring 31 mm similar to the prior study. Mild areas of mucosal edema in the left maxillary sinus  19 mm retention cyst right maxillary sinus with associated mucosal edema.  Frontal and ethmoid and sphenoid sinuses clear.  Regional soft tissues negative.  Results of a chest x-ray obtained 11/13/2016 identified the following:  The lungs are adequately inflated. There is no focal infiltrate. There is no pleural effusion. The heart and pulmonary vascularity are normal.  There is a stable appearing left atrial appendage clip. The sternal wires from previous CABG are intact.  There is faint calcification in the wall of the thoracic aorta.  The observed bony thorax exhibits no acute abnormality.   Assessment and Plan:    1. Other allergic rhinitis   2. Seasonal allergic  conjunctivitis   3. LPRD (laryngopharyngeal reflux disease)     1. Allergen avoidance measures  2. Continue anti-inflammatory therapy with Dymista and montelukast  3. Continue antireflux therapy with pantoprazole and famotidine  4. If needed may continue with Zyrtec-D and Claritin  5. If needed Patanol 1 drop each eye twice a day if needed  6. Consider starting a course of immunotherapy  7. Consider consolidating caffeine consumption  8. Return to clinic in 12 weeks or earlier if problem  Mat appears to have significant atopic disease affecting his airway and conjunctiva that has not responded adequately to medical therapy and thus he is a candidate for immunotherapy. I made a recommendation that he start a course of immunotherapy and I have given him literature on this form of therapy during today's visit. He will attempt to perform allergen avoidance measures as best as possible and continue on anti-inflammatory agents for his respiratory tract and conjunctiva. As well, he does appear to have a history very consistent with LPR and he does appear to have significant reflux disease requiring multiple medications and I made a recommendation that he consider consolidating his rather significant caffeine consumption in an attempt to get his reflux under better control. I will see him back in this clinic in 12 weeks or earlier if there is a problem.  Jiles Prows, MD [facility]

## 2017-04-20 ENCOUNTER — Encounter: Payer: Self-pay | Admitting: Allergy and Immunology

## 2017-04-21 DIAGNOSIS — Z0100 Encounter for examination of eyes and vision without abnormal findings: Secondary | ICD-10-CM | POA: Diagnosis not present

## 2017-05-05 DIAGNOSIS — D485 Neoplasm of uncertain behavior of skin: Secondary | ICD-10-CM | POA: Diagnosis not present

## 2017-05-05 DIAGNOSIS — E1165 Type 2 diabetes mellitus with hyperglycemia: Secondary | ICD-10-CM | POA: Diagnosis not present

## 2017-05-06 DIAGNOSIS — E1165 Type 2 diabetes mellitus with hyperglycemia: Secondary | ICD-10-CM | POA: Diagnosis not present

## 2017-06-08 DIAGNOSIS — E1165 Type 2 diabetes mellitus with hyperglycemia: Secondary | ICD-10-CM | POA: Diagnosis not present

## 2017-06-08 DIAGNOSIS — D485 Neoplasm of uncertain behavior of skin: Secondary | ICD-10-CM | POA: Diagnosis not present

## 2017-06-09 DIAGNOSIS — E1165 Type 2 diabetes mellitus with hyperglycemia: Secondary | ICD-10-CM | POA: Diagnosis not present

## 2017-07-07 DIAGNOSIS — J029 Acute pharyngitis, unspecified: Secondary | ICD-10-CM | POA: Diagnosis not present

## 2017-07-07 DIAGNOSIS — H6503 Acute serous otitis media, bilateral: Secondary | ICD-10-CM | POA: Diagnosis not present

## 2017-07-13 ENCOUNTER — Ambulatory Visit (INDEPENDENT_AMBULATORY_CARE_PROVIDER_SITE_OTHER): Payer: Medicare HMO | Admitting: Family Medicine

## 2017-07-13 ENCOUNTER — Encounter: Payer: Self-pay | Admitting: Family Medicine

## 2017-07-13 ENCOUNTER — Ambulatory Visit: Payer: Self-pay

## 2017-07-13 VITALS — BP 122/82 | HR 82 | Ht 71.0 in | Wt 226.0 lb

## 2017-07-13 DIAGNOSIS — M79601 Pain in right arm: Secondary | ICD-10-CM | POA: Diagnosis not present

## 2017-07-13 DIAGNOSIS — S46219A Strain of muscle, fascia and tendon of other parts of biceps, unspecified arm, initial encounter: Secondary | ICD-10-CM | POA: Insufficient documentation

## 2017-07-13 DIAGNOSIS — M75121 Complete rotator cuff tear or rupture of right shoulder, not specified as traumatic: Secondary | ICD-10-CM

## 2017-07-13 NOTE — Patient Instructions (Signed)
Good to see you  Bicep tendon tear Compression sleeve would be great daily but not at night pennsaid pinkie amount topically 2 times daily as needed.   Ice 20 minutes 2 times daily. Usually after activity and before bed. See me again in 2 weeks.

## 2017-07-13 NOTE — Assessment & Plan Note (Signed)
Right-sided. This appeared to be possibly full-thickness. Discussed with patient about different treatment options. We discussed that this is likely only 15% of the strength. Patient will try conservative therapy with compression, icing, topical anti-inflammatories. Patient will return in 2 weeks. Worsening symptoms we'll need to consider further treatment options. Likely the patient will start to improve and pain but well unfortunately have the deformity lifelong

## 2017-07-13 NOTE — Progress Notes (Signed)
[name] [address], [place] [address] Phone: [phone] Subjective:    CC: Right arm pain  [id number]  [name] is a 76 y.o. male coming in with complaint of right arm pain. Patient does have a history of a partial muscle tear. Unfortunate patient was doing some lifting something heavy. Had an audible popping sensation with significant bruising and pain. This happened 2 days ago. Continues to have difficulty. Patient states that there is some mild weakness. Rates the severity pain is 6 out of 10. Has not tried any significant home modalities at this time     Past Medical History:  Diagnosis Date  . Allergy   . Atrial fibrillation ([place])    persistent, failed DCCV;takes Metoprolol daily and taking Pacerone for 7days prior to surgery  . Bronchitis   . Cardiomyopathy, ischemic   . Complication of anesthesia    pt states he gets "crazy" after anesthesia  . Coronary artery disease   . Hay fever    takes Zyrtec daily  . History of bronchitis    last time a year ago  . History of kidney stones   . Hyperlipidemia    takes Zetia daily  . Myocardial infarction [facility]) 2008   inferior wall, treated with BMS in RCA  . Obesity (BMI 30-39.9)   . Pleural effusion, left 02/21/2013  . 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 S/P Maze operation for atrial fibrillation 01/27/2013   Complete bilateral lesion set using bipolar radiofrequency and cryothermy ablation with clipping of LA appendage  . Sleep apnea   . Type II diabetes mellitus (HCC)    takes Metformin and Januvia daily;pt states he is not a diabetic but pre diabetic   Past Surgical History:  Procedure Laterality Date  . ADENOIDECTOMY    . CARDIOVERSION  11/30/2012   Procedure: CARDIOVERSION;  Surgeon: Sherren Mocha, MD;  Location: [facility] ENDOSCOPY;  Service: Cardiovascular;  Laterality: [name];  . CORONARY ANGIOPLASTY WITH STENT PLACEMENT  01/17/13 and  in 2008   1 stent placed in 2008  . CORONARY ARTERY BYPASS GRAFT [name] 01/27/2013   Procedure: CORONARY ARTERY BYPASS GRAFTING (CABG) x  two; using left internal mammary artery and right leg greater saphenous vein harvested endoscopically;  Surgeon: Rexene Alberts, MD;  Location: [place];  Service: Open Heart Surgery;  Laterality: [name];  . CYSTOSCOPY    . HERNIA REPAIR  12/18/2009   Incarcerated umbilical hernia  . INTRAOPERATIVE TRANSESOPHAGEAL ECHOCARDIOGRAM [name] 01/27/2013   Procedure: INTRAOPERATIVE TRANSESOPHAGEAL ECHOCARDIOGRAM;  Surgeon: Rexene Alberts, MD;  Location: [place];  Service: Open Heart Surgery;  Laterality: [name];  . MAZE [name] 01/27/2013   Procedure: MAZE;  Surgeon: Rexene Alberts, MD;  Location: [place];  Service: Open Heart Surgery;  Laterality: [name];  . NASAL SEPTUM SURGERY  2004   and sinus repair penta  . SINOSCOPY  2000  . skin taken off of top of mouth and reapplied to gum    . TONSILLECTOMY  1952  . TONSILLECTOMY    . UMBILICAL HERNIA REPAIR  01/2010   Social History   Social History  . Marital status: Married    Spouse name: [name]  . Number of children: [name]  . Years of education: [name]   Social History Main Topics  . Smoking status: Former Smoker    Packs/day: 0.50    Years: 32.00  Types: Pipe, Cigarettes    Quit date: 12/01/1982  . Smokeless tobacco: Never Used     Comment: quit in 1980  . Alcohol use Yes     Comment: beer occasionally  . Drug use: No  . Sexual activity: Yes   Other Topics Concern  . None   Social History Narrative  . None   Allergies  Allergen Reactions  . Asa [Aspirin]     Only can tolerate low dose  . Statins Other (See Comments)    Aches    Family History  Problem Relation Age of Onset  . Heart disease Mother   . Diabetes Father   . Lung disease Father   . Cancer Father        colon     Past medical history, social, surgical and family history all reviewed in electronic medical record.  No pertanent information unless  stated regarding to the chief complaint.   Review of Systems:Review of systems updated and as accurate as of 07/13/17  No headache, visual changes, nausea, vomiting, diarrhea, constipation, dizziness, abdominal pain, skin rash, fevers, chills, night sweats, weight loss, swollen lymph nodes, body aches, joint swelling, muscle aches, chest pain, shortness of breath, mood changes.   Objective  Blood pressure 122/82, pulse 82, height 5\' 11"  (1.803 m), weight 226 lb (102.5 kg), SpO2 94 %. Systems examined below as of 07/13/17   General: No apparent distress alert and oriented x3 mood and affect normal, dressed appropriately.  HEENT: Pupils equal, extraocular movements intact  Respiratory: Patient's speak in full sentences and does not appear short of breath  Cardiovascular: No lower extremity edema, non tender, no erythema  Skin: Warm dry intact with no signs of infection or rash on extremities or on axial skeleton.  Abdomen: Soft nontender  Neuro: Cranial nerves II through XII are intact, neurovascularly intact in all extremities with 2+ DTRs and 2+ pulses.  Lymph: No lymphadenopathy of posterior or anterior cervical chain or axillae bilaterally.  Gait normal with good balance and coordination.  MSK:  Non tender with full range of motion and good stability and symmetric strength and tone of  elbows, wrist, hip, knee and ankles bilaterally.  Shoulder: Right Patient does have a deformity of the bicep noted swelling and the medial aspect of the elbow Tender more over the distal aspect of the bicep ROM is full in all planes. Rotator cuff strength 4 out of 5 Positive impingement Speeds and Yergason's tests normal. No labral pathology noted with negative Obrien's, negative clunk and good stability. Normal scapular function observed. No painful arc and no drop arm sign. No apprehension sign Contralateral shoulder unremarkable  MSK US performed of: Right shoulder This study was ordered,  performed, and interpreted by Charlann Boxer D.O.  Shoulder:   Bicep tendon shows the patient actually does have a large tear noted and the distal muscular tendon juncture. Hypoechoic changes. No avulsion noted. Proximal biceps tendon also appears to be injuring unable to see if there is true retraction. Impression: Bicep tendon tear       Impression and Recommendations:     This case required medical decision making of moderate complexity.      Note: This dictation was prepared with Dragon dictation along with smaller phrase technology. Any transcriptional errors that result from this process are unintentional.

## 2017-07-13 NOTE — Assessment & Plan Note (Signed)
Has had tear previously.

## 2017-07-14 DIAGNOSIS — Z0000 Encounter for general adult medical examination without abnormal findings: Secondary | ICD-10-CM | POA: Diagnosis not present

## 2017-07-14 DIAGNOSIS — Z1389 Encounter for screening for other disorder: Secondary | ICD-10-CM | POA: Diagnosis not present

## 2017-07-14 DIAGNOSIS — Z131 Encounter for screening for diabetes mellitus: Secondary | ICD-10-CM | POA: Diagnosis not present

## 2017-07-14 DIAGNOSIS — E039 Hypothyroidism, unspecified: Secondary | ICD-10-CM | POA: Diagnosis not present

## 2017-07-14 DIAGNOSIS — I10 Essential (primary) hypertension: Secondary | ICD-10-CM | POA: Diagnosis not present

## 2017-07-14 DIAGNOSIS — Z79899 Other long term (current) drug therapy: Secondary | ICD-10-CM | POA: Diagnosis not present

## 2017-07-14 DIAGNOSIS — R42 Dizziness and giddiness: Secondary | ICD-10-CM | POA: Diagnosis not present

## 2017-07-14 DIAGNOSIS — R002 Palpitations: Secondary | ICD-10-CM | POA: Diagnosis not present

## 2017-07-14 DIAGNOSIS — R1032 Left lower quadrant pain: Secondary | ICD-10-CM | POA: Diagnosis not present

## 2017-07-14 DIAGNOSIS — Z1211 Encounter for screening for malignant neoplasm of colon: Secondary | ICD-10-CM | POA: Diagnosis not present

## 2017-07-14 DIAGNOSIS — I70219 Atherosclerosis of native arteries of extremities with intermittent claudication, unspecified extremity: Secondary | ICD-10-CM | POA: Diagnosis not present

## 2017-07-14 DIAGNOSIS — Z125 Encounter for screening for malignant neoplasm of prostate: Secondary | ICD-10-CM | POA: Diagnosis not present

## 2017-07-14 DIAGNOSIS — H9113 Presbycusis, bilateral: Secondary | ICD-10-CM | POA: Diagnosis not present

## 2017-07-14 DIAGNOSIS — E785 Hyperlipidemia, unspecified: Secondary | ICD-10-CM | POA: Diagnosis not present

## 2017-07-14 DIAGNOSIS — R5383 Other fatigue: Secondary | ICD-10-CM | POA: Diagnosis not present

## 2017-07-14 DIAGNOSIS — E7800 Pure hypercholesterolemia, unspecified: Secondary | ICD-10-CM | POA: Diagnosis not present

## 2017-07-27 ENCOUNTER — Encounter: Payer: Self-pay | Admitting: Family Medicine

## 2017-07-27 ENCOUNTER — Ambulatory Visit (INDEPENDENT_AMBULATORY_CARE_PROVIDER_SITE_OTHER): Payer: Medicare HMO | Admitting: Family Medicine

## 2017-07-27 ENCOUNTER — Ambulatory Visit: Payer: Self-pay

## 2017-07-27 VITALS — BP 120/72 | HR 58 | Wt 229.0 lb

## 2017-07-27 DIAGNOSIS — G8929 Other chronic pain: Secondary | ICD-10-CM

## 2017-07-27 DIAGNOSIS — M25511 Pain in right shoulder: Principal | ICD-10-CM

## 2017-07-27 DIAGNOSIS — S46219A Strain of muscle, fascia and tendon of other parts of biceps, unspecified arm, initial encounter: Secondary | ICD-10-CM

## 2017-07-27 NOTE — Patient Instructions (Signed)
Good to see you  I think you will do well overall but will take anoth 1-3 months.  Compression sleeve with a lot of activity  Ice after activity  Exercises 3 times a week.  OK to do your lifting but drop weight to 15 pounds and increast 5 pound a week.  If it hurts take a step back.  See me again in 3-4 weeks.

## 2017-07-27 NOTE — Progress Notes (Signed)
[name] [address], [place] [address] Phone: [phone] Subjective:    CC: Right arm pain f/u  [id number]  [name] is a 75 y.o. male coming in with complaint of right arm pain. Patient does have a history of a partial muscle tear. Patient was seen by me and unfortunately had what appeared to be a rotator cuff tear as well as a large bicep tear. Patient elected do conservative therapy. Patient was to get compression sleeve, topical anti-inflammatories and icing regimen. Patient states Mild improvement overall. Patient states that he lifts something heavy has worsening pain again. Patient states on the shoulder seems to be doing relatively better.    Past Medical History:  Diagnosis Date  . Allergy   . Atrial fibrillation ([place])    persistent, failed DCCV;takes Metoprolol daily and taking Pacerone for 7days prior to surgery  . Bronchitis   . Cardiomyopathy, ischemic   . Complication of anesthesia    pt states he gets "crazy" after anesthesia  . Coronary artery disease   . Hay fever    takes Zyrtec daily  . History of bronchitis    last time a year ago  . History of kidney stones   . Hyperlipidemia    takes Zetia daily  . Myocardial infarction [facility]) 2008   inferior wall, treated with BMS in RCA  . Obesity (BMI 30-39.9)   . Pleural effusion, left 02/21/2013  . 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 S/P Maze operation for atrial fibrillation 01/27/2013   Complete bilateral lesion set using bipolar radiofrequency and cryothermy ablation with clipping of LA appendage  . Sleep apnea   . Type II diabetes mellitus (HCC)    takes Metformin and Januvia daily;pt states he is not a diabetic but pre diabetic   Past Surgical History:  Procedure Laterality Date  . ADENOIDECTOMY    . CARDIOVERSION  11/30/2012   Procedure: CARDIOVERSION;  Surgeon: Sherren Mocha, MD;  [facility] ENDOSCOPY;  Service:  Cardiovascular;  Laterality: [name];  . CORONARY ANGIOPLASTY WITH STENT PLACEMENT  01/17/13 and in 2008   1 stent placed in 2008  . CORONARY ARTERY BYPASS GRAFT [name] 01/27/2013   Procedure: CORONARY ARTERY BYPASS GRAFTING (CABG) x  two; using left internal mammary artery and right leg greater saphenous vein harvested endoscopically;  Surgeon: Rexene Alberts, MD;  Location: [place];  Service: Open Heart Surgery;  Laterality: [name];  . CYSTOSCOPY    . HERNIA REPAIR  12/18/2009   Incarcerated umbilical hernia  . INTRAOPERATIVE TRANSESOPHAGEAL ECHOCARDIOGRAM [name] 01/27/2013   Procedure: INTRAOPERATIVE TRANSESOPHAGEAL ECHOCARDIOGRAM;  Surgeon: Rexene Alberts, MD;  Location: [place];  Service: Open Heart Surgery;  Laterality: [name];  . MAZE [name] 01/27/2013   Procedure: MAZE;  Surgeon: Rexene Alberts, MD;  Location: [place];  Service: Open Heart Surgery;  Laterality: [name];  . NASAL SEPTUM SURGERY  2004   and sinus repair penta  . SINOSCOPY  2000  . skin taken off of top of mouth and reapplied to gum    . TONSILLECTOMY  1952  . TONSILLECTOMY    . UMBILICAL HERNIA REPAIR  01/2010   Social History   Social History  . Marital status: Married    Spouse name: [name]  . Number of children: [name]  . Years of education: [name]   Social History Main Topics  . Smoking status: Former Smoker  Packs/day: 0.50    Years: 32.00    Types: Pipe, Cigarettes    Quit date: 12/01/1982  . Smokeless tobacco: Never Used     Comment: quit in 1980  . Alcohol use Yes     Comment: beer occasionally  . Drug use: No  . Sexual activity: Yes   Other Topics Concern  . None   Social History Narrative  . None   Allergies  Allergen Reactions  . Asa [Aspirin]     Only can tolerate low dose  . Statins Other (See Comments)    Aches    Family History  Problem Relation Age of Onset  . Heart disease Mother   . Diabetes Father   . Lung disease Father   . Cancer Father        colon     Past medical history, social, surgical and  family history all reviewed in electronic medical record.  No pertanent information unless stated regarding to the chief complaint.   Review of Systems: No headache, visual changes, nausea, vomiting, diarrhea, constipation, dizziness, abdominal pain, skin rash, fevers, chills, night sweats, weight loss, swollen lymph nodes, body aches, joint swelling, muscle aches, chest pain, shortness of breath, mood changes.    Objective  Blood pressure 120/72, pulse (!) 58, weight 229 lb (103.9 kg).   Systems examined below as of 07/27/17 General: NAD A&O x3 mood, affect normal  HEENT: Pupils equal, extraocular movements intact no nystagmus Respiratory: not short of breath at rest or with speaking Cardiovascular: No lower extremity edema, non tender Skin: Warm dry intact with no signs of infection or rash on extremities or on axial skeleton. Abdomen: Soft nontender, no masses Neuro: Cranial nerves  intact, neurovascularly intact in all extremities with 2+ DTRs and 2+ pulses. Lymph: No lymphadenopathy appreciated today  Gait normal with good balance and coordination.  MSK: Non tender with full range of motion and good stability and symmetric strength and tone of  elbows, wrist,  knee hips and ankles bilaterally.   Shoulder: Right Inspection reveals atrophy of the shoulder girdle meniscus consistent with previous exam bruising was seen previously has resolved Mild pain over the distal bicep tendon defect noted Mild loss of range of motion all planes by 2-5 Rotator cuff strength 4 out of 5 compared to contralateral sign Positive impingement with crepitus Speeds and Yergason's tests positive but improved. No labral pathology noted with negative Obrien's, negative clunk and good stability. Normal scapular function observed. Positive painful arc No apprehension sign   MSK US performed of:  This study was ordered, performed, and interpreted by Charlann Boxer D.O.  Shoulder:   Limited ultrasound  patient's right distal bicep shows the patient does have a defect at the muscular tendon juncture. Some of the muscle does seem to be intact but there is retraction greater than 2 cm of most of the tendon.   impression: Bicep tear significant retraction    [id number]; 15 additional minutes spent for Therapeutic exercises as stated in above notes.  This included exercises focusing on stretching, strengthening, with significant focus on eccentric aspects.   Long term goals include an improvement in range of motion, strength, endurance as well as avoiding reinjury. Patient's frequency would include in 1-2 times a day, 3-5 times a week for a duration of 6-12 weeks. Shoulder Exercises that included:  Basic scapular stabilization to include adduction and depression of scapula Scaption, focusing on proper movement and good control Internal and External rotation utilizing a theraband, with elbow  tucked at side entire time Rows with theraband   Proper technique shown and discussed handout in great detail with ATC.  All questions were discussed and answered.   Impression and Recommendations:     This case required medical decision making of moderate complexity.      Note: This dictation was prepared with Dragon dictation along with smaller phrase technology. Any transcriptional errors that result from this process are unintentional.

## 2017-07-27 NOTE — Assessment & Plan Note (Signed)
Patient does have a tear. Discussed with patient at great length. We discussed which activities doing which ones to avoid. Patient is going to start home exercises and work with Product/process development scientist today. We discussed which activities to do. Patient will come back and see me again in 3-4 weeks.

## 2017-08-23 NOTE — Progress Notes (Signed)
[name] [address] [place] [address], [place] [address] Phone: [phone] Subjective:    I'm seeing this patient by the request  of:    CC: right shoulder pain f/u   [id number]  [name] is a 76 y.o. male coming in with complaint of right shoulder pain. Patient was found to have a rotator cuff tear as well as a large biceps tendon tear. Patient was to start formal physical therapy 1 month ago. Continue other conservative therapy. Patient states that 4 weeks ago he started experiencing pain in his back. He gets stuck flexion and gets a shooting pain down his legs. Says his shoulder is fine.  Patient though is having increasing low back pain. Describes the pain as a no, throbbing aching pain that shoots down his right leg. States that was pushing a tractor when it started. Been going on now 3 weeks. Affecting daily activities and waking him up at night. Sometimes feels like he is having some weakness of the lower extremity as well.     Past Medical History:  Diagnosis Date  . Allergy   . Atrial fibrillation ([place])    persistent, failed DCCV;takes Metoprolol daily and taking Pacerone for 7days prior to surgery  . Bronchitis   . Cardiomyopathy, ischemic   . Complication of anesthesia    pt states he gets "crazy" after anesthesia  . Coronary artery disease   . Hay fever    takes Zyrtec daily  . History of bronchitis    last time a year ago  . History of kidney stones   . Hyperlipidemia    takes Zetia daily  . 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 Obesity (BMI 30-39.9)   . Pleural effusion, left 02/21/2013  . 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 S/P Maze operation for atrial fibrillation 01/27/2013   Complete bilateral lesion set using bipolar radiofrequency and cryothermy ablation with clipping of LA appendage  . Sleep apnea   . Type II diabetes mellitus (HCC)    takes Metformin and Januvia daily;pt states he is not a diabetic but pre diabetic   Past Surgical History:  Procedure Laterality Date  . ADENOIDECTOMY    . CARDIOVERSION  11/30/2012   Procedure: CARDIOVERSION;  Surgeon: Sherren Mocha, MD;  Location: [facility] ENDOSCOPY;  Service: Cardiovascular;  Laterality: [name];  . CORONARY ANGIOPLASTY WITH STENT PLACEMENT  01/17/13 and in 2008   1 stent placed in 2008  . CORONARY ARTERY BYPASS GRAFT [name] 01/27/2013   Procedure: CORONARY ARTERY BYPASS GRAFTING (CABG) x  two; using left internal mammary artery and right leg greater saphenous vein harvested endoscopically;  Surgeon: Rexene Alberts, MD;  Location: [place];  Service: Open Heart Surgery;  Laterality: [name];  . CYSTOSCOPY    . HERNIA REPAIR  12/18/2009   Incarcerated umbilical hernia  . INTRAOPERATIVE TRANSESOPHAGEAL ECHOCARDIOGRAM [name] 01/27/2013   Procedure: INTRAOPERATIVE TRANSESOPHAGEAL ECHOCARDIOGRAM;  Surgeon: Rexene Alberts, MD;  Location: [place];  Service: Open Heart Surgery;  Laterality: [name];  . MAZE [name] 01/27/2013   Procedure: MAZE;  Surgeon: Rexene Alberts, MD;  Location: [place];  Service: Open Heart Surgery;  Laterality: [name];  . NASAL SEPTUM SURGERY  2004   and sinus repair penta  . SINOSCOPY  2000  . skin taken off of top of mouth and reapplied to gum    .  TONSILLECTOMY  1952  . TONSILLECTOMY    . UMBILICAL HERNIA REPAIR  01/2010   Social History   Social History  . Marital status: Married    Spouse name: [name]  . Number of children: [name]  . Years of education: [name]   Social History Main Topics  . Smoking status: Former Smoker    Packs/day: 0.50    Years: 32.00    Types: Pipe, Cigarettes    Quit date: 12/01/1982  . Smokeless tobacco: Never Used     Comment: quit in 1980  . Alcohol use Yes     Comment: beer occasionally  . Drug use: No  . Sexual activity: Yes   Other Topics Concern  . None   Social History Narrative  . None   Allergies  Allergen Reactions  . Asa [Aspirin]      Only can tolerate low dose  . Statins Other (See Comments)    Aches    Family History  Problem Relation Age of Onset  . Heart disease Mother   . Diabetes Father   . Lung disease Father   . Cancer Father        colon     Past medical history, social, surgical and family history all reviewed in electronic medical record.  No pertanent information unless stated regarding to the chief complaint.   Review of Systems:Review of systems updated and as accurate as of 08/24/17  No headache, visual changes, nausea, vomiting, diarrhea, constipation, dizziness, abdominal pain, skin rash, fevers, chills, night sweats, weight loss, swollen lymph nodes, body aches, joint swelling, chest pain, shortness of breath, mood changes. Positive muscle aches  Objective  Blood pressure 130/70, pulse 67, height 5\' 11"  (1.803 m), weight 233 lb (105.7 kg), SpO2 95 %. Systems examined below as of 08/24/17   General: No apparent distress alert and oriented x3 mood and affect normal, dressed appropriately.  HEENT: Pupils equal, extraocular movements intact  Respiratory: Patient's speak in full sentences and does not appear short of breath  Cardiovascular: No lower extremity edema, non tender, no erythema  Skin: Warm dry intact with no signs of infection or rash on extremities or on axial skeleton.  Abdomen: Soft nontender  Neuro: Cranial nerves II through XII are intact, neurovascularly intact in all extremities with 2+ DTRs and 2+ pulses.  Lymph: No lymphadenopathy of posterior or anterior cervical chain or axillae bilaterally.  Gait normal with good balance and coordination.  MSK:  Non tender with full range of motion and good stability and symmetric strength and tone of  elbows, wrist, hip, knee and ankles bilaterally. Arthritic changes of multiple joints. Right shoulder exam still shows some atrophy of the musculature surrounding the shoulder. Patient does have positive impingement. Positive O'Brien's. Patient  does have 4-5 strength compared to the contralateral side but seems to be compensating well.  Low back exam shows the patient does have some loss of lordosis. Patient is tender to palpation the paraspinal musculature right greater left. Positive straight leg test. 4+ out of 5 strength of the right lower extremity compared to the contralateral side. Deep tendon reflexes are intact.   Impression and Recommendations:     This case required medical decision making of moderate complexity.      Note: This dictation was prepared with Dragon dictation along with smaller phrase technology. Any transcriptional errors that result from this process are unintentional.

## 2017-08-24 ENCOUNTER — Ambulatory Visit (INDEPENDENT_AMBULATORY_CARE_PROVIDER_SITE_OTHER): Payer: Medicare HMO | Admitting: Family Medicine

## 2017-08-24 ENCOUNTER — Encounter: Payer: Self-pay | Admitting: Family Medicine

## 2017-08-24 DIAGNOSIS — M75121 Complete rotator cuff tear or rupture of right shoulder, not specified as traumatic: Secondary | ICD-10-CM

## 2017-08-24 DIAGNOSIS — M5136 Other intervertebral disc degeneration, lumbar region: Secondary | ICD-10-CM

## 2017-08-24 MED ORDER — PREDNISONE 50 MG PO TABS: 50.0000 mg | ORAL_TABLET | Freq: Every day | ORAL | 0 refills | Status: DC

## 2017-08-24 MED ORDER — GABAPENTIN 100 MG PO CAPS: 200.0000 mg | ORAL_CAPSULE | Freq: Every day | ORAL | 3 refills | Status: DC

## 2017-08-24 NOTE — Assessment & Plan Note (Signed)
Patient is having an exacerbation. Seems to be having more radicular symptom. Prednisone as well as gabapentin given. Discussed possible side effects. Patient will take this on a more regular basis. Discussed seen him again in 2 weeks for further workup may be necessary.

## 2017-08-24 NOTE — Assessment & Plan Note (Signed)
Patient will likely have rotator cuff arthropathy at some point. Patient was to continue with conservative therapy. We discussed icing regimen and home exercises. We discussed which activities to do in which ones to avoid. Patient states follow-up again in 2 weeks. We'll do injections in necessary.

## 2017-08-24 NOTE — Patient Instructions (Signed)
You are a good man.  Thank you so much for the apples Stay active New stretches for after your exercises Gabapentin 200mg  at night will help with the nerve pain  Prednisone daily for 5 days but please watch your blood sugars.  This will help the pain very quickly though.  See me again in 2 weeks and we will make sure you are doing better
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
## 2017-09-21 ENCOUNTER — Ambulatory Visit (INDEPENDENT_AMBULATORY_CARE_PROVIDER_SITE_OTHER): Payer: Medicare HMO | Admitting: Family Medicine

## 2017-09-21 ENCOUNTER — Encounter: Payer: Self-pay | Admitting: Family Medicine

## 2017-09-21 DIAGNOSIS — M5136 Other intervertebral disc degeneration, lumbar region: Secondary | ICD-10-CM

## 2017-09-21 DIAGNOSIS — M75121 Complete rotator cuff tear or rupture of right shoulder, not specified as traumatic: Secondary | ICD-10-CM | POA: Diagnosis not present

## 2017-09-21 DIAGNOSIS — S46219A Strain of muscle, fascia and tendon of other parts of biceps, unspecified arm, initial encounter: Secondary | ICD-10-CM

## 2017-09-21 MED ORDER — GABAPENTIN 100 MG PO CAPS: 200.0000 mg | ORAL_CAPSULE | Freq: Three times a day (TID) | ORAL | 3 refills | Status: DC

## 2017-09-21 NOTE — Patient Instructions (Addendum)
Good to see you  Alvera Singh is your friend.  Gabapentin 200mg  3 times a day  See me again in 2 months or call me at [phone] if you need me sooner
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
## 2017-09-21 NOTE — Assessment & Plan Note (Signed)
Stable. No change. We discussed compression. Worsening symptoms consider further imaging but I do not think it is likely.

## 2017-09-21 NOTE — Assessment & Plan Note (Signed)
Stable. Still has painful arc. Patient declined any type of injection. We'll continue with conservative therapy.

## 2017-10-19 DIAGNOSIS — E1165 Type 2 diabetes mellitus with hyperglycemia: Secondary | ICD-10-CM | POA: Diagnosis not present

## 2017-10-19 DIAGNOSIS — Z23 Encounter for immunization: Secondary | ICD-10-CM | POA: Diagnosis not present

## 2017-10-19 DIAGNOSIS — D485 Neoplasm of uncertain behavior of skin: Secondary | ICD-10-CM | POA: Diagnosis not present

## 2017-11-10 ENCOUNTER — Observation Stay (HOSPITAL_COMMUNITY)
Admission: EM | Admit: 2017-11-10 | Discharge: 2017-11-12 | Disposition: A | Discharge status: Home / Self Care | Payer: Medicare HMO | Attending: Internal Medicine | Admitting: Internal Medicine

## 2017-11-10 ENCOUNTER — Other Ambulatory Visit: Payer: Self-pay

## 2017-11-10 ENCOUNTER — Emergency Department (HOSPITAL_COMMUNITY): Payer: Medicare HMO

## 2017-11-10 ENCOUNTER — Emergency Department (INDEPENDENT_AMBULATORY_CARE_PROVIDER_SITE_OTHER)
Admission: EM | Admit: 2017-11-10 | Discharge: 2017-11-10 | Disposition: A | Discharge status: Home / Self Care | Payer: Medicare HMO | Source: Home / Self Care | Attending: Family Medicine | Admitting: Family Medicine

## 2017-11-10 ENCOUNTER — Encounter (HOSPITAL_COMMUNITY): Payer: Self-pay | Admitting: Emergency Medicine

## 2017-11-10 ENCOUNTER — Telehealth (INDEPENDENT_AMBULATORY_CARE_PROVIDER_SITE_OTHER): Payer: Medicare HMO | Admitting: Emergency Medicine

## 2017-11-10 DIAGNOSIS — R6510 Systemic inflammatory response syndrome (SIRS) of non-infectious origin without acute organ dysfunction: Secondary | ICD-10-CM

## 2017-11-10 DIAGNOSIS — R479 Unspecified speech disturbances: Secondary | ICD-10-CM | POA: Diagnosis not present

## 2017-11-10 DIAGNOSIS — Z7984 Long term (current) use of oral hypoglycemic drugs: Secondary | ICD-10-CM | POA: Insufficient documentation

## 2017-11-10 DIAGNOSIS — K219 Gastro-esophageal reflux disease without esophagitis: Secondary | ICD-10-CM | POA: Diagnosis present

## 2017-11-10 DIAGNOSIS — N182 Chronic kidney disease, stage 2 (mild): Secondary | ICD-10-CM | POA: Diagnosis present

## 2017-11-10 DIAGNOSIS — Z79899 Other long term (current) drug therapy: Secondary | ICD-10-CM | POA: Diagnosis not present

## 2017-11-10 DIAGNOSIS — R531 Weakness: Secondary | ICD-10-CM

## 2017-11-10 DIAGNOSIS — Z955 Presence of coronary angioplasty implant and graft: Secondary | ICD-10-CM | POA: Insufficient documentation

## 2017-11-10 DIAGNOSIS — I2510 Atherosclerotic heart disease of native coronary artery without angina pectoris: Secondary | ICD-10-CM | POA: Diagnosis present

## 2017-11-10 DIAGNOSIS — I10 Essential (primary) hypertension: Secondary | ICD-10-CM | POA: Diagnosis present

## 2017-11-10 DIAGNOSIS — R42 Dizziness and giddiness: Secondary | ICD-10-CM | POA: Diagnosis not present

## 2017-11-10 DIAGNOSIS — R112 Nausea with vomiting, unspecified: Secondary | ICD-10-CM | POA: Diagnosis not present

## 2017-11-10 DIAGNOSIS — Z951 Presence of aortocoronary bypass graft: Secondary | ICD-10-CM | POA: Diagnosis not present

## 2017-11-10 DIAGNOSIS — E1122 Type 2 diabetes mellitus with diabetic chronic kidney disease: Secondary | ICD-10-CM | POA: Insufficient documentation

## 2017-11-10 DIAGNOSIS — T68XXXA Hypothermia, initial encounter: Secondary | ICD-10-CM

## 2017-11-10 DIAGNOSIS — Z7982 Long term (current) use of aspirin: Secondary | ICD-10-CM | POA: Diagnosis not present

## 2017-11-10 DIAGNOSIS — I252 Old myocardial infarction: Secondary | ICD-10-CM | POA: Insufficient documentation

## 2017-11-10 DIAGNOSIS — R809 Proteinuria, unspecified: Secondary | ICD-10-CM

## 2017-11-10 DIAGNOSIS — H5500 Unspecified nystagmus: Secondary | ICD-10-CM | POA: Diagnosis not present

## 2017-11-10 DIAGNOSIS — R55 Syncope and collapse: Secondary | ICD-10-CM

## 2017-11-10 DIAGNOSIS — R404 Transient alteration of awareness: Secondary | ICD-10-CM | POA: Diagnosis not present

## 2017-11-10 DIAGNOSIS — E1129 Type 2 diabetes mellitus with other diabetic kidney complication: Secondary | ICD-10-CM

## 2017-11-10 DIAGNOSIS — I129 Hypertensive chronic kidney disease with stage 1 through stage 4 chronic kidney disease, or unspecified chronic kidney disease: Secondary | ICD-10-CM | POA: Insufficient documentation

## 2017-11-10 DIAGNOSIS — Z87891 Personal history of nicotine dependence: Secondary | ICD-10-CM | POA: Diagnosis not present

## 2017-11-10 DIAGNOSIS — I4891 Unspecified atrial fibrillation: Secondary | ICD-10-CM | POA: Diagnosis present

## 2017-11-10 DIAGNOSIS — R61 Generalized hyperhidrosis: Secondary | ICD-10-CM | POA: Insufficient documentation

## 2017-11-10 DIAGNOSIS — R110 Nausea: Secondary | ICD-10-CM | POA: Diagnosis not present

## 2017-11-10 DIAGNOSIS — Z8679 Personal history of other diseases of the circulatory system: Secondary | ICD-10-CM

## 2017-11-10 DIAGNOSIS — R471 Dysarthria and anarthria: Secondary | ICD-10-CM | POA: Diagnosis not present

## 2017-11-10 DIAGNOSIS — Z9889 Other specified postprocedural states: Secondary | ICD-10-CM

## 2017-11-10 DIAGNOSIS — E785 Hyperlipidemia, unspecified: Secondary | ICD-10-CM | POA: Diagnosis not present

## 2017-11-10 HISTORY — DX: Type 2 diabetes mellitus with other diabetic kidney complication: E11.29

## 2017-11-10 HISTORY — DX: Low back pain, unspecified: M54.50

## 2017-11-10 HISTORY — DX: Personal history of other diseases of the digestive system: Z87.19

## 2017-11-10 HISTORY — DX: Gastro-esophageal reflux disease without esophagitis: K21.9

## 2017-11-10 HISTORY — DX: Chronic kidney disease, stage 2 (mild): N18.2

## 2017-11-10 HISTORY — DX: Other seasonal allergic rhinitis: J30.2

## 2017-11-10 HISTORY — DX: Other chronic pain: G89.29

## 2017-11-10 HISTORY — DX: Unspecified chronic bronchitis: J42

## 2017-11-10 HISTORY — DX: Unspecified osteoarthritis, unspecified site: M19.90

## 2017-11-10 HISTORY — DX: Low back pain: M54.5

## 2017-11-10 LAB — I-STAT CG4 LACTIC ACID, ED
Lactic Acid, Venous: 2.64 mmol/L (ref 0.5–1.9)
Lactic Acid, Venous: 2.08 mmol/L (ref 0.5–1.9)

## 2017-11-10 LAB — GLUCOSE, CAPILLARY: Glucose-Capillary: 141 mg/dL — ABNORMAL HIGH (ref 65–99)

## 2017-11-10 LAB — BASIC METABOLIC PANEL
Anion gap: 13 (ref 5–15)
BUN: 23 mg/dL — ABNORMAL HIGH (ref 6–20)
CO2: 22 mmol/L (ref 22–32)
Calcium: 9 mg/dL (ref 8.9–10.3)
Chloride: 99 mmol/L — ABNORMAL LOW (ref 101–111)
Creatinine, Ser: 1.33 mg/dL — ABNORMAL HIGH (ref 0.61–1.24)
GFR calc Af Amer: 59 mL/min — ABNORMAL LOW (ref >60)
GFR calc non Af Amer: 51 mL/min — ABNORMAL LOW (ref >60)
Glucose, Bld: 180 mg/dL — ABNORMAL HIGH (ref 65–99)
Potassium: 4 mmol/L (ref 3.5–5.1)
Sodium: 134 mmol/L — ABNORMAL LOW (ref 135–145)

## 2017-11-10 LAB — POCT FASTING CBG KUC MANUAL ENTRY: POCT Glucose (KUC): 179 mg/dL — AB (ref 70–99)

## 2017-11-10 LAB — CBC
HCT: 43.9 % (ref 39.0–52.0)
Hemoglobin: 14.5 g/dL (ref 13.0–17.0)
MCH: 30.7 pg (ref 26.0–34.0)
MCHC: 33 g/dL (ref 30.0–36.0)
MCV: 93 fL (ref 78.0–100.0)
Platelets: 178 10*3/uL (ref 150–400)
RBC: 4.72 MIL/uL (ref 4.22–5.81)
RDW: 13.6 % (ref 11.5–15.5)
WBC: 10.3 10*3/uL (ref 4.0–10.5)

## 2017-11-10 LAB — PROTIME-INR
INR: 1.07
Prothrombin Time: 13.8 seconds (ref 11.4–15.2)

## 2017-11-10 LAB — CBG MONITORING, ED: Glucose-Capillary: 158 mg/dL — ABNORMAL HIGH (ref 65–99)

## 2017-11-10 LAB — I-STAT TROPONIN, ED: Troponin i, poc: 0 ng/mL (ref 0.00–0.08)

## 2017-11-10 MED ORDER — ADULT MULTIVITAMIN W/MINERALS CH
1.0000 | ORAL_TABLET | Freq: Every day | ORAL | Status: DC
  Administered 2017-11-11 – 2017-11-12 (×2): 1 via ORAL
  Filled 2017-11-10 (×2): qty 1

## 2017-11-10 MED ORDER — VANCOMYCIN HCL IN DEXTROSE 1-5 GM/200ML-% IV SOLN: 1000.0000 mg | Freq: Once | INTRAVENOUS | Status: DC

## 2017-11-10 MED ORDER — ONDANSETRON HCL 4 MG/2ML IJ SOLN: 4.0000 mg | Freq: Three times a day (TID) | INTRAMUSCULAR | Status: DC | PRN

## 2017-11-10 MED ORDER — VITAMIN D 1000 UNITS PO TABS
1000.0000 [IU] | ORAL_TABLET | Freq: Every day | ORAL | Status: DC
  Administered 2017-11-11 – 2017-11-12 (×2): 1000 [IU] via ORAL
  Filled 2017-11-10 (×4): qty 1

## 2017-11-10 MED ORDER — ENOXAPARIN SODIUM 40 MG/0.4ML ~~LOC~~ SOLN
40.0000 mg | SUBCUTANEOUS | Status: DC
  Filled 2017-11-10: qty 0.4

## 2017-11-10 MED ORDER — METOPROLOL SUCCINATE ER 25 MG PO TB24
25.0000 mg | ORAL_TABLET | Freq: Every day | ORAL | Status: DC
  Filled 2017-11-10: qty 1

## 2017-11-10 MED ORDER — GABAPENTIN 100 MG PO CAPS
200.0000 mg | ORAL_CAPSULE | Freq: Three times a day (TID) | ORAL | Status: DC
  Administered 2017-11-11 (×2): 200 mg via ORAL
  Filled 2017-11-10 (×3): qty 2

## 2017-11-10 MED ORDER — SODIUM CHLORIDE 0.9 % IV BOLUS (SEPSIS)
1000.0000 mL | Freq: Once | INTRAVENOUS | Status: AC
  Administered 2017-11-10: 1000 mL via INTRAVENOUS

## 2017-11-10 MED ORDER — SENNOSIDES-DOCUSATE SODIUM 8.6-50 MG PO TABS: 1.0000 | ORAL_TABLET | Freq: Every evening | ORAL | Status: DC | PRN

## 2017-11-10 MED ORDER — ZOLPIDEM TARTRATE 5 MG PO TABS: 5.0000 mg | ORAL_TABLET | Freq: Every evening | ORAL | Status: DC | PRN

## 2017-11-10 MED ORDER — MECLIZINE HCL 12.5 MG PO TABS
25.0000 mg | ORAL_TABLET | Freq: Three times a day (TID) | ORAL | Status: DC | PRN
Start: 2017-11-10 — End: 2017-11-12

## 2017-11-10 MED ORDER — SODIUM CHLORIDE 0.9 % IV SOLN
INTRAVENOUS | Status: DC
  Administered 2017-11-10: via INTRAVENOUS
  Administered 2017-11-11: 125 mL/h via INTRAVENOUS

## 2017-11-10 MED ORDER — VANCOMYCIN HCL 10 G IV SOLR
1250.0000 mg | INTRAVENOUS | Status: DC
  Filled 2017-11-10: qty 1250

## 2017-11-10 MED ORDER — HYDROCORTISONE NA SUCCINATE PF 100 MG IJ SOLR
50.0000 mg | Freq: Once | INTRAMUSCULAR | Status: AC
  Administered 2017-11-10: 50 mg via INTRAVENOUS
  Filled 2017-11-10: qty 2

## 2017-11-10 MED ORDER — PIPERACILLIN-TAZOBACTAM 3.375 G IVPB 30 MIN
3.3750 g | Freq: Once | INTRAVENOUS | Status: AC
  Administered 2017-11-10: 3.375 g via INTRAVENOUS
  Filled 2017-11-10: qty 50

## 2017-11-10 MED ORDER — VITAMIN B-12 1000 MCG PO TABS
500.0000 ug | ORAL_TABLET | Freq: Every day | ORAL | Status: DC
  Administered 2017-11-11 – 2017-11-12 (×2): 500 ug via ORAL
  Filled 2017-11-10 (×2): qty 1

## 2017-11-10 MED ORDER — GLUCOSAMINE 1500 COMPLEX PO CAPS: 1.0000 | ORAL_CAPSULE | Freq: Every day | ORAL | Status: DC

## 2017-11-10 MED ORDER — MECLIZINE HCL 25 MG PO TABS
25.0000 mg | ORAL_TABLET | Freq: Once | ORAL | Status: AC
  Administered 2017-11-10: 25 mg via ORAL
  Filled 2017-11-10: qty 1

## 2017-11-10 MED ORDER — ACETAMINOPHEN 325 MG PO TABS: 650.0000 mg | ORAL_TABLET | Freq: Four times a day (QID) | ORAL | Status: DC | PRN

## 2017-11-10 MED ORDER — INSULIN ASPART 100 UNIT/ML ~~LOC~~ SOLN: 0.0000 [IU] | Freq: Three times a day (TID) | SUBCUTANEOUS | Status: DC

## 2017-11-10 MED ORDER — TRIAMCINOLONE ACETONIDE 0.1 % EX CREA
1.0000 "application " | TOPICAL_CREAM | Freq: Two times a day (BID) | CUTANEOUS | Status: DC | PRN
  Filled 2017-11-10: qty 15

## 2017-11-10 MED ORDER — PANTOPRAZOLE SODIUM 40 MG PO TBEC
40.0000 mg | DELAYED_RELEASE_TABLET | Freq: Every day | ORAL | Status: DC
  Administered 2017-11-11 – 2017-11-12 (×2): 40 mg via ORAL
  Filled 2017-11-10 (×2): qty 1

## 2017-11-10 MED ORDER — VANCOMYCIN HCL 10 G IV SOLR: 1250.0000 mg | INTRAVENOUS | Status: DC

## 2017-11-10 MED ORDER — OLOPATADINE HCL 0.1 % OP SOLN
1.0000 [drp] | Freq: Two times a day (BID) | OPHTHALMIC | Status: DC | PRN
  Filled 2017-11-10: qty 5

## 2017-11-10 MED ORDER — ASPIRIN EC 81 MG PO TBEC
81.0000 mg | DELAYED_RELEASE_TABLET | Freq: Every day | ORAL | Status: DC
  Administered 2017-11-11 – 2017-11-12 (×2): 81 mg via ORAL
  Filled 2017-11-10 (×3): qty 1

## 2017-11-10 MED ORDER — MONTELUKAST SODIUM 10 MG PO TABS
10.0000 mg | ORAL_TABLET | Freq: Every day | ORAL | Status: DC
  Administered 2017-11-11 (×2): 10 mg via ORAL
  Filled 2017-11-10 (×2): qty 1

## 2017-11-10 MED ORDER — PIPERACILLIN-TAZOBACTAM 3.375 G IVPB
3.3750 g | Freq: Three times a day (TID) | INTRAVENOUS | Status: DC
  Administered 2017-11-11 (×2): 3.375 g via INTRAVENOUS
  Filled 2017-11-10 (×3): qty 50

## 2017-11-10 MED ORDER — VANCOMYCIN HCL 10 G IV SOLR
2000.0000 mg | Freq: Once | INTRAVENOUS | Status: AC
  Administered 2017-11-10: 2000 mg via INTRAVENOUS
  Filled 2017-11-10: qty 2000

## 2017-11-10 MED ORDER — VITAMIN C 500 MG PO TABS
1000.0000 mg | ORAL_TABLET | Freq: Every day | ORAL | Status: DC
  Administered 2017-11-11 – 2017-11-12 (×2): 1000 mg via ORAL
  Filled 2017-11-10 (×2): qty 2

## 2017-11-10 MED ORDER — STROKE: EARLY STAGES OF RECOVERY BOOK
Freq: Once | Status: AC
  Administered 2017-11-10
  Filled 2017-11-10: qty 1

## 2017-11-10 MED ORDER — HYDRALAZINE HCL 20 MG/ML IJ SOLN: 5.0000 mg | INTRAMUSCULAR | Status: DC | PRN

## 2017-11-10 MED ORDER — ALBUTEROL SULFATE (2.5 MG/3ML) 0.083% IN NEBU: 2.5000 mg | INHALATION_SOLUTION | RESPIRATORY_TRACT | Status: DC | PRN

## 2017-11-10 MED ORDER — LORATADINE 10 MG PO TABS
10.0000 mg | ORAL_TABLET | Freq: Every day | ORAL | Status: DC
  Administered 2017-11-11: 10 mg via ORAL
  Filled 2017-11-10 (×2): qty 1

## 2017-11-10 NOTE — ED Triage Notes (Signed)
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  Today for the last 3 hours has felt faint, generalized weakness.  Very slow to answer questions.

## 2017-11-10 NOTE — H&P (Signed)
History and Physical    [name] TDV:[id number] DOB: [birth year] DOA: 11/10/2017  Referring MD/NP/PA:   PCP: Lorene Dy, MD   Patient coming from:  The patient is coming from home.  At baseline, pt is independent for most of ADL.   Chief Complaint: Difficulty speaking, dizziness, poor balance, generalized weakness, nausea, vomiting and diaphoresis.  HPI: [name] is a 76 y.o. male with medical history significant of hypertension, hyperlipidemia, diabetes mellitus, GERD, OSA no on CPAP, obesity, atrial fibrillation not on AC, s/p of maze, CAD, s/p of stent placement, s/p of CABG, CKD-2, who presents with difficulty speaking, dizziness, poor balance,  generalized weakness, nausea, vomiting and diaphoresis.  Per pt's son. pt was in his usual health state until about 8 AM this morning when he started feeling nauseous and vomited once. He had mild abdominal pain, which has resolved. No diarrhea. No symptoms of UTI. No fever or chills. Pt has generalized weakness, but no unilateral weakness, numbness or tingling in extremities.  No vision change or hearing loss. He has dizziness and poor balance. No feeling of room spinning. No ear ringing. He was found to have difficulty speaking by his son. Pt denies chest pain, shortness of breath, cough. Pt was seen in [place] urgent care, and was sent to ED for evaluation.  ED Course: pt was found to have hypothermia with temperature and 95.4,WBC 10.3, lactic acid 2.64--> 2.08, negative troponin, pending urinalysis, slightly worsening renal function, bradycardia, no tachypnea, oxygen saturation 85-95% on room air, negative chest x-ray, negative CT head for acute intracranial abnormalities. Patient is placed on telemetry bed for observation. Neurology Dr. Rory Percy was consulted.  Review of Systems:   General: no fevers, chills, no body weight gain, has poor appetite, has fatigue HEENT: no blurry vision, hearing changes or sore  throat Respiratory: no dyspnea, coughing, wheezing CV: no chest pain, no palpitations GI: has nausea, vomiting, abdominal pain, no diarrhea, constipation GU: no dysuria, burning on urination, increased urinary frequency, hematuria  Ext: no leg edema Neuro: no unilateral weakness, numbness, or tingling, no vision change or hearing loss. Has dizziness, poor balance and difficult speaking. Skin: no rash, no skin tear. MSK: No muscle spasm, no deformity, no limitation of range of movement in spin Heme: No easy bruising.  Travel history: No recent long distant travel.  Allergy:  Allergies  Allergen Reactions  . Asa [Aspirin] Other (See Comments)    "Only can tolerate low dose" (specific reaction not recalled)  . Statins Other (See Comments)    Statins cause muscle aches    Past Medical History:  Diagnosis Date  . Allergy   . Atrial fibrillation ([place])    persistent, failed DCCV;takes Metoprolol daily and taking Pacerone for 7days prior to surgery  . Bronchitis   . Cardiomyopathy, ischemic   . Complication of anesthesia    pt states he gets "crazy" after anesthesia  . Coronary artery disease   . Hay fever    takes Zyrtec daily  . History of bronchitis    last time a year ago  . History of kidney stones   . Hyperlipidemia    takes Zetia daily  . Myocardial infarction [facility]) 2008   inferior wall, treated with BMS in RCA  . Obesity (BMI 30-39.9)   . Pleural effusion, left 02/21/2013  . 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 S/P Maze operation for atrial fibrillation 01/27/2013   Complete  bilateral lesion set using bipolar radiofrequency and cryothermy ablation with clipping of LA appendage  . Sleep apnea   . Type II diabetes mellitus (HCC)    takes Metformin and Januvia daily;pt states he is not a diabetic but pre diabetic    Past Surgical History:  Procedure Laterality Date  . ADENOIDECTOMY    . CARDIOVERSION  11/30/2012   Procedure:  CARDIOVERSION;  Surgeon: Sherren Mocha, MD;  Location: [facility] ENDOSCOPY;  Service: Cardiovascular;  Laterality: [name];  . CORONARY ANGIOPLASTY WITH STENT PLACEMENT  01/17/13 and in 2008   1 stent placed in 2008  . CORONARY ARTERY BYPASS GRAFT [name] 01/27/2013   Procedure: CORONARY ARTERY BYPASS GRAFTING (CABG) x  two; using left internal mammary artery and right leg greater saphenous vein harvested endoscopically;  Surgeon: Rexene Alberts, MD;  Location: [place];  Service: Open Heart Surgery;  Laterality: [name];  . CYSTOSCOPY    . HERNIA REPAIR  12/18/2009   Incarcerated umbilical hernia  . INTRAOPERATIVE TRANSESOPHAGEAL ECHOCARDIOGRAM [name] 01/27/2013   Procedure: INTRAOPERATIVE TRANSESOPHAGEAL ECHOCARDIOGRAM;  Surgeon: Rexene Alberts, MD;  Location: [place];  Service: Open Heart Surgery;  Laterality: [name];  . MAZE [name] 01/27/2013   Procedure: MAZE;  Surgeon: Rexene Alberts, MD;  Location: [place];  Service: Open Heart Surgery;  Laterality: [name];  . NASAL SEPTUM SURGERY  2004   and sinus repair penta  . SINOSCOPY  2000  . skin taken off of top of mouth and reapplied to gum    . TONSILLECTOMY  1952  . TONSILLECTOMY    . UMBILICAL HERNIA REPAIR  01/2010    Social History:  reports that he quit smoking about 34 years ago. His smoking use included pipe and cigarettes. He has a 16.00 pack-year smoking history. he has never used smokeless tobacco. He reports that he drinks alcohol. He reports that he does not use drugs.  Family History:  Family History  Problem Relation Age of Onset  . Heart disease Mother   . Diabetes Father   . Lung disease Father   . Cancer Father        colon     Prior to Admission medications   Medication Sig Start Date End Date Taking? Authorizing Provider  Ascorbic Acid (VITAMIN C) 1000 MG tablet Take 1,000 mg by mouth daily.   Yes [provider]  aspirin EC 81 MG EC tablet Take 1 tablet (81 mg total) by mouth daily. 02/01/13  Yes Barrett, Erin R, PA-C    cetirizine-pseudoephedrine (ZYRTEC-D) 5-120 MG tablet Take 1 tablet by mouth 2 (two) times daily.   Yes [provider]  Cholecalciferol (VITAMIN D-3) 1000 units CAPS Take 1,000 Units by mouth daily.    Yes [provider]  CINNAMON PO Take 2 capsules by mouth daily.    Yes [provider]  Cyanocobalamin (B-12) 500 MCG TABS Take 500 mcg by mouth daily.   Yes [provider]  gabapentin (NEURONTIN) 100 MG capsule Take 2 capsules (200 mg total) by mouth 3 (three) times daily. Patient taking differently: Take 200 mg by mouth at bedtime.  09/21/17  Yes Lyndal Pulley, DO  Glucosamine-Chondroit-Vit C-Mn (GLUCOSAMINE 1500 COMPLEX PO) Take 1 tablet by mouth daily.   Yes [provider]  losartan (COZAAR) 25 MG tablet Take 1 tablet (25 mg total) by mouth daily. 04/26/13  Yes Sherren Mocha, MD  metFORMIN (GLUCOPHAGE) 500 MG tablet Take 500 mg by mouth 2 (two) times daily with a meal.  Yes [provider]  metoprolol succinate (TOPROL-XL) 25 MG 24 hr tablet Take 25 mg by mouth daily.   Yes [provider]  montelukast (SINGULAIR) 10 MG tablet Take 1 tablet (10 mg total) by mouth at bedtime. 04/16/17  Yes Kozlow, Donnamarie Poag, MD  Multiple Vitamins-Minerals (MULTIVITAMIN WITH MINERALS) tablet Take 1 tablet by mouth daily.   Yes [provider]  olopatadine (PATANOL) 0.1 % ophthalmic solution Place 1 drop into both eyes 2 (two) times daily. Patient taking differently: Place 1 drop into both eyes 4 (four) times daily as needed for allergies.  04/16/17  Yes Kozlow, Donnamarie Poag, MD  pantoprazole (PROTONIX) 40 MG tablet Take 1 tablet (40 mg total) by mouth daily. Take 30-60 min before first meal of the day 04/16/17  Yes Kozlow, Donnamarie Poag, MD  pioglitazone (ACTOS) 45 MG tablet Take 45 mg by mouth daily. 10/19/17  Yes [provider]  triamcinolone cream (KENALOG) 0.1 % Apply 1 application topically 2 (two) times daily as needed (for irritation).   03/10/16  Yes [provider]  Azelastine-Fluticasone (DYMISTA) 137-50 MCG/ACT SUSP Place 1 spray into the nose 2 (two) times daily. Patient not taking: Reported on 11/10/2017 12/23/16   Tanda Rockers, MD  famotidine (PEPCID) 20 MG tablet One at bedtime Patient not taking: Reported on 11/10/2017 11/13/16   Tanda Rockers, MD  glucose blood (CHOICE DM FORA G20 TEST STRIPS) test strip Use as instructed 02/04/13   Barrett, Erin R, PA-C  glucose monitoring kit (FREESTYLE) monitoring kit 1 each by Does not apply route as needed for other. 02/04/13   Barrett, Erin R, PA-C  Lancets (ONETOUCH ULTRASOFT) lancets Use as instructed 02/04/13   Marcene Corning    Physical Exam: Vitals:   11/10/17 1815 11/10/17 1945 11/10/17 1950 11/10/17 2000  BP: 112/70 (!) 104/54  (!) 105/56  Pulse: 60   61  Resp: 14   10  Temp:   97.6 F (36.4 C)   TempSrc:   Oral   SpO2: (!) 85%   95%   General: Not in acute distress HEENT:       Eyes: PERRL, EOMI, no scleral icterus.       ENT: No discharge from the ears and nose, no pharynx injection, no tonsillar enlargement.        Neck: No JVD, no bruit, no mass felt. Heme: No neck lymph node enlargement. Cardiac: S1/S2, RRR, No murmurs, No gallops or rubs. Respiratory: No rales, wheezing, rhonchi or rubs. GI: Soft, nondistended, nontender, no rebound pain, no organomegaly, BS present. GU: No hematuria Ext: No pitting leg edema bilaterally. 2+DP/PT pulse bilaterally. Musculoskeletal: No joint deformities, No joint redness or warmth, no limitation of ROM in spin. Skin: No rashes.  Neuro: Alert, oriented X3, cranial nerves II-XII grossly intact, moves all extremities normally. Muscle strength 5/5 in all extremities, sensation to light touch intact. Brachial reflex 2+ bilaterally. Negative Babinski's sign.  Psych: Patient is not psychotic, no suicidal or hemocidal ideation.  Labs on Admission: I have personally reviewed following labs and imaging  studies  CBC: Recent Labs  Lab 11/10/17 1357  WBC 10.3  HGB 14.5  HCT 43.9  MCV 93.0  PLT 536   Basic Metabolic Panel: Recent Labs  Lab 11/10/17 1327  NA 134*  K 4.0  CL 99*  CO2 22  GLUCOSE 180*  BUN 23*  CREATININE 1.33*  CALCIUM 9.0   GFR: CrCl cannot be calculated (Unknown ideal weight.). Liver Function Tests:  No results for input(s): AST, ALT, ALKPHOS, BILITOT, PROT, ALBUMIN in the last 168 hours. No results for input(s): LIPASE, AMYLASE in the last 168 hours. No results for input(s): AMMONIA in the last 168 hours. Coagulation Profile: No results for input(s): INR, PROTIME in the last 168 hours. Cardiac Enzymes: No results for input(s): CKTOTAL, CKMB, CKMBINDEX, TROPONINI in the last 168 hours. BNP (last 3 results) No results for input(s): PROBNP in the last 8760 hours. HbA1C: No results for input(s): HGBA1C in the last 72 hours. CBG: Recent Labs  Lab 11/10/17 1805  GLUCAP 158*   Lipid Profile: No results for input(s): CHOL, HDL, LDLCALC, TRIG, CHOLHDL, LDLDIRECT in the last 72 hours. Thyroid Function Tests: No results for input(s): TSH, T4TOTAL, FREET4, T3FREE, THYROIDAB in the last 72 hours. Anemia Panel: No results for input(s): VITAMINB12, FOLATE, FERRITIN, TIBC, IRON, RETICCTPCT in the last 72 hours. Urine analysis:    Component Value Date/Time   COLORURINE YELLOW 03/04/2015 0751   APPEARANCEUR CLEAR 03/04/2015 0751   LABSPEC 1.016 03/04/2015 0751   PHURINE 6.0 03/04/2015 0751   GLUCOSEU NEGATIVE 03/04/2015 0751   HGBUR NEGATIVE 03/04/2015 0751   BILIRUBINUR NEGATIVE 03/04/2015 0751   KETONESUR NEGATIVE 03/04/2015 0751   PROTEINUR NEGATIVE 03/04/2015 0751   UROBILINOGEN 0.2 03/04/2015 0751   NITRITE NEGATIVE 03/04/2015 0751   LEUKOCYTESUR NEGATIVE 03/04/2015 0751   Sepsis Labs: _0 (procalcitonin:4,lacticidven:4) )No results found for this or any previous visit (from the past 240 hour(s)).   Radiological Exams on Admission: Ct  Head Wo Contrast  Result Date: 11/10/2017 CLINICAL DATA:  Generalized weakness. EXAM: CT HEAD WITHOUT CONTRAST TECHNIQUE: Contiguous axial images were obtained from the base of the skull through the vertex without intravenous contrast. COMPARISON:  CT scan of August 21, 2013. FINDINGS: Brain: Mild diffuse cortical atrophy is noted. Old left cerebellar infarction is noted. Mild chronic ischemic white matter disease is noted. No mass effect or midline shift is noted. Ventricular size is within normal limits. There is no evidence of mass lesion, hemorrhage or acute infarction. Vascular: No hyperdense vessel or unexpected calcification. Skull: Normal. Negative for fracture or focal lesion. Sinuses/Orbits: Mucous retention cysts are noted in both maxillary sinuses. Other: None. IMPRESSION: Mild diffuse cortical atrophy. Mild chronic ischemic white matter disease. No acute intracranial abnormality seen. Electronically Signed   By: Marijo Conception, M.D.   On: 11/10/2017 19:56   Dg Chest Portable 1 View  Result Date: 11/10/2017 CLINICAL DATA:  76 year old male was pushing signal on a tractor all day yesterday and has now felt faint with generalized weakness for the past 3 hours. EXAM: PORTABLE CHEST 1 VIEW COMPARISON:  11/13/2016 FINDINGS: Top-normal size heart with aortic atherosclerosis. Status post CABG with left atrial appendage clip. Clear lungs with minimal atelectasis at the lung bases. No pneumonic consolidation or CHF. No significant effusions. No acute osseous appearing abnormality. The lateral costophrenic angles are excluded bilaterally. IMPRESSION: Borderline cardiomegaly with post CABG change. No active pulmonary disease. Electronically Signed   By: Ashley Royalty M.D.   On: 11/10/2017 18:21     EKG: Independently reviewed.  Sinus rhythm, QTC 480, Q wave in inferior leads which is old, poor R-wave progression,    Assessment/Plan Principal Problem:   Difficulty speaking Active Problems:    Atrial fibrillation (HCC)   Coronary atherosclerosis of native coronary artery   Type II diabetes mellitus with renal manifestations (HCC)   Hyperlipidemia   Hypertension   S/P CABG x 2   S/P Maze operation for atrial  fibrillation   Dizziness   Chronic kidney disease (CKD), stage II (mild)   GERD (gastroesophageal reflux disease)   Hypothermia   Difficulty speaking, dizziness and poor balance: Etiology is not clear. CT of head is negative for acute intracranial abnormalities. Neurology was consulted. Dr. Rory Percy recommended MRI/MRA and MRA of neck to r/o posterior circulation stroke.  - will place on tele bed for obs - Appreciate Dr. Johny Chess consultation, will follow up his Recs.  - Obtain MRI/MRA of brain and MRA of neck - Continue ASA  - fasting lipid panel and HbA1c  - PT/OT consult  Hypothermia: Etiology is not clear. Temperature improved from 95.4-97.6 with warm blanket. Patient also has elevated lactic acid 2.64-->2.08. DD include occult infection and adrenal insufficiency. Pt does not meet criteria for sepsis. Blood pressure is soft, but hemodynamically stable currently. -IV vanco and zosyn were started in ED, will continue -give one dose of Solu Cortef 50 mg 1 -Check cortisol level -f/u UA, urine Cx and Bx. -will get Procalcitonin and trend lactic acid levels per sepsis protocol. -IVF: 3L of NS bolus in ED, followed by 125 cc/h   PAF: CHA2DS2-VASc Score is 4, needs oral anticoagulation, but pt is not on AC. According to his cardiologist, Dr. Antionette Char clinic note, his paroxysmal atrial fibrillation had "no recurrence since undergoing surgical Maze procedure". Heart rate is well controlled. -continue metoprolol  Coronary atherosclerosis of native coronary artery: s/p of stent and CABG. No CP -continue ASA and metoprolol  Type II diabetes mellitus with renal manifestations ([place]): Last A1c 6.7 on 05/27/13, well controled. Patient is taking actose and metformin at  home -SSI -Check A1c  HTN: bp is soft -hold home medications, cozarr due to soft bp and worsening renal Fx -IV hydralazine prn  GERD: -Protonix  Chronic kidney disease (CKD), stage II (mild): Baseline creatinine 1.1-1.2. His creatinine is 1.33, BUN 23, slightly worsening than her baseline.  -Hold Cozaar  -IV fluid as above -Follow-up by BMP    DVT ppx:    SQ Lovenox Code Status: Full code Family Communication:     Yes, patient's son   at bed side Disposition Plan:  Anticipate discharge back to previous home environment Consults called:  Neuro, Dr. Rory Percy Admission status: Obs / tele    Date of Service 11/10/2017    Ivor Costa [facility] Pager [phone]  If 7PM-7AM, please contact night-coverage [email or web address] Password [facility] 11/10/2017, 9:57 PM

## 2017-11-10 NOTE — ED Notes (Signed)
Neurologist at bedside. 

## 2017-11-10 NOTE — ED Notes (Signed)
Patient's son up to nurse first asking how much longer, this RN made pts son aware he is one of the next patients to go back.

## 2017-11-10 NOTE — ED Notes (Signed)
Patient is stable and ready to be transport to the floor at this time.  Report was called to 3W RN.  Belongings taken with the patient to the floor.   

## 2017-11-10 NOTE — Progress Notes (Addendum)
Pharmacy Antibiotic Note  [name] is a 76 y.o. male admitted on 11/10/2017 with sepsis.  Pharmacy has been consulted for Zosyn and vancomycin dosing.  Hypothermic, WBC wnl. TBW 105 kg last month. Normalized CrCl ~29ml/min  Plan: Start Zosyn 3.375 gm IV q8h (4 hour infusion) Give vancomycin 2g IV x 1, then start vancomycin 1,250mg  IV Q24h early tomorrow Monitor clinical picture, renal function, VT prn F/U C&S, abx deescalation / LOT     Temp (24hrs), Avg:95.4 F (35.2 C), Min:95.4 F (35.2 C), Max:95.4 F (35.2 C)  Recent Labs  Lab 11/10/17 1327 11/10/17 1357 11/10/17 1821  WBC  --  10.3  --   CREATININE 1.33*  --   --   LATICACIDVEN  --   --  2.64*    CrCl cannot be calculated (Unknown ideal weight.).    Allergies  Allergen Reactions  . Asa [Aspirin] Other (See Comments)    "Only can tolerate low dose" (specific reaction not recalled)  . Statins Other (See Comments)    Aches    Thank you for allowing pharmacy to be a part of this patient's care.  [name] 11/10/2017 7:03 PM
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
## 2017-11-10 NOTE — ED Notes (Signed)
No answer for vital signs.

## 2017-11-10 NOTE — Consult Note (Signed)
Neurology Consultation  Reason for Consult: Dizziness, generalized weakness Referring Physician: Dr. Wilson Singer / A.Harris PA-C  CC: Dizziness, generalized weakness  History is obtained from: Patient, patient's son at bedside, chart  HPI: [name] is a 75 y.o. male who has a past medical history of atrial fibrillation not on anticoagulation, hypertension, coronary artery disease, status post CABG, type 2 diabetes, obesity, sleep apnea, who was in his usual state of health until about 8 AM this morning when he started feeling nauseous, broke into cold sweat and felt generally weak and felt as if he is going to lose consciousness and passed out. He felt weak enough that he was unable to open the front door of his business.  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 He said that he felt initially like he would pass out but then he felt "dizzy", not has been room spinning or his head spinning but just feeling off balance.  The feeling of being off balance started about a couple of hours after his initial symptoms of cold sweat, nauseousness and generalized weakness started. He denied feeling as if he was on a boat unstable.  Also denied any room spinning.  No preceding illnesses.  Did receive a flu shot a week or so ago. Upon initial evaluation in the ER, he was hypothermic with an elevated lactate.  He is being admitted for infectious workup.  Neurology consultation was obtained because of the above-mentioned symptoms as well as possible nystagmus on exam noted by the ED providers and complaints of his speech being a little slurred in his face looking not normal noted by family.   LKW: 0800 hrs. on 11/10/2017 tpa given?: no, outside the window Premorbid modified Rankin scale (mRS): 0  ROS: A 14 point ROS was performed and is negative except as noted in the HPI.   Past Medical History:  Diagnosis Date  . Allergy   . Atrial fibrillation ([place])    persistent, failed DCCV;takes Metoprolol daily and taking Pacerone for 7days prior to surgery  . Bronchitis   . Cardiomyopathy, ischemic   . Complication of anesthesia    pt states he gets "crazy" after anesthesia  . Coronary artery disease   . Hay fever    takes Zyrtec daily  . History of bronchitis    last time a year ago  . History of kidney stones   . Hyperlipidemia    takes Zetia daily  . Myocardial infarction [facility]) 2008   inferior wall, treated with BMS in RCA  . Obesity (BMI 30-39.9)   . Pleural effusion, left 02/21/2013  . 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 S/P Maze operation for atrial fibrillation 01/27/2013   Complete bilateral lesion set using bipolar radiofrequency and cryothermy ablation with clipping of LA appendage  . Sleep apnea   . Type II diabetes mellitus (HCC)    takes Metformin and Januvia daily;pt states he is not a diabetic but pre diabetic    Family History  Problem Relation Age of Onset  . Heart disease Mother   . Diabetes Father   . Lung disease Father   . Cancer Father        colon    Social History:   reports that he quit smoking about 34 years ago. His smoking use included pipe and cigarettes. He has a 16.00 pack-year smoking history. he has never  used smokeless tobacco. He reports that he drinks alcohol. He reports that he does not use drugs.  Medications  Current Facility-Administered Medications:  .  meclizine (ANTIVERT) tablet 25 mg, 25 mg, Oral, Once, Harris, Abigail, PA-C .  [START ON 11/11/2017] piperacillin-tazobactam (ZOSYN) IVPB 3.375 g, 3.375 g, Intravenous, Q8H, Batchelder, Nathan J, RPH .  [START ON 11/11/2017] vancomycin (VANCOCIN) 1,250 mg in sodium chloride 0.9 % 250 mL IVPB, 1,250 mg, Intravenous, Q24H, Batchelder, Nathan J, RPH .  vancomycin (VANCOCIN) 2,000 mg in sodium chloride 0.9 % 500 mL IVPB, 2,000 mg, Intravenous, Once, Batchelder, Nathan J, RPH, Last Rate: 250 mL/hr at 11/10/17  1956, 2,000 mg at 11/10/17 1956  Current Outpatient Medications:  .  Ascorbic Acid (VITAMIN C) 1000 MG tablet, Take 1,000 mg by mouth daily., Disp: , Rfl:  .  aspirin EC 81 MG EC tablet, Take 1 tablet (81 mg total) by mouth daily., Disp: , Rfl:  .  cetirizine-pseudoephedrine (ZYRTEC-D) 5-120 MG tablet, Take 1 tablet by mouth 2 (two) times daily., Disp: , Rfl:  .  Cholecalciferol (VITAMIN D-3) 1000 units CAPS, Take 1,000 Units by mouth daily. , Disp: , Rfl:  .  CINNAMON PO, Take 2 capsules by mouth daily. , Disp: , Rfl:  .  Cyanocobalamin (B-12) 500 MCG TABS, Take 500 mcg by mouth daily., Disp: , Rfl:  .  gabapentin (NEURONTIN) 100 MG capsule, Take 2 capsules (200 mg total) by mouth 3 (three) times daily. (Patient taking differently: Take 200 mg by mouth at bedtime. ), Disp: 180 capsule, Rfl: 3 .  Glucosamine-Chondroit-Vit C-Mn (GLUCOSAMINE 1500 COMPLEX PO), Take 1 tablet by mouth daily., Disp: , Rfl:  .  losartan (COZAAR) 25 MG tablet, Take 1 tablet (25 mg total) by mouth daily., Disp: 90 tablet, Rfl: 3 .  metFORMIN (GLUCOPHAGE) 500 MG tablet, Take 500 mg by mouth 2 (two) times daily with a meal. , Disp: , Rfl:  .  metoprolol succinate (TOPROL-XL) 25 MG 24 hr tablet, Take 25 mg by mouth daily., Disp: , Rfl:  .  montelukast (SINGULAIR) 10 MG tablet, Take 1 tablet (10 mg total) by mouth at bedtime., Disp: 90 tablet, Rfl: 3 .  Multiple Vitamins-Minerals (MULTIVITAMIN WITH MINERALS) tablet, Take 1 tablet by mouth daily., Disp: , Rfl:  .  olopatadine (PATANOL) 0.1 % ophthalmic solution, Place 1 drop into both eyes 2 (two) times daily. (Patient taking differently: Place 1 drop into both eyes 4 (four) times daily as needed for allergies. ), Disp: 5 mL, Rfl: 5 .  pantoprazole (PROTONIX) 40 MG tablet, Take 1 tablet (40 mg total) by mouth daily. Take 30-60 min before first meal of the day, Disp: 90 tablet, Rfl: 3 .  pioglitazone (ACTOS) 45 MG tablet, Take 45 mg by mouth daily., Disp: , Rfl: 4 .   triamcinolone cream (KENALOG) 0.1 %, Apply 1 application topically 2 (two) times daily as needed (for irritation). , Disp: , Rfl: 0 .  Azelastine-Fluticasone (DYMISTA) 137-50 MCG/ACT SUSP, Place 1 spray into the nose 2 (two) times daily. (Patient not taking: Reported on 11/10/2017), Disp: 1 Bottle, Rfl: 11 .  famotidine (PEPCID) 20 MG tablet, One at bedtime (Patient not taking: Reported on 11/10/2017), Disp: , Rfl:  .  glucose blood (CHOICE DM FORA G20 TEST STRIPS) test strip, Use as instructed, Disp: 100 each, Rfl: 12 .  glucose monitoring kit (FREESTYLE) monitoring kit, 1 each by Does not apply route as needed for other., Disp: 1 each, Rfl: 0 .  Lancets (ONETOUCH ULTRASOFT)   lancets, Use as instructed, Disp: 100 each, Rfl: 12  Exam: Current vital signs: BP (!) 105/56   Pulse 61   Temp 97.6 F (36.4 C) (Oral)   Resp 10   SpO2 95%  Vital signs in last 24 hours: Temp:  [95.4 F (35.2 C)-97.6 F (36.4 C)] 97.6 F (36.4 C) (12/11 1950) Pulse Rate:  [54-61] 61 (12/11 2000) Resp:  [10-14] 10 (12/11 2000) BP: (104-134)/(54-82) 105/56 (12/11 2000) SpO2:  [85 %-97 %] 95 % (12/11 2000)  GENERAL: Awake, alert in NAD HEENT: - Normocephalic and atraumatic, dry mm, no LN++, no Thyromegally LUNGS - Clear to auscultation bilaterally with no wheezes CV - S1S2 RRR, no m/r/g, equal pulses bilaterally. ABDOMEN - Soft, nontender, nondistended with normoactive BS Ext: warm, well perfused, intact peripheral pulses, no edema  NEURO:  Mental Status: AA&Ox3  Language: speech is mildly dysarthric.  Naming, repetition, fluency, and comprehension intact. Cranial Nerves: PERRL. EOMI but there is an gaze rotatory nystagmus on rightward gaze, no skew deviation, head impulse negative,, visual fields full, no facial asymmetry, facial sensation intact, hearing intact, tongue/uvula/soft palate midline, normal sternocleidomastoid and trapezius muscle strength. No evidence of tongue atrophy or fibrillations Motor: 5/5  with no vertical drift in all of the 4 extremities. Tone: is normal and bulk is normal Sensation- Intact to light touch bilaterally Coordination: FTN intact bilaterally, no ataxia in BLE. Gait- deferred DTR: Absent all over  NIHSS 1a Level of Conscious.: 0 1b LOC Questions: 0 1c LOC Commands: 0 2 Best Gaze: 0 3 Visual: 0 4 Facial Palsy: 0 5a Motor Arm - left: 0 5b Motor Arm - Right: 0 6a Motor Leg - Left: 0 6b Motor Leg - Right: 0 7 Limb Ataxia: 0 8 Sensory: 0 9 Best Language: 0 10 Dysarthria: 1 11 Extinct. and Inatten.: 0 TOTAL: 1  Labs I have reviewed labs in epic and the results pertinent to this consultation are:  CBC    Component Value Date/Time   WBC 10.3 11/10/2017 1357   RBC 4.72 11/10/2017 1357   HGB 14.5 11/10/2017 1357   HCT 43.9 11/10/2017 1357   PLT 178 11/10/2017 1357   MCV 93.0 11/10/2017 1357   MCH 30.7 11/10/2017 1357   MCHC 33.0 11/10/2017 1357   RDW 13.6 11/10/2017 1357   LYMPHSABS 2.0 11/13/2016 0955   MONOABS 0.6 11/13/2016 0955   EOSABS 0.6 11/13/2016 0955   BASOSABS 0.0 11/13/2016 0955    CMP     Component Value Date/Time   NA 134 (L) 11/10/2017 1327   K 4.0 11/10/2017 1327   CL 99 (L) 11/10/2017 1327   CO2 22 11/10/2017 1327   GLUCOSE 180 (H) 11/10/2017 1327   BUN 23 (H) 11/10/2017 1327   CREATININE 1.33 (H) 11/10/2017 1327   CALCIUM 9.0 11/10/2017 1327   PROT 6.7 08/30/2015 0741   ALBUMIN 3.9 08/30/2015 0741   AST 19 08/30/2015 0741   ALT 21 08/30/2015 0741   ALKPHOS 44 08/30/2015 0741   BILITOT 0.8 08/30/2015 0741   GFRNONAA 51 (L) 11/10/2017 1327   GFRAA 59 (L) 11/10/2017 1327    Lipid Panel     Component Value Date/Time   CHOL 189 08/30/2015 0741   TRIG 104.0 08/30/2015 0741   HDL 35.40 (L) 08/30/2015 0741   CHOLHDL 5 08/30/2015 0741   VLDL 20.8 08/30/2015 0741   LDLCALC 133 (H) 08/30/2015 0741     Imaging I have reviewed the images obtained: CT-scan of the brain -no bleed, no acute   changes, chronic white  matter changes.   Assessment:  76 year old man with a history of CAD, atrial fibrillation not on anticoagulant, hypertension, type 2 diabetes, sleep apnea, obesity with complaints of generalized weakness, nausea, lightheadedness/dizziness and disequilibrium who was noted to have rotatory nystagmus on rightward gaze and dysarthria. I did not personally appreciate any facial asymmetry on my examination but the son said that his face at times looked uneven. Given that his symptoms have been constant and are not related to position, I would suspect a posterior circulation stroke that might be the cause of these. Also, he is currently being evaluated for infectious process given the elevated lactate and hypothermia.  This may be contributing to his current presentation as well.  Outside the window for TPA.  Exam not suggestive of large vessel occlusion hence not an endovascular candidate.  Impression: Evaluate for posterior circulation stroke Evaluate for other peripheral causes of dizziness Possible sepsis AKI  Recommendations: -At this time, I would recommend obtaining an MRI of the brain without contrast. -I would also recommend obtaining an MRA of the head and neck to evaluate his cervical and cerebral vasculature.  I would prefer a CT angiogram but he has slightly deranged renal function and I would hold off on the CT angiogram but because of that reason. -Meclizine as needed for symptomatic management of the dizziness -PT/OT for vestibular rehab -If the MRI is positive for stroke, he would need stroke risk factor workup.  Because of the history of A. fib, I would recommend that he be on telemetry irrespective.  In case of the MRI being positive for stroke, he will require an echocardiogram, A1c and lipid panel as well. -Correction of metabolic abnormalities per primary team -Neurology will follow with you after imaging studies are made available.  -- Amie Portland, MD [facility] [phone] If 7pm to 7am, please call on call as listed on AMION.

## 2017-11-10 NOTE — ED Notes (Signed)
Patient transported to CT 

## 2017-11-10 NOTE — ED Notes (Signed)
Patient given ice and coke by MD Wilson Singer

## 2017-11-10 NOTE — ED Notes (Signed)
pts son up to nf again asking when pt will go back, advised we are still working to get a room but he will be one of the next few people to go back to be seen.

## 2017-11-10 NOTE — ED Notes (Signed)
Patient aware of need for urine sample, unable to give one at this time

## 2017-11-10 NOTE — ED Provider Notes (Addendum)
[facility]    CSN: [id number] Arrival date & time: 11/10/17  1158     History   Chief Complaint Chief Complaint  Patient presents with  . Nausea  . Generalized Body Aches  . Emesis    HPI JOSHUA SOULIER is a 76 y.o. male.   HPI ROMON DEVEREUX is a 76 y.o. male presenting to UC with c/o worsening generalized weakness and lightheadedness for the last 3 hours. Associated nausea and 1 episode of vomiting while checking into UC this morning.  Hx of CAD and CABG. Last routine visit with cardiology was about 4-60moago. Pt stated "everything was fine." denies chest pain, SOB, HA, or dizziness. Denies recent falls. Denies numbness or tingling in arms or legs. He was on a tractor all day yesterday pushing snow but no shoveling.  This morning pt states he felt too weak to do anything but did drive himself to UC.   Past Medical History:  Diagnosis Date  . Allergy   . Atrial fibrillation ([place]    persistent, failed DCCV;takes Metoprolol daily and taking Pacerone for 7days prior to surgery  . Bronchitis   . Cardiomyopathy, ischemic   . Complication of anesthesia    pt states he gets "crazy" after anesthesia  . Coronary artery disease   . Hay fever    takes Zyrtec daily  . History of bronchitis    last time a year ago  . History of kidney stones   . Hyperlipidemia    takes Zetia daily  . Myocardial infarction ([facility] 2008   inferior wall, treated with BMS in RCA  . Obesity (BMI 30-39.9)   . Pleural effusion, left 02/21/2013  . 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 S/P Maze operation for atrial fibrillation 01/27/2013   Complete bilateral lesion set using bipolar radiofrequency and cryothermy ablation with clipping of LA appendage  . Sleep apnea   . Type II diabetes mellitus (HCC)    takes Metformin and Januvia daily;pt states he is not a diabetic but pre diabetic    Patient Active Problem List   Diagnosis Date Noted  .  Biceps tendon tear 07/13/2017  . Degenerative disc disease, lumbar 11/26/2016  . Cyst of maxillary sinus 11/14/2016  . Upper airway cough syndrome 11/13/2016  . Right rotator cuff tear 12/20/2015  . Low back pain 12/20/2015  . Left knee pain 12/20/2015  . Pleural effusion, left 02/21/2013  . 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 S/P Maze operation for atrial fibrillation 01/27/2013  . Type II diabetes mellitus ([place] 01/19/2013  . Hyperlipidemia 01/19/2013  . Morbid obesity due to excess calories ([place] 01/19/2013  . Hypertension 01/19/2013  . OSA (obstructive sleep apnea) 12/10/2012  . Coronary atherosclerosis of native coronary artery 12/09/2012  . Cardiomyopathy, ischemic 12/09/2012  . Atrial fibrillation ([place] 10/25/2012    Past Surgical History:  Procedure Laterality Date  . ADENOIDECTOMY    . CARDIOVERSION  11/30/2012   Procedure: CARDIOVERSION;  Surgeon: MSherren Mocha MD;  [facility];  Service: Cardiovascular;  Laterality: [name];  . CORONARY ANGIOPLASTY WITH STENT PLACEMENT  01/17/13 and in 2008   1 stent placed in 2008  . CORONARY ARTERY BYPASS GRAFT [name] 01/27/2013   Procedure: CORONARY ARTERY BYPASS GRAFTING (CABG) x  two; using left internal mammary artery and right leg greater saphenous vein harvested endoscopically;  Surgeon: CRexene Alberts MD;  [facility]  Service:  Open Heart Surgery;  Laterality: [name];  . CYSTOSCOPY    . HERNIA REPAIR  12/18/2009   Incarcerated umbilical hernia  . INTRAOPERATIVE TRANSESOPHAGEAL ECHOCARDIOGRAM [name] 01/27/2013   Procedure: INTRAOPERATIVE TRANSESOPHAGEAL ECHOCARDIOGRAM;  Surgeon: Rexene Alberts, MD;  Location: [place];  Service: Open Heart Surgery;  Laterality: [name];  . MAZE [name] 01/27/2013   Procedure: MAZE;  Surgeon: Rexene Alberts, MD;  Location: [place];  Service: Open Heart Surgery;  Laterality: [name];  . NASAL SEPTUM SURGERY  2004   and sinus repair penta  . SINOSCOPY  2000  . skin taken off of top of mouth and reapplied to gum      . TONSILLECTOMY  1952  . TONSILLECTOMY    . UMBILICAL HERNIA REPAIR  01/2010       Home Medications    Prior to Admission medications   Medication Sig Start Date End Date Taking? Authorizing Provider  Ascorbic Acid (VITAMIN C) 1000 MG tablet Take 1,000 mg by mouth daily.    [provider]  aspirin EC 81 MG EC tablet Take 1 tablet (81 mg total) by mouth daily. 02/01/13   Barrett, Lodema Hong, PA-C  Azelastine-Fluticasone (DYMISTA) 137-50 MCG/ACT SUSP Place 1 spray into the nose 2 (two) times daily. 12/23/16   Tanda Rockers, MD  cetirizine-pseudoephedrine (ZYRTEC-D) 5-120 MG tablet Take 1 tablet by mouth 2 (two) times daily.    [provider]  Cholecalciferol (VITAMIN D-3) 1000 units CAPS Take 2,000 Units by mouth daily.    [provider]  CINNAMON PO Take 1 capsule by mouth daily.    [provider]  cyanocobalamin 500 MCG tablet Take 500 mcg by mouth daily.    [provider]  famotidine (PEPCID) 20 MG tablet One at bedtime 11/13/16   Tanda Rockers, MD  gabapentin (NEURONTIN) 100 MG capsule Take 2 capsules (200 mg total) by mouth 3 (three) times daily. 09/21/17   Lyndal Pulley, DO  Glucosamine-Chondroit-Vit C-Mn (GLUCOSAMINE 1500 COMPLEX PO) Take 1 tablet by mouth daily.    [provider]  glucose blood (CHOICE DM FORA G20 TEST STRIPS) test strip Use as instructed 02/04/13   Barrett, Gionni Vaca R, PA-C  glucose monitoring kit (FREESTYLE) monitoring kit 1 each by Does not apply route as needed for other. 02/04/13   Barrett, Elfreida Heggs R, PA-C  Lancets (ONETOUCH ULTRASOFT) lancets Use as instructed 02/04/13   Barrett, Hakeem Frazzini R, PA-C  losartan (COZAAR) 25 MG tablet Take 1 tablet (25 mg total) by mouth daily. 04/26/13   Sherren Mocha, MD  metFORMIN (GLUCOPHAGE) 500 MG tablet Take 250 mg by mouth 2 (two) times daily with a meal.     [provider]  metoprolol succinate (TOPROL-XL) 25 MG 24 hr tablet Take 25 mg by mouth daily.    [provider]  montelukast (SINGULAIR) 10 MG tablet Take 1 tablet (10 mg total) by mouth at bedtime. 04/16/17   Kozlow, Donnamarie Poag, MD  Multiple Vitamins-Minerals (MULTIVITAMIN WITH MINERALS) tablet Take 1 tablet by mouth daily.    [provider]  olopatadine (PATANOL) 0.1 % ophthalmic solution Place 1 drop into both eyes 2 (two) times daily. 04/16/17   Kozlow, Donnamarie Poag, MD  pantoprazole (PROTONIX) 40 MG tablet Take 1 tablet (40 mg total) by mouth daily. Take 30-60 min before first meal of the day 04/16/17   Kozlow, Donnamarie Poag, MD  triamcinolone cream (KENALOG) 0.1 % Apply 1 application topically 2 (two) times daily. 03/10/16   [provider]    Family History Family History  Problem Relation Age of Onset  . Heart disease Mother   . Diabetes Father   . Lung disease Father   . Cancer Father        colon    Social History Social History   Tobacco Use  . Smoking status: Former Smoker    Packs/day: 0.50    Years: 32.00    Pack years: 16.00    Types: Pipe, Cigarettes    Last attempt to quit: 12/01/1982    Years since quitting: 34.9  . Smokeless tobacco: Never Used  . Tobacco comment: quit in 1980  Substance Use Topics  . Alcohol use: Yes    Comment: beer occasionally  . Drug use: No     Allergies   Asa [aspirin] and Statins   Review of Systems Review of Systems  Constitutional: Positive for fatigue. Negative for chills, diaphoresis and fever.  HENT: Negative for congestion, ear pain, sore throat, trouble swallowing and voice change.   Respiratory: Negative for cough and shortness of breath.   Cardiovascular: Negative for chest pain and palpitations.  Gastrointestinal: Positive for nausea and vomiting. Negative for abdominal pain and diarrhea.  Musculoskeletal: Negative for arthralgias, back pain and myalgias.  Skin: Negative for rash.  Neurological: Positive for syncope (near-syncope), weakness (generalized) and light-headedness. Negative for dizziness, seizures,  numbness and headaches.     Physical Exam Triage Vital Signs ED Triage Vitals  Enc Vitals Group     BP      Pulse      Resp      Temp      Temp src      SpO2      Weight      Height      Head Circumference      Peak Flow      Pain Score      Pain Loc      Pain Edu?      Excl. in [place]?    No data found.  Updated Vital Signs BP 134/82 (BP Location: Right Arm)   Pulse (!) 54   SpO2 97%   Visual Acuity Right Eye Distance:   Left Eye Distance:   Bilateral Distance:    Right Eye Near:   Left Eye Near:    Bilateral Near:     Physical Exam  Constitutional: He is oriented to person, place, and time. He appears well-developed and well-nourished. He appears lethargic. No distress.  Elderly male sitting in exam chair, appears lethargic but cooperative during exam   HENT:  Head: Normocephalic and atraumatic.  Eyes: EOM are normal.  Neck: Normal range of motion.  Cardiovascular: Regular rhythm. Bradycardia present.  Pulses:      Radial pulses are 2+ on the right side, and 2+ on the left side.  Mild bradycardia  Pulmonary/Chest: Effort normal and breath sounds normal. No stridor. No respiratory distress. He has no wheezes. He has no rales.  Musculoskeletal: Normal range of motion.  Slowed but full ROM upper and lower extremities with 5/5 grip strength bilaterally.  Neurological: He is oriented to person, place, and time. He appears lethargic.  Lethargic. CN II-XII grossly in tact. Speech is clear. Oriented to person, place, and time but slowed to answer questions or perform movements. Questions needed repeating several times.  Skin: Skin is warm and dry. No rash noted. He is not diaphoretic. No erythema. There is pallor.  Psychiatric: He has a normal mood  and affect. His behavior is normal.  Nursing note and vitals reviewed.    UC Treatments / Results  Labs (all labs ordered are listed, but only abnormal results are displayed) Labs Reviewed - No data to display  EKG   EKG Interpretation None       Radiology No results found.  Procedures Procedures (including critical care time)  Medications Ordered in UC Medications - No data to display   Initial Impression / Assessment and Plan / UC Course  I have reviewed the triage vital signs and the nursing notes.  Pertinent labs & imaging results that were available during my care of the patient were reviewed by me and considered in my medical decision making (see chart for details).     Pt appears unwell. Severe weakness and fatigue.  Vomited once in waiting room. Appears pale. ECG: unremarkable compared to prior ECGs. Vitals: mild bradycardia EMS called to bring pt to [facility] Emergency Department for further evaluation and treatment.  Question possible CO toxicity, viral illness, accidental overdose on prescription medications?  Final Clinical Impressions(s) / UC Diagnoses   Final diagnoses:  Generalized weakness  Nausea and vomiting in adult    ED Discharge Orders    None       Controlled Substance Prescriptions [place] Controlled Substance Registry consulted? Not Applicable   Tyrell Antonio 11/10/17 1432    Noe Gens, PA-C 11/10/17 1433

## 2017-11-10 NOTE — ED Triage Notes (Signed)
Patient from home after waking with generalized weakness, nausea, and vomiting. States he had no symptoms yesterday.  Went to Urgent Care in [place], was sent here via [facility] for further evaluation due to cardiac history. Patient is alert and oriented at this time, ambulated independently but slowly from EMS stretcher to triage room.

## 2017-11-11 ENCOUNTER — Encounter (HOSPITAL_COMMUNITY): Payer: Self-pay | Admitting: General Practice

## 2017-11-11 ENCOUNTER — Observation Stay (HOSPITAL_COMMUNITY): Payer: Medicare HMO

## 2017-11-11 ENCOUNTER — Other Ambulatory Visit: Payer: Self-pay

## 2017-11-11 DIAGNOSIS — R42 Dizziness and giddiness: Secondary | ICD-10-CM | POA: Diagnosis not present

## 2017-11-11 DIAGNOSIS — E860 Dehydration: Secondary | ICD-10-CM | POA: Diagnosis not present

## 2017-11-11 DIAGNOSIS — N182 Chronic kidney disease, stage 2 (mild): Secondary | ICD-10-CM | POA: Diagnosis not present

## 2017-11-11 DIAGNOSIS — R479 Unspecified speech disturbances: Secondary | ICD-10-CM | POA: Diagnosis not present

## 2017-11-11 LAB — LIPID PANEL
Cholesterol: 202 mg/dL — ABNORMAL HIGH (ref 0–200)
HDL: 44 mg/dL (ref >40)
LDL Cholesterol: 152 mg/dL — ABNORMAL HIGH (ref 0–99)
Total CHOL/HDL Ratio: 4.6 RATIO
Triglycerides: 28 mg/dL (ref <150)
VLDL: 6 mg/dL (ref 0–40)

## 2017-11-11 LAB — URINALYSIS, ROUTINE W REFLEX MICROSCOPIC
Bilirubin Urine: NEGATIVE
Glucose, UA: NEGATIVE mg/dL
Hgb urine dipstick: NEGATIVE
Ketones, ur: 5 mg/dL — AB
Leukocytes, UA: NEGATIVE
Nitrite: NEGATIVE
Protein, ur: NEGATIVE mg/dL
Specific Gravity, Urine: 1.027 (ref 1.005–1.030)
pH: 5 (ref 5.0–8.0)

## 2017-11-11 LAB — GLUCOSE, CAPILLARY
Glucose-Capillary: 125 mg/dL — ABNORMAL HIGH (ref 65–99)
Glucose-Capillary: 123 mg/dL — ABNORMAL HIGH (ref 65–99)
Glucose-Capillary: 147 mg/dL — ABNORMAL HIGH (ref 65–99)
Glucose-Capillary: 134 mg/dL — ABNORMAL HIGH (ref 65–99)

## 2017-11-11 LAB — LIPASE, BLOOD: Lipase: 22 U/L (ref 11–51)

## 2017-11-11 LAB — CORTISOL-AM, BLOOD: Cortisol - AM: 93.8 ug/dL — ABNORMAL HIGH (ref 6.7–22.6)

## 2017-11-11 LAB — LACTIC ACID, PLASMA
Lactic Acid, Venous: 1.4 mmol/L (ref 0.5–1.9)
Lactic Acid, Venous: 1 mmol/L (ref 0.5–1.9)

## 2017-11-11 LAB — HEMOGLOBIN A1C
Hgb A1c MFr Bld: 6.3 % — ABNORMAL HIGH (ref 4.8–5.6)
Mean Plasma Glucose: 134.11 mg/dL

## 2017-11-11 LAB — PROCALCITONIN: Procalcitonin: <0.1 ng/mL

## 2017-11-11 LAB — CK: Total CK: 90 U/L (ref 49–397)

## 2017-11-11 MED ORDER — GADOBENATE DIMEGLUMINE 529 MG/ML IV SOLN
20.0000 mL | Freq: Once | INTRAVENOUS | Status: AC
  Administered 2017-11-11: 20 mL via INTRAVENOUS

## 2017-11-11 MED ORDER — GADOBENATE DIMEGLUMINE 529 MG/ML IV SOLN: 20.0000 mL | Freq: Once | INTRAVENOUS | Status: DC | PRN

## 2017-11-11 MED ORDER — METOPROLOL SUCCINATE ER 25 MG PO TB24
25.0000 mg | ORAL_TABLET | Freq: Every day | ORAL | Status: DC
  Administered 2017-11-12: 25 mg via ORAL
  Filled 2017-11-11: qty 1

## 2017-11-11 NOTE — Progress Notes (Signed)
PROGRESS NOTE    [name]  KAJ:[id number] DOB: 07-Feb-1941 DOA: 11/10/2017 PCP: Lorene Dy, MD   Outpatient Specialists:     Brief Narrative:  [name] is a 76 y.o. male with medical history significant of hypertension, hyperlipidemia, diabetes mellitus, GERD, OSA no on CPAP, obesity, atrial fibrillation not on [facility], s/p of maze, CAD, s/p of stent placement, s/p of CABG, CKD-2, who presents with difficulty speaking, dizziness, poor balance,  generalized weakness, nausea, vomiting and diaphoresis.  Per pt's son. pt was in his usual health state until about 8 AM this morning when he started feeling nauseous and vomited once. He had mild abdominal pain, which has resolved. No diarrhea. No symptoms of UTI. No fever or chills. Pt has generalized weakness, but no unilateral weakness, numbness or tingling in extremities.  No vision change or hearing loss. He has dizziness and poor balance. No feeling of room spinning. No ear ringing. He was found to have difficulty speaking by his son. Pt denies chest pain, shortness of breath, cough. Pt was seen in [place] urgent care, and was sent to ED for evaluation.     Assessment & Plan:   Principal Problem:   Difficulty speaking Active Problems:   Atrial fibrillation (HCC)   Coronary atherosclerosis of native coronary artery   Type II diabetes mellitus with renal manifestations (HCC)   Hyperlipidemia   Hypertension   S/P CABG x 2   S/P Maze operation for atrial fibrillation   Dizziness   Chronic kidney disease (CKD), stage II (mild)   GERD (gastroesophageal reflux disease)   Hypothermia   Difficulty speaking, dizziness and poor balance:  -etiology unclear -seen by neuro, MRI/MRA done: no concerning areas -suspect due to dehydration--- avoid in future -PT/OT-- outpatient PT  Hypothermia: E -unclear etiology -was outside for a prolonged period of time on a machine plowing snow  PAF: CHA2DS2-VASc Score is 4 -  According to his cardiologist, Dr. Antionette Char clinic note, his paroxysmal atrial fibrillation had "no recurrence since undergoing surgical Maze procedure". Heart rate is well controlled. -continue metoprolol  Coronary atherosclerosis of native coronary artery: s/p of stent and CABG. No CP -continue ASA and metoprolol  Type II diabetes mellitus with renal manifestations [facility]): Patient is taking actose and metformin at home -only checks if he feels bad- light headed etc-- usually 140s  HTN: bp is soft -holding parameters on meds  GERD: -Protonix  Chronic kidney disease (CKD), stage II (mild): Baseline creatinine 1.1-1.2. His creatinine is 1.33, BUN 23, slightly worsening than her baseline.  -IVF -recheck labs in AM      DVT prophylaxis:  Lovenox   Code Status: Full Code   Family Communication: Son at bedside  Disposition Plan:  Home in AM if BP improved   Consultants:   neuro   Subjective: Has not been urinating a lot here and when he does, it is dark  Objective: Vitals:   11/11/17 0500 11/11/17 1006 11/11/17 1007 11/11/17 1406  BP: 112/67 (!) 100/58    Pulse: 65  73   Resp: 18  20 20   Temp: 98.3 F (36.8 C)  98.1 F (36.7 C) 98.1 F (36.7 C)  TempSrc: Oral  Oral Oral  SpO2: 93%  98% 100%  Weight:      Height:        Intake/Output Summary (Last 24 hours) at 11/11/2017 1635 Last data filed at 11/11/2017 1402 Gross per 24 hour  Intake 4590.42 ml  Output 720 ml  Net 3870.42  ml   Filed Weights   11/10/17 2302  Weight: 109.1 kg (240 lb 8 oz)    Examination:  General exam: Appears calm and comfortable  Respiratory system: no wheezing, clear entry Cardiovascular system: S1 & S2 heard, RRR. No JVD, murmurs, rubs, gallops or clicks. No pedal edema. Gastrointestinal system: Abdomen is nondistended, soft and nontender. No organomegaly or masses felt. Normal bowel sounds heard. Central nervous system: Alert and oriented. Speech mildly  slurred Extremities: Symmetric 5 x 5 power. Skin: No rashes, lesions or ulcers Psychiatry: Judgement and insight appear normal. Mood & affect appropriate.     Data Reviewed: I have personally reviewed following labs and imaging studies  CBC: Recent Labs  Lab 11/10/17 1357  WBC 10.3  HGB 14.5  HCT 43.9  MCV 93.0  PLT 326   Basic Metabolic Panel: Recent Labs  Lab 11/10/17 1327  NA 134*  K 4.0  CL 99*  CO2 22  GLUCOSE 180*  BUN 23*  CREATININE 1.33*  CALCIUM 9.0   GFR: Estimated Creatinine Clearance: 60.2 mL/min (A) (by C-G formula based on SCr of 1.33 mg/dL (H)). Liver Function Tests: No results for input(s): AST, ALT, ALKPHOS, BILITOT, PROT, ALBUMIN in the last 168 hours. Recent Labs  Lab 11/10/17 2319  LIPASE 22   No results for input(s): AMMONIA in the last 168 hours. Coagulation Profile: Recent Labs  Lab 11/10/17 2319  INR 1.07   Cardiac Enzymes: Recent Labs  Lab 11/10/17 2319  CKTOTAL 90   BNP (last 3 results) No results for input(s): PROBNP in the last 8760 hours. HbA1C: Recent Labs    11/11/17 0244  HGBA1C 6.3*   CBG: Recent Labs  Lab 11/10/17 1805 11/10/17 2316 11/11/17 0616 11/11/17 1111  GLUCAP 158* 141* 125* 123*   Lipid Profile: Recent Labs    11/11/17 0244  CHOL 202*  HDL 44  LDLCALC 152*  TRIG 28  CHOLHDL 4.6   Thyroid Function Tests: No results for input(s): TSH, T4TOTAL, FREET4, T3FREE, THYROIDAB in the last 72 hours. Anemia Panel: No results for input(s): VITAMINB12, FOLATE, FERRITIN, TIBC, IRON, RETICCTPCT in the last 72 hours. Urine analysis:    Component Value Date/Time   COLORURINE YELLOW 11/10/2017 1325   APPEARANCEUR CLOUDY (A) 11/10/2017 1325   LABSPEC 1.027 11/10/2017 1325   PHURINE 5.0 11/10/2017 1325   GLUCOSEU NEGATIVE 11/10/2017 1325   HGBUR NEGATIVE 11/10/2017 1325   BILIRUBINUR NEGATIVE 11/10/2017 1325   KETONESUR 5 (A) 11/10/2017 1325   PROTEINUR NEGATIVE 11/10/2017 1325   UROBILINOGEN 0.2  03/04/2015 0751   NITRITE NEGATIVE 11/10/2017 1325   LEUKOCYTESUR NEGATIVE 11/10/2017 1325   Sepsis Labs: @LABRCNTIP (procalcitonin:4,lacticidven:4)  ) Recent Results (from the past 240 hour(s))  Blood Culture (routine x 2)     Status: None (Preliminary result)   Collection Time: 11/10/17  6:00 PM  Result Value Ref Range Status   Specimen Description BLOOD LEFT HAND  Final   Special Requests BLOOD AEROBIC BOTTLE Blood Culture adequate volume  Final   Culture NO GROWTH < 24 HOURS  Final   Report Status PENDING  Incomplete  Blood Culture (routine x 2)     Status: None (Preliminary result)   Collection Time: 11/10/17  6:30 PM  Result Value Ref Range Status   Specimen Description BLOOD LEFT ANTECUBITAL  Final   Special Requests   Final    BOTTLES DRAWN AEROBIC AND ANAEROBIC Blood Culture adequate volume   Culture NO GROWTH < 24 HOURS  Final   Report Status  PENDING  Incomplete      Anti-infectives (From admission, onward)   Start     Dose/Rate Route Frequency Ordered Stop   11/11/17 2030  vancomycin (VANCOCIN) 1,250 mg in sodium chloride 0.9 % 250 mL IVPB  Status:  Discontinued     1,250 mg 166.7 mL/hr over 90 Minutes Intravenous Every 24 hours 11/10/17 1921 11/11/17 1309   11/11/17 0800  vancomycin (VANCOCIN) 1,250 mg in sodium chloride 0.9 % 250 mL IVPB  Status:  Discontinued     1,250 mg 166.7 mL/hr over 90 Minutes Intravenous Every 24 hours 11/10/17 1907 11/10/17 1921   11/11/17 0100  piperacillin-tazobactam (ZOSYN) IVPB 3.375 g  Status:  Discontinued     3.375 g 12.5 mL/hr over 240 Minutes Intravenous Every 8 hours 11/10/17 1907 11/11/17 1309   11/10/17 1930  vancomycin (VANCOCIN) 2,000 mg in sodium chloride 0.9 % 500 mL IVPB     2,000 mg 250 mL/hr over 120 Minutes Intravenous  Once 11/10/17 1921 11/10/17 2156   11/10/17 1845  piperacillin-tazobactam (ZOSYN) IVPB 3.375 g     3.375 g 100 mL/hr over 30 Minutes Intravenous  Once 11/10/17 1837 11/10/17 1948   11/10/17 1845   vancomycin (VANCOCIN) IVPB 1000 mg/200 mL premix  Status:  Discontinued     1,000 mg 200 mL/hr over 60 Minutes Intravenous  Once 11/10/17 1837 11/10/17 1921       Radiology Studies: Ct Head Wo Contrast  Result Date: 11/10/2017 CLINICAL DATA:  Generalized weakness. EXAM: CT HEAD WITHOUT CONTRAST TECHNIQUE: Contiguous axial images were obtained from the base of the skull through the vertex without intravenous contrast. COMPARISON:  CT scan of August 21, 2013. FINDINGS: Brain: Mild diffuse cortical atrophy is noted. Old left cerebellar infarction is noted. Mild chronic ischemic white matter disease is noted. No mass effect or midline shift is noted. Ventricular size is within normal limits. There is no evidence of mass lesion, hemorrhage or acute infarction. Vascular: No hyperdense vessel or unexpected calcification. Skull: Normal. Negative for fracture or focal lesion. Sinuses/Orbits: Mucous retention cysts are noted in both maxillary sinuses. Other: None. IMPRESSION: Mild diffuse cortical atrophy. Mild chronic ischemic white matter disease. No acute intracranial abnormality seen. Electronically Signed   By: Marijo Conception, M.D.   On: 11/10/2017 19:56   Mr Angiogram Head Wo Contrast  Result Date: 11/11/2017 CLINICAL DATA:  Nystagmus. Dysarthria. Generalized weakness, nausea, dizziness, and disequilibrium. History of atrial fibrillation not on anticoagulation. EXAM: MR HEAD WITHOUT CONTRAST MR CIRCLE OF WILLIS WITHOUT CONTRAST MRA OF THE NECK WITHOUT AND WITH CONTRAST TECHNIQUE: Multiplanar, multiecho pulse sequences of the brain, circle of willis and surrounding structures were obtained without intravenous contrast. Angiographic images of the neck were obtained using MRA technique without and with intravenous contrast. CONTRAST:  16mL MULTIHANCE GADOBENATE DIMEGLUMINE 529 MG/ML IV SOLN COMPARISON:  Head CT 11/10/2017 FINDINGS: MR HEAD FINDINGS Brain: There is no evidence of acute infarct,  intracranial hemorrhage, mass, midline shift, or extra-axial fluid collection. The ventricles and sulci are within normal limits for age. Scattered small foci of T2 hyperintensity in the subcortical deep cerebral white matter bilaterally are nonspecific but compatible with mild chronic small vessel ischemic disease. Small chronic infarcts are noted in the right centrum semiovale and left cerebellum. Vascular: Major intracranial vascular flow voids are preserved. Skull and upper cervical spine: Unremarkable bone marrow signal. Sinuses/Orbits: Left larger than right maxillary sinus mucous retention cysts. Clear mastoid air cells. Unremarkable orbits. Other: None. MR CIRCLE OF WILLIS FINDINGS The  visualized distal left vertebral artery is widely patent and dominant and supplies the basilar artery. The right vertebral artery occludes just beyond the PICA origin. Patent PICA, AICA, and SCA origins are visualized bilaterally. The basilar artery is widely patent. There is a small left posterior communicating artery. The PCAs are patent without evidence of significant stenosis. The internal carotid arteries are widely patent from skullbase to carotid termini. The cavernous segments are slightly ectatic. The ACAs and MCAs are patent without evidence of proximal branch occlusion or significant stenosis. No aneurysm is identified. MRA NECK FINDINGS Motion artifact through the upper chest limits assessment of the aortic arch including the arch vessel origins. The brachiocephalic and subclavian arteries are grossly patent. The cervical carotid arteries are patent with limited assessment of the proximal common carotid arteries due to motion, particularly on the left. Otherwise, the common and cervical internal carotid arteries are widely patent without evidence of stenosis or dissection. The distal cervical ICAs are tortuous, particularly on the left. The vertebral arteries are patent in the neck with antegrade flow bilaterally.  The proximal V1 segments are not well evaluated due to motion, particularly on the right. The left vertebral artery is otherwise widely patent and strongly dominant. There are mild to moderate stenoses of the distal V3 and proximal V4 segments of the right vertebral artery, and the right vertebral artery is occluded with intermittent reconstitution distal to the PICA origin. IMPRESSION: 1. No acute intracranial abnormality. 2. Mild chronic small vessel ischemic disease. 3. Chronic left cerebellar infarct. 4. 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 5. Widely patent and strongly dominant left vertebral artery. 6. No evidence of significant cervical carotid artery or intracranial anterior circulation stenosis. Motion artifact limits assessment of the aortic arch and proximal arch vessels. Electronically Signed   By: Logan Bores M.D.   On: 11/11/2017 09:42   Mr Angiogram Neck W Or Wo Contrast  Result Date: 11/11/2017 CLINICAL DATA:  Nystagmus. Dysarthria. Generalized weakness, nausea, dizziness, and disequilibrium. History of atrial fibrillation not on anticoagulation. EXAM: MR HEAD WITHOUT CONTRAST MR CIRCLE OF WILLIS WITHOUT CONTRAST MRA OF THE NECK WITHOUT AND WITH CONTRAST TECHNIQUE: Multiplanar, multiecho pulse sequences of the brain, circle of willis and surrounding structures were obtained without intravenous contrast. Angiographic images of the neck were obtained using MRA technique without and with intravenous contrast. CONTRAST:  92mL MULTIHANCE GADOBENATE DIMEGLUMINE 529 MG/ML IV SOLN COMPARISON:  Head CT 11/10/2017 FINDINGS: MR HEAD FINDINGS Brain: There is no evidence of acute infarct, intracranial hemorrhage, mass, midline shift, or extra-axial fluid collection. The ventricles and sulci are within normal limits for age. Scattered small foci of T2 hyperintensity in the subcortical deep cerebral white matter bilaterally are nonspecific but compatible with mild chronic small  vessel ischemic disease. Small chronic infarcts are noted in the right centrum semiovale and left cerebellum. Vascular: Major intracranial vascular flow voids are preserved. Skull and upper cervical spine: Unremarkable bone marrow signal. Sinuses/Orbits: Left larger than right maxillary sinus mucous retention cysts. Clear mastoid air cells. Unremarkable orbits. Other: None. MR CIRCLE OF WILLIS FINDINGS The visualized distal left vertebral artery is widely patent and dominant and supplies the basilar artery. The right vertebral artery occludes just beyond the PICA origin. Patent PICA, AICA, and SCA origins are visualized bilaterally. The basilar artery is widely patent. There is a small left posterior communicating artery. The PCAs are patent without evidence of significant stenosis. The internal carotid arteries are widely patent from skullbase to carotid termini.  The cavernous segments are slightly ectatic. The ACAs and MCAs are patent without evidence of proximal branch occlusion or significant stenosis. No aneurysm is identified. MRA NECK FINDINGS Motion artifact through the upper chest limits assessment of the aortic arch including the arch vessel origins. The brachiocephalic and subclavian arteries are grossly patent. The cervical carotid arteries are patent with limited assessment of the proximal common carotid arteries due to motion, particularly on the left. Otherwise, the common and cervical internal carotid arteries are widely patent without evidence of stenosis or dissection. The distal cervical ICAs are tortuous, particularly on the left. The vertebral arteries are patent in the neck with antegrade flow bilaterally. The proximal V1 segments are not well evaluated due to motion, particularly on the right. The left vertebral artery is otherwise widely patent and strongly dominant. There are mild to moderate stenoses of the distal V3 and proximal V4 segments of the right vertebral artery, and the right  vertebral artery is occluded with intermittent reconstitution distal to the PICA origin. IMPRESSION: 1. No acute intracranial abnormality. 2. Mild chronic small vessel ischemic disease. 3. Chronic left cerebellar infarct. 4. 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 5. Widely patent and strongly dominant left vertebral artery. 6. No evidence of significant cervical carotid artery or intracranial anterior circulation stenosis. Motion artifact limits assessment of the aortic arch and proximal arch vessels. Electronically Signed   By: Logan Bores M.D.   On: 11/11/2017 09:42   Mr Brain Wo Contrast  Result Date: 11/11/2017 CLINICAL DATA:  Nystagmus. Dysarthria. Generalized weakness, nausea, dizziness, and disequilibrium. History of atrial fibrillation not on anticoagulation. EXAM: MR HEAD WITHOUT CONTRAST MR CIRCLE OF WILLIS WITHOUT CONTRAST MRA OF THE NECK WITHOUT AND WITH CONTRAST TECHNIQUE: Multiplanar, multiecho pulse sequences of the brain, circle of willis and surrounding structures were obtained without intravenous contrast. Angiographic images of the neck were obtained using MRA technique without and with intravenous contrast. CONTRAST:  49mL MULTIHANCE GADOBENATE DIMEGLUMINE 529 MG/ML IV SOLN COMPARISON:  Head CT 11/10/2017 FINDINGS: MR HEAD FINDINGS Brain: There is no evidence of acute infarct, intracranial hemorrhage, mass, midline shift, or extra-axial fluid collection. The ventricles and sulci are within normal limits for age. Scattered small foci of T2 hyperintensity in the subcortical deep cerebral white matter bilaterally are nonspecific but compatible with mild chronic small vessel ischemic disease. Small chronic infarcts are noted in the right centrum semiovale and left cerebellum. Vascular: Major intracranial vascular flow voids are preserved. Skull and upper cervical spine: Unremarkable bone marrow signal. Sinuses/Orbits: Left larger than right maxillary sinus mucous  retention cysts. Clear mastoid air cells. Unremarkable orbits. Other: None. MR CIRCLE OF WILLIS FINDINGS The visualized distal left vertebral artery is widely patent and dominant and supplies the basilar artery. The right vertebral artery occludes just beyond the PICA origin. Patent PICA, AICA, and SCA origins are visualized bilaterally. The basilar artery is widely patent. There is a small left posterior communicating artery. The PCAs are patent without evidence of significant stenosis. The internal carotid arteries are widely patent from skullbase to carotid termini. The cavernous segments are slightly ectatic. The ACAs and MCAs are patent without evidence of proximal branch occlusion or significant stenosis. No aneurysm is identified. MRA NECK FINDINGS Motion artifact through the upper chest limits assessment of the aortic arch including the arch vessel origins. The brachiocephalic and subclavian arteries are grossly patent. The cervical carotid arteries are patent with limited assessment of the proximal common carotid arteries due to motion, particularly  on the left. Otherwise, the common and cervical internal carotid arteries are widely patent without evidence of stenosis or dissection. The distal cervical ICAs are tortuous, particularly on the left. The vertebral arteries are patent in the neck with antegrade flow bilaterally. The proximal V1 segments are not well evaluated due to motion, particularly on the right. The left vertebral artery is otherwise widely patent and strongly dominant. There are mild to moderate stenoses of the distal V3 and proximal V4 segments of the right vertebral artery, and the right vertebral artery is occluded with intermittent reconstitution distal to the PICA origin. IMPRESSION: 1. No acute intracranial abnormality. 2. Mild chronic small vessel ischemic disease. 3. Chronic left cerebellar infarct. 4. 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 5. Widely  patent and strongly dominant left vertebral artery. 6. No evidence of significant cervical carotid artery or intracranial anterior circulation stenosis. Motion artifact limits assessment of the aortic arch and proximal arch vessels. Electronically Signed   By: Logan Bores M.D.   On: 11/11/2017 09:42   Dg Chest Portable 1 View  Result Date: 11/10/2017 CLINICAL DATA:  76 year old male was pushing signal on a tractor all day yesterday and has now felt faint with generalized weakness for the past 3 hours. EXAM: PORTABLE CHEST 1 VIEW COMPARISON:  11/13/2016 FINDINGS: Top-normal size heart with aortic atherosclerosis. Status post CABG with left atrial appendage clip. Clear lungs with minimal atelectasis at the lung bases. No pneumonic consolidation or CHF. No significant effusions. No acute osseous appearing abnormality. The lateral costophrenic angles are excluded bilaterally. IMPRESSION: Borderline cardiomegaly with post CABG change. No active pulmonary disease. Electronically Signed   By: Ashley Royalty M.D.   On: 11/10/2017 18:21        Scheduled Meds: . aspirin EC  81 mg Oral Daily  . cholecalciferol  1,000 Units Oral Daily  . enoxaparin (LOVENOX) injection  40 mg Subcutaneous Q24H  . gabapentin  200 mg Oral TID  . insulin aspart  0-9 Units Subcutaneous TID WC  . loratadine  10 mg Oral Daily  . [START ON 11/12/2017] metoprolol succinate  25 mg Oral Q breakfast  . montelukast  10 mg Oral QHS  . multivitamin with minerals  1 tablet Oral Daily  . pantoprazole  40 mg Oral Daily  . vitamin B-12  500 mcg Oral Daily  . vitamin C  1,000 mg Oral Daily   Continuous Infusions: . sodium chloride 125 mL/hr (11/11/17 1456)     LOS: 0 days    Time spent: 35 min    Geradine Girt, DO [facility] Pager [phone]  If 7PM-7AM, please contact night-coverage [email or web address] Password [facility] 11/11/2017, 4:35 PM

## 2017-11-11 NOTE — Evaluation (Signed)
SLP Cancellation Note  Patient Details Name: [name] MRN: [id number] DOB: 08/21/1941   Cancelled treatment:       Reason Eval/Treat Not Completed: Patient at procedure or test/unavailable(pt at MRI)   Macario Golds 11/11/2017, 8:32 AM   Luanna Salk, [place] [facility] SLP [phone]

## 2017-11-11 NOTE — Progress Notes (Signed)
Pt bed alarm observed to be activated and going off, and pt's son in the room silenced the alarm, was however advised not to do so as it was a safety issue and it will be difficult to know the room the alarm was coming from, pt was assisted to the bathroom and back to bed, call light at bedside, pt's RN Wende Crease was also made aware. Obasogie-Asidi, Dyon Rotert Efe

## 2017-11-11 NOTE — Progress Notes (Addendum)
Subjective: Patient no longer feeling dizzy or like he is going to faint.  Has not received any Antivert since been in the hospital.  He has received 3 L of saline in the form of 3 1 L boluses.  Exam: Vitals:   11/11/17 0000 11/11/17 0500  BP: 104/63 112/67  Pulse: 69 65  Resp: 16 18  Temp: 97.8 F (36.6 C) 98.3 F (36.8 C)  SpO2: 95% 93%    HEENT-  Normocephalic, no lesions, without obvious abnormality.  Normal external eye and conjunctiva.  Normal TM's bilaterally.  Normal auditory canals and external ears. Normal external nose, mucus membranes and septum.  Normal pharynx. Cardiovascular- S1, S2 normal, pulses palpable throughout   Lungs- chest clear, no wheezing, rales, normal symmetric air entry Abdomen- normal findings: bowel sounds normal Extremities- no edema    Neuro:  CN: Pupils are equal and round. They are symmetrically reactive from 3-->2 mm. EOMI without nystagmus--but did note saccadic eye movement in both directions. Facial sensation is intact to light touch. Face is symmetric at rest with normal strength and mobility. Hearing is intact to conversational voice. Palate elevates symmetrically and uvula is midline. Voice is normal in tone, pitch and quality. Bilateral SCM and trapezii are 5/5. Tongue is midline with normal bulk and mobility.  Motor: Normal bulk, tone, and strength. 5/5 throughout.   Sensation: Intact to light touch.  DTRs: 2+, symmetric  Toes downgoing bilaterally. No pathologic reflexes.  Coordination: Finger-to-nose and heel-to-shin are without dysmetria--and point tremor with upper extremities finger to nose  Medications:  Scheduled: . aspirin EC  81 mg Oral Daily  . cholecalciferol  1,000 Units Oral Daily  . enoxaparin (LOVENOX) injection  40 mg Subcutaneous Q24H  . gabapentin  200 mg Oral TID  . insulin aspart  0-9 Units Subcutaneous TID WC  . loratadine  10 mg Oral Daily  . metoprolol succinate  25 mg Oral Q breakfast  . montelukast  10 mg  Oral QHS  . multivitamin with minerals  1 tablet Oral Daily  . pantoprazole  40 mg Oral Daily  . vitamin B-12  500 mcg Oral Daily  . vitamin C  1,000 mg Oral Daily    Pertinent Labs/Diagnostics:   Ct Head Wo Contrast  Result Date: 11/10/2017 CLINICAL DATA:  Generalized weakness. EXAM: CT HEAD WITHOUT CONTRAST TECHNIQUE: Contiguous axial images were obtained from the base of the skull through the vertex without intravenous contrast. COMPARISON:  CT scan of August 21, 2013. FINDINGS: Brain: Mild diffuse cortical atrophy is noted. Old left cerebellar infarction is noted. Mild chronic ischemic white matter disease is noted. No mass effect or midline shift is noted. Ventricular size is within normal limits. There is no evidence of mass lesion, hemorrhage or acute infarction. Vascular: No hyperdense vessel or unexpected calcification. Skull: Normal. Negative for fracture or focal lesion. Sinuses/Orbits: Mucous retention cysts are noted in both maxillary sinuses. Other: None. IMPRESSION: Mild diffuse cortical atrophy. Mild chronic ischemic white matter disease. No acute intracranial abnormality seen. Electronically Signed   By: Marijo Conception, M.D.   On: 11/10/2017 19:56   Mr Angiogram Head Wo Contrast   IMPRESSION: 1. No acute intracranial abnormality. 2. Mild chronic small vessel ischemic disease. 3. Chronic left cerebellar infarct. 4. 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 5. Widely patent and strongly dominant left vertebral artery. 6. No evidence of significant cervical carotid artery or intracranial anterior circulation stenosis. Motion artifact limits assessment of the aortic arch  and proximal arch vessels. Electronically Signed   By: Logan Bores M.D.   On: 11/11/2017 09:42   Mr Angiogram Neck W Or Wo Contrast  . IMPRESSION: 1. No acute intracranial abnormality. 2. Mild chronic small vessel ischemic disease. 3. Chronic left cerebellar infarct. 4. Occlusion of  the non-dominant right vertebral artery distal to PICA. 5. Widely patent and strongly dominant left vertebral artery. 6. No evidence of significant cervical carotid artery or intracranial anterior circulation stenosis. Motion artifact limits assessment of the aortic arch and proximal arch vessels. Electronically Signed   By: Logan Bores M.D.   On: 11/11/2017 09:42   Mr Brain Wo Contrast MR HEAD FINDINGS Brain:  IMPRESSION: 1. No acute intracranial abnormality. 2. Mild chronic small vessel ischemic disease. 3. Chronic left cerebellar infarct. 4. 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 5. Widely patent and strongly dominant left vertebral artery. 6. No evidence of significant cervical carotid artery or intracranial anterior circulation stenosis. Motion artifact limits assessment of the aortic arch and proximal arch vessels. Electronically Signed   By: Logan Bores M.D.   On: 11/11/2017 09:42   Dg Chest Portable 1 View  Result Date: 11/10/2017 CLINICAL DATA:  76 year old male was pushing signal on a tractor all day yesterday and has now felt faint with generalized weakness for the past 3 hours. EXAM: PORTABLE CHEST 1 VIEW COMPARISON:  11/13/2016 FINDINGS: Top-normal size heart with aortic atherosclerosis. Status post CABG with left atrial appendage clip. Clear lungs with minimal atelectasis at the lung bases. No pneumonic consolidation or CHF. No significant effusions. No acute osseous appearing abnormality. The lateral costophrenic angles are excluded bilaterally. IMPRESSION: Borderline cardiomegaly with post CABG change. No active pulmonary disease. Electronically Signed   By: Ashley Royalty M.D.   On: 11/10/2017 18:21     Etta Quill PA-C [facility] [phone]  Impression:  76 year old male with generalized malaise, dizziness which is improved with IV hydration.  With negative MRI and improvement, I think further workup is unlikely to be enlightening.  I  would favor avoiding dehydration in the future.  As to the difficulty speaking, it is not uncommon with dehydration to get some mild confusion in the elderly.  Recommendations: Continue supportive therapy, no further recognitions at this time, please call with further questions or concerns.  Roland Rack, MD [facility] [phone]  If 7pm- 7am, please page neurology on call as listed in [place].   11/11/2017, 10:04 AM

## 2017-11-11 NOTE — Evaluation (Signed)
Occupational Therapy Evaluation and Discharge Patient Details Name: [name] MRN: [id number] DOB: October 12, 1941 Today's Date: 11/11/2017    History of Present Illness Patient is a 76 y/o male who presents with weakness, lightheadedness, N/V and difficulty speaking. Found to be hypothermic. Head CT-negative. 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   Clinical Impression   Pt reports he was independent with ADL PTA. Currently pt overall supervision for ADL and functional mobility. Pt impulsive and demonstrating decreased awareness during functional tasks with mild balance deficits evident. Educated pt and family on home safety and fall prevention. Pt planning to d/c home with supervision from family. No further acute OT needs identified; signing off at this time. Please re-consult if needs change. Thank you for this referral.    Follow Up Recommendations  No OT follow up;Supervision - Intermittent    Equipment Recommendations  None recommended by OT    Recommendations for Other Services       Precautions / Restrictions Precautions Precautions: Fall Restrictions Weight Bearing Restrictions: No      Mobility Bed Mobility      General bed mobility comments: Pt sitting EOB upon arrival  Transfers Overall transfer level: Needs assistance Equipment used: None Transfers: Sit to/from Stand Sit to Stand: Supervision         General transfer comment: for safety, no dizziness or unsteadiness    Balance Overall balance assessment: Needs assistance Sitting-balance support: Feet supported;No upper extremity supported Sitting balance-Leahy Scale: Normal    Standing balance support: No upper extremity supported;During functional activity Standing balance-Leahy Scale: Good                             ADL either performed or assessed with clinical judgement   ADL Overall ADL's : Needs assistance/impaired Eating/Feeding: Set up;Sitting   Grooming: Supervision/safety;Standing   Upper Body Bathing: Set up;Sitting   Lower Body Bathing: Supervison/ safety;Sit to/from stand   Upper Body Dressing : Set up;Sitting   Lower Body Dressing: Supervision/safety;Sit to/from stand   Toilet Transfer: Supervision/safety;Ambulation;Regular Toilet           Functional mobility during ADLs: Supervision/safety General ADL Comments: Discussed home safety and fall prevention.     Vision Baseline Vision/History: Wears glasses Wears Glasses: Reading only Patient Visual Report: No change from baseline Vision Assessment?: No apparent visual deficits     Perception     Praxis      Pertinent Vitals/Pain Pain Assessment: No/denies pain     Hand Dominance Right   Extremity/Trunk Assessment Upper Extremity Assessment Upper Extremity Assessment: Overall WFL for tasks assessed   Lower Extremity Assessment Lower Extremity Assessment: Defer to PT evaluation   Cervical / Trunk Assessment Cervical / Trunk Assessment: Normal   Communication Communication Communication: Expressive difficulties   Cognition Arousal/Alertness: Awake/alert Behavior During Therapy: Impulsive Overall Cognitive Status: Impaired/Different from baseline Area of Impairment: Safety/judgement;Awareness                         Safety/Judgement: Decreased awareness of safety Awareness: Emergent   General Comments: Difficulty with word finding and fluency when answering questions. Despite knowing he is unsteady,    General Comments  son present during session.    Exercises     Shoulder Instructions  Home Living Family/patient expects to be discharged to:: Private residence Living Arrangements: Spouse/significant other;Children;Other relatives Available Help at Discharge: Family;Available 24  hours/day Type of Home: House Home Access: Stairs to enter [employer] of Steps: 4 Entrance Stairs-Rails: Right Home Layout: Multi-level Alternate Level Stairs-Number of Steps: 1 flight to get to bedroom Alternate Level Stairs-Rails: Right Bathroom Shower/Tub: Occupational psychologist: Handicapped height     Home Equipment: Shower seat - built in          Prior Functioning/Environment Level of Independence: Independent        Comments: 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 Independent, Drives.         OT Problem List:        OT Treatment/Interventions:      OT Goals(Current goals can be found in the care plan section) Acute Rehab OT Goals Patient Stated Goal: to get back home  OT Goal Formulation: All assessment and education complete, DC therapy  OT Frequency:     Barriers to D/C:            Co-evaluation              AM-PAC PT "6 Clicks" Daily Activity     Outcome Measure Help from another person eating meals?: None Help from another person taking care of personal grooming?: A Little Help from another person toileting, which includes using toliet, bedpan, or urinal?: A Little Help from another person bathing (including washing, rinsing, drying)?: A Little Help from another person to put on and taking off regular upper body clothing?: None Help from another person to put on and taking off regular lower body clothing?: A Little 6 Click Score: 20   End of Session Nurse Communication: Mobility status  Activity Tolerance: Patient tolerated treatment well Patient left: with call bell/phone within reach;with family/visitor present;Other (comment)(sitting EOB)  OT Visit Diagnosis: Unsteadiness on feet (R26.81)                Time: 7893-8101 OT Time Calculation (min): 11 min Charges:  OT General Charges $OT Visit: 1 Visit OT Evaluation $OT Eval Low Complexity: 1 Low G-Codes: OT G-codes **NOT  FOR INPATIENT CLASS** Functional Assessment Tool Used: Clinical judgement Functional Limitation: Self care Self Care Current Status (B5102): At least 1 percent but less than 20 percent impaired, limited or restricted Self Care Goal Status (H8527): At least 1 percent but less than 20 percent impaired, limited or restricted Self Care Discharge Status [phone]): At least 1 percent but less than 20 percent impaired, limited or restricted   Mel Almond A. Ulice Brilliant, M.S., OTR/L Pager: [place] 11/11/2017, 2:22 PM

## 2017-11-11 NOTE — Progress Notes (Signed)
Patient 76 years old male came in through Urgent care there [facility] with generalized weakness and nausea . Aler and oriented x 4 MAE with ,denied H/a, pain, nausea and vomiting nor SOB upon arrival on the.  Oriented to unit and use of call light. Cardiac monitor applied SR on the monitor.  No distress at present. Will continue to monitor . Family at bed side

## 2017-11-11 NOTE — ED Notes (Signed)
Unable to obtain temperature on patient, skin cool to touch. Patient alert and oriented, charge RN made aware of need for treatment room.

## 2017-11-11 NOTE — Evaluation (Signed)
Physical Therapy Evaluation Patient Details Name: [name] MRN: [id number] DOB: 1940-12-08 Today's Date: 11/11/2017   History of Present Illness  Patient is a 76 y/o male who presents with weakness, lightheadedness, N/V and difficulty speaking. Found to be hypothermic. Head CT-negative. 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  Clinical Impression  Patient presents with expressive speech deficits, impulsivity and impaired balance s/p above. Pt very fast to perform all mobility which son reports as baseline. Tolerated gait training and stair training with close [place] guard for safety due to instability/safety. Pt independent PTA and does lots of physical work outside. Pt lives with wife and daughter but they are not able to provide any physical assist. Education re: safety with mobility, slowing down etc. Will follow acutely to maximize independence and mobility prior to return home. Will need higher level balance so he can return to PLOF.    Follow Up Recommendations Outpatient PT    Equipment Recommendations  None recommended by PT    Recommendations for Other Services       Precautions / Restrictions Precautions Precautions: Fall Restrictions Weight Bearing Restrictions: No      Mobility  Bed Mobility Overal bed mobility: Needs Assistance Bed Mobility: Supine to Sit     Supine to sit: Modified independent (Device/Increase time)     General bed mobility comments: Quick to get to EOB without assist, use of rail. Impulsive.  Transfers Overall transfer level: Needs assistance Equipment used: None Transfers: Sit to/from Stand Sit to Stand: Supervision         General transfer comment: Supervision for safety due to impulsive movement. No dizziness.   Ambulation/Gait Ambulation/Gait assistance: Min  guard Ambulation Distance (Feet): 150 Feet Assistive device: None Gait Pattern/deviations: Step-through pattern;Decreased stride length;Wide base of support;Staggering right;Staggering left;Drifts right/left Gait velocity: fast paced   General Gait Details: Fast paced gait with staggering to right/left and drifting noted esp with multi tasking and head turns. No overt LOb as pt able to self correct for instability. 2/4 DOE.  Stairs Stairs: Yes Stairs assistance: Min guard Stair Management: Two rails;Alternating pattern Number of Stairs: 3(+ 2 steps x2 bouts) General stair comments: Cues to slow down for safety.   Wheelchair Mobility    Modified Rankin (Stroke Patients Only) Modified Rankin (Stroke Patients Only) Pre-Morbid Rankin Score: No symptoms Modified Rankin: Moderate disability     Balance Overall balance assessment: Needs assistance Sitting-balance support: Feet supported;No upper extremity supported Sitting balance-Leahy Scale: Good Sitting balance - Comments: Able to adjust sock sitting EOB reaching outside BoS.    Standing balance support: During functional activity Standing balance-Leahy Scale: Good                               Pertinent Vitals/Pain Pain Assessment: No/denies pain    Home Living Family/patient expects to be discharged to:: Private residence Living Arrangements: Spouse/significant other;Children;Other relatives Available Help at Discharge: Family;Available 24 hours/day Type of Home: House Home Access: Stairs to enter Entrance Stairs-Rails: Right Entrance Stairs-Number of Steps: 4 Home Layout: Multi-level Home Equipment: None      Prior Function Level of Independence: Independent         Comments: 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 Independent, Drives.  Hand Dominance        Extremity/Trunk Assessment   Upper Extremity Assessment Upper Extremity Assessment: Defer  to OT evaluation    Lower Extremity Assessment Lower Extremity Assessment: Overall WFL for tasks assessed(5/5 throughout; sensation WFL BLEs.)       Communication   Communication: Expressive difficulties  Cognition Arousal/Alertness: Awake/alert Behavior During Therapy: Impulsive Overall Cognitive Status: Impaired/Different from baseline Area of Impairment: Safety/judgement;Awareness                         Safety/Judgement: Decreased awareness of safety Awareness: Emergent   General Comments: Difficulty with word finding and fluency when answering questions. Despite knowing he is unsteady,       General Comments General comments (skin integrity, edema, etc.): son present during session.    Exercises     Assessment/Plan    PT Assessment Patient needs continued PT services  PT Problem List Decreased mobility;Decreased safety awareness;Cardiopulmonary status limiting activity;Decreased balance       PT Treatment Interventions Functional mobility training;Balance training;Patient/family education;Gait training;Therapeutic activities;Neuromuscular re-education;Stair training;Therapeutic exercise;Cognitive remediation    PT Goals (Current goals can be found in the Care Plan section)  Acute Rehab PT Goals Patient Stated Goal: to get back home  PT Goal Formulation: With patient Time For Goal Achievement: 11/25/17 Potential to Achieve Goals: Good    Frequency Min 4X/week   Barriers to discharge Inaccessible home environment;Decreased caregiver support wife cannot physically assist; stairs to enter home and to get to bedroom    Co-evaluation               AM-PAC PT "6 Clicks" Daily Activity  Outcome Measure Difficulty turning over in bed (including adjusting bedclothes, sheets and blankets)?: None Difficulty moving from lying on back to sitting on the side of the bed? : None Difficulty sitting down on and standing up from a chair with arms (e.g.,  wheelchair, bedside commode, etc,.)?: None Help needed moving to and from a bed to chair (including a wheelchair)?: A Little Help needed walking in hospital room?: A Little Help needed climbing 3-5 steps with a railing? : A Little 6 Click Score: 21    End of Session Equipment Utilized During Treatment: Gait belt Activity Tolerance: Patient tolerated treatment well Patient left: in bed;with call bell/phone within reach;with family/visitor present;with bed alarm set(sitting EOB.) Nurse Communication: Mobility status PT Visit Diagnosis: Unsteadiness on feet (R26.81);Difficulty in walking, not elsewhere classified (R26.2)    Time: 5364-6803 PT Time Calculation (min) (ACUTE ONLY): 33 min   Charges:   PT Evaluation $PT Eval Low Complexity: 1 Low PT Treatments $Gait Training: 8-22 mins   PT G Codes:   PT G-Codes **NOT FOR INPATIENT CLASS** Functional Assessment Tool Used: Clinical judgement Functional Limitation: Mobility: Walking and moving around Mobility: Walking and Moving Around Current Status (O1224): At least 1 percent but less than 20 percent impaired, limited or restricted Mobility: Walking and Moving Around Goal Status [phone]): At least 1 percent but less than 20 percent impaired, limited or restricted    [place], [place], [place] 11/11/2017, 11:07 AM

## 2017-11-12 DIAGNOSIS — Z8679 Personal history of other diseases of the circulatory system: Secondary | ICD-10-CM | POA: Diagnosis not present

## 2017-11-12 DIAGNOSIS — Z9889 Other specified postprocedural states: Secondary | ICD-10-CM | POA: Diagnosis not present

## 2017-11-12 DIAGNOSIS — I4891 Unspecified atrial fibrillation: Secondary | ICD-10-CM | POA: Diagnosis not present

## 2017-11-12 DIAGNOSIS — T68XXXA Hypothermia, initial encounter: Secondary | ICD-10-CM

## 2017-11-12 DIAGNOSIS — R55 Syncope and collapse: Secondary | ICD-10-CM

## 2017-11-12 DIAGNOSIS — K219 Gastro-esophageal reflux disease without esophagitis: Secondary | ICD-10-CM

## 2017-11-12 DIAGNOSIS — N182 Chronic kidney disease, stage 2 (mild): Secondary | ICD-10-CM | POA: Diagnosis not present

## 2017-11-12 DIAGNOSIS — E1129 Type 2 diabetes mellitus with other diabetic kidney complication: Secondary | ICD-10-CM

## 2017-11-12 DIAGNOSIS — R531 Weakness: Secondary | ICD-10-CM | POA: Diagnosis not present

## 2017-11-12 DIAGNOSIS — R479 Unspecified speech disturbances: Secondary | ICD-10-CM

## 2017-11-12 DIAGNOSIS — I2510 Atherosclerotic heart disease of native coronary artery without angina pectoris: Secondary | ICD-10-CM

## 2017-11-12 DIAGNOSIS — R809 Proteinuria, unspecified: Secondary | ICD-10-CM

## 2017-11-12 DIAGNOSIS — Z951 Presence of aortocoronary bypass graft: Secondary | ICD-10-CM | POA: Diagnosis not present

## 2017-11-12 LAB — URINE CULTURE: Culture: NO GROWTH

## 2017-11-12 LAB — GLUCOSE, CAPILLARY: Glucose-Capillary: 130 mg/dL — ABNORMAL HIGH (ref 65–99)

## 2017-11-12 NOTE — Discharge Summary (Signed)
Physician Discharge Summary  [name] PQD:[id number] DOB: 11-Dec-1940 DOA: 11/10/2017  PCP: Lorene Dy, MD  Admit date: 11/10/2017 Discharge date: 11/12/2017  Admitted From: home  Disposition:  home   Recommendations for Outpatient Follow-up:  1. F/u with cardiology for further work up  Discharge Condition:  stable   CODE STATUS:  Full code   Consultations:  Neurology     Discharge Diagnoses:  Principal Problem:   Near syncope   Difficulty speaking   Hypothermia Active Problems:   Atrial fibrillation (HCC)   Coronary atherosclerosis of native coronary artery   Type II diabetes mellitus with renal manifestations (HCC)   Hyperlipidemia   Hypertension   S/P CABG x 2   S/P Maze operation for atrial fibrillation   Chronic kidney disease (CKD), stage II (mild)   GERD (gastroesophageal reflux disease)     Subjective: The patient has no complatins today. His symptoms have resolved and he is anxious to go home. Dicussed plan in presence of patient and son and all questions answered.   Brief Summary: [name] is a 76 y.o. male who has a past medical history of atrial fibrillation not on anticoagulation, hypertension, coronary artery disease, status post CABG, type 2 diabetes, obesity, sleep apnea who presents with generalized weakness and lightheadedness for the last 3 hours. He felt as if he would pass out and broke out into a cold sweat and became nauseated. This was followed by 1 episode of vomiting while checking into UC. He was also noted to be hypothermic. He had been on a tractor all day the day before, pushing snow. There was some issue of whether his speech was a little slurred as well.   Neuro eval obtained in ER and further work up done.  Temp in ER 95.4. Lactic acid was 2.64 and he was started on antibiotics for possible infection.      Hospital Course:  Lightheaded, nausea/ vomiting, near syncope  - ? Orthostatic/ dehydrated- BUN/ Cr was  elevated and U sp gravity was on the high side - after IVF, his symptoms have resolved - Neuro eval included     - CT of the head: Mild diffuse cortical atrophy    -  an MRI/ MRA- pertinent findings: Chronic left cerebellar infarct.. 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 - ? If he had recurrence of A-fib or other arrhythmia: Tele monitoring shows persistent Trigeminy yesterday and again this AM- I have spoken with Dr Burt Knack about this (he made a social visit yesterday) and he will plan an outpt holter monitor and ECHO for him- discussed with patient and son that he will need to f/u with cardiology   Hypothermia/ mild Lactic acidosis - unclear cause- was in the snow most of the day on the day before - no signs of infection noted on exam- UA & CXR negative - Pro calcitonin was ordered in ER and negative as well - resolve quickly with heating blanket and IVF   PAF:   - not on AC per cardiology as A-fib apparently has not recurred after MAZE procedure - cnot ASA and Metoprolol - see above about ventricular trigeminy  Coronary atherosclerosis of native coronary artery: s/p of stent and CABG.   HTN: - cont home meds  CKD 2     Discharge Instructions  Discharge Instructions    Diet - low sodium heart healthy   Complete by:  As directed  Diet Carb Modified   Complete by:  As directed    Increase activity slowly   Complete by:  As directed      Allergies as of 11/12/2017      Reactions   Asa [aspirin] Other (See Comments)   "Only can tolerate low dose" (specific reaction not recalled) pt itches with ASA greater than 81 mg   Statins Other (See Comments)   Statins cause muscle aches      Medication List    TAKE these medications   aspirin 81 MG EC tablet Take 1 tablet (81 mg total) by mouth daily.   Azelastine-Fluticasone 137-50 MCG/ACT Susp Commonly known as:  DYMISTA Place 1 spray into the nose 2 (two) times daily.   B-12 500  MCG Tabs Take 500 mcg by mouth daily.   cetirizine-pseudoephedrine 5-120 MG tablet Commonly known as:  ZYRTEC-D Take 1 tablet by mouth 2 (two) times daily.   CINNAMON PO Take 2 capsules by mouth daily.   famotidine 20 MG tablet Commonly known as:  PEPCID One at bedtime   gabapentin 100 MG capsule Commonly known as:  NEURONTIN Take 2 capsules (200 mg total) by mouth 3 (three) times daily. What changed:  when to take this   GLUCOSAMINE 1500 COMPLEX PO Take 1 tablet by mouth daily.   glucose blood test strip Commonly known as:  CHOICE DM FORA G20 TEST STRIPS Use as instructed   glucose monitoring kit monitoring kit 1 each by Does not apply route as needed for other.   losartan 25 MG tablet Commonly known as:  COZAAR Take 1 tablet (25 mg total) by mouth daily.   metFORMIN 500 MG tablet Commonly known as:  GLUCOPHAGE Take 500 mg by mouth 2 (two) times daily with a meal.   metoprolol succinate 25 MG 24 hr tablet Commonly known as:  TOPROL-XL Take 25 mg by mouth daily.   montelukast 10 MG tablet Commonly known as:  SINGULAIR Take 1 tablet (10 mg total) by mouth at bedtime.   multivitamin with minerals tablet Take 1 tablet by mouth daily.   olopatadine 0.1 % ophthalmic solution Commonly known as:  PATANOL Place 1 drop into both eyes 2 (two) times daily. What changed:    when to take this  reasons to take this   onetouch ultrasoft lancets Use as instructed   pantoprazole 40 MG tablet Commonly known as:  PROTONIX Take 1 tablet (40 mg total) by mouth daily. Take 30-60 min before first meal of the day   pioglitazone 45 MG tablet Commonly known as:  ACTOS Take 45 mg by mouth daily.   triamcinolone cream 0.1 % Commonly known as:  KENALOG Apply 1 application topically 2 (two) times daily as needed (for irritation).   vitamin C 1000 MG tablet Take 1,000 mg by mouth daily.   Vitamin D-3 1000 units Caps Take 1,000 Units by mouth daily.       Allergies   Allergen Reactions  . Asa [Aspirin] Other (See Comments)    "Only can tolerate low dose" (specific reaction not recalled) pt itches with ASA greater than 81 mg  . Statins Other (See Comments)    Statins cause muscle aches     Procedures/Studies:    Ct Head Wo Contrast  Result Date: 11/10/2017 CLINICAL DATA:  Generalized weakness. EXAM: CT HEAD WITHOUT CONTRAST TECHNIQUE: Contiguous axial images were obtained from the base of the skull through the vertex without intravenous contrast. COMPARISON:  CT scan of August 21, 2013.  FINDINGS: Brain: Mild diffuse cortical atrophy is noted. Old left cerebellar infarction is noted. Mild chronic ischemic white matter disease is noted. No mass effect or midline shift is noted. Ventricular size is within normal limits. There is no evidence of mass lesion, hemorrhage or acute infarction. Vascular: No hyperdense vessel or unexpected calcification. Skull: Normal. Negative for fracture or focal lesion. Sinuses/Orbits: Mucous retention cysts are noted in both maxillary sinuses. Other: None. IMPRESSION: Mild diffuse cortical atrophy. Mild chronic ischemic white matter disease. No acute intracranial abnormality seen. Electronically Signed   By: Marijo Conception, M.D.   On: 11/10/2017 19:56   Mr Angiogram Head Wo Contrast  Result Date: 11/11/2017 CLINICAL DATA:  Nystagmus. Dysarthria. Generalized weakness, nausea, dizziness, and disequilibrium. History of atrial fibrillation not on anticoagulation. EXAM: MR HEAD WITHOUT CONTRAST MR CIRCLE OF WILLIS WITHOUT CONTRAST MRA OF THE NECK WITHOUT AND WITH CONTRAST TECHNIQUE: Multiplanar, multiecho pulse sequences of the brain, circle of willis and surrounding structures were obtained without intravenous contrast. Angiographic images of the neck were obtained using MRA technique without and with intravenous contrast. CONTRAST:  6m MULTIHANCE GADOBENATE DIMEGLUMINE 529 MG/ML IV SOLN COMPARISON:  Head CT 11/10/2017 FINDINGS:  MR HEAD FINDINGS Brain: There is no evidence of acute infarct, intracranial hemorrhage, mass, midline shift, or extra-axial fluid collection. The ventricles and sulci are within normal limits for age. Scattered small foci of T2 hyperintensity in the subcortical deep cerebral white matter bilaterally are nonspecific but compatible with mild chronic small vessel ischemic disease. Small chronic infarcts are noted in the right centrum semiovale and left cerebellum. Vascular: Major intracranial vascular flow voids are preserved. Skull and upper cervical spine: Unremarkable bone marrow signal. Sinuses/Orbits: Left larger than right maxillary sinus mucous retention cysts. Clear mastoid air cells. Unremarkable orbits. Other: None. MR CIRCLE OF WILLIS FINDINGS The visualized distal left vertebral artery is widely patent and dominant and supplies the basilar artery. The right vertebral artery occludes just beyond the PICA origin. Patent PICA, AICA, and SCA origins are visualized bilaterally. The basilar artery is widely patent. There is a small left posterior communicating artery. The PCAs are patent without evidence of significant stenosis. The internal carotid arteries are widely patent from skullbase to carotid termini. The cavernous segments are slightly ectatic. The ACAs and MCAs are patent without evidence of proximal branch occlusion or significant stenosis. No aneurysm is identified. MRA NECK FINDINGS Motion artifact through the upper chest limits assessment of the aortic arch including the arch vessel origins. The brachiocephalic and subclavian arteries are grossly patent. The cervical carotid arteries are patent with limited assessment of the proximal common carotid arteries due to motion, particularly on the left. Otherwise, the common and cervical internal carotid arteries are widely patent without evidence of stenosis or dissection. The distal cervical ICAs are tortuous, particularly on the left. The vertebral  arteries are patent in the neck with antegrade flow bilaterally. The proximal V1 segments are not well evaluated due to motion, particularly on the right. The left vertebral artery is otherwise widely patent and strongly dominant. There are mild to moderate stenoses of the distal V3 and proximal V4 segments of the right vertebral artery, and the right vertebral artery is occluded with intermittent reconstitution distal to the PICA origin. IMPRESSION: 1. No acute intracranial abnormality. 2. Mild chronic small vessel ischemic disease. 3. Chronic left cerebellar infarct. 4. 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 5. Widely patent and strongly dominant left vertebral artery. 6. No evidence of significant  cervical carotid artery or intracranial anterior circulation stenosis. Motion artifact limits assessment of the aortic arch and proximal arch vessels. Electronically Signed   By: Logan Bores M.D.   On: 11/11/2017 09:42   Mr Angiogram Neck W Or Wo Contrast  Result Date: 11/11/2017 CLINICAL DATA:  Nystagmus. Dysarthria. Generalized weakness, nausea, dizziness, and disequilibrium. History of atrial fibrillation not on anticoagulation. EXAM: MR HEAD WITHOUT CONTRAST MR CIRCLE OF WILLIS WITHOUT CONTRAST MRA OF THE NECK WITHOUT AND WITH CONTRAST TECHNIQUE: Multiplanar, multiecho pulse sequences of the brain, circle of willis and surrounding structures were obtained without intravenous contrast. Angiographic images of the neck were obtained using MRA technique without and with intravenous contrast. CONTRAST:  36m MULTIHANCE GADOBENATE DIMEGLUMINE 529 MG/ML IV SOLN COMPARISON:  Head CT 11/10/2017 FINDINGS: MR HEAD FINDINGS Brain: There is no evidence of acute infarct, intracranial hemorrhage, mass, midline shift, or extra-axial fluid collection. The ventricles and sulci are within normal limits for age. Scattered small foci of T2 hyperintensity in the subcortical deep cerebral white matter  bilaterally are nonspecific but compatible with mild chronic small vessel ischemic disease. Small chronic infarcts are noted in the right centrum semiovale and left cerebellum. Vascular: Major intracranial vascular flow voids are preserved. Skull and upper cervical spine: Unremarkable bone marrow signal. Sinuses/Orbits: Left larger than right maxillary sinus mucous retention cysts. Clear mastoid air cells. Unremarkable orbits. Other: None. MR CIRCLE OF WILLIS FINDINGS The visualized distal left vertebral artery is widely patent and dominant and supplies the basilar artery. The right vertebral artery occludes just beyond the PICA origin. Patent PICA, AICA, and SCA origins are visualized bilaterally. The basilar artery is widely patent. There is a small left posterior communicating artery. The PCAs are patent without evidence of significant stenosis. The internal carotid arteries are widely patent from skullbase to carotid termini. The cavernous segments are slightly ectatic. The ACAs and MCAs are patent without evidence of proximal branch occlusion or significant stenosis. No aneurysm is identified. MRA NECK FINDINGS Motion artifact through the upper chest limits assessment of the aortic arch including the arch vessel origins. The brachiocephalic and subclavian arteries are grossly patent. The cervical carotid arteries are patent with limited assessment of the proximal common carotid arteries due to motion, particularly on the left. Otherwise, the common and cervical internal carotid arteries are widely patent without evidence of stenosis or dissection. The distal cervical ICAs are tortuous, particularly on the left. The vertebral arteries are patent in the neck with antegrade flow bilaterally. The proximal V1 segments are not well evaluated due to motion, particularly on the right. The left vertebral artery is otherwise widely patent and strongly dominant. There are mild to moderate stenoses of the distal V3 and  proximal V4 segments of the right vertebral artery, and the right vertebral artery is occluded with intermittent reconstitution distal to the PICA origin. IMPRESSION: 1. No acute intracranial abnormality. 2. Mild chronic small vessel ischemic disease. 3. Chronic left cerebellar infarct. 4. 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 5. Widely patent and strongly dominant left vertebral artery. 6. No evidence of significant cervical carotid artery or intracranial anterior circulation stenosis. Motion artifact limits assessment of the aortic arch and proximal arch vessels. Electronically Signed   By: ALogan BoresM.D.   On: 11/11/2017 09:42   Mr Brain Wo Contrast  Result Date: 11/11/2017 CLINICAL DATA:  Nystagmus. Dysarthria. Generalized weakness, nausea, dizziness, and disequilibrium. History of atrial fibrillation not on anticoagulation. EXAM: MR HEAD WITHOUT CONTRAST MR CIRCLE  OF WILLIS WITHOUT CONTRAST MRA OF THE NECK WITHOUT AND WITH CONTRAST TECHNIQUE: Multiplanar, multiecho pulse sequences of the brain, circle of willis and surrounding structures were obtained without intravenous contrast. Angiographic images of the neck were obtained using MRA technique without and with intravenous contrast. CONTRAST:  49m MULTIHANCE GADOBENATE DIMEGLUMINE 529 MG/ML IV SOLN COMPARISON:  Head CT 11/10/2017 FINDINGS: MR HEAD FINDINGS Brain: There is no evidence of acute infarct, intracranial hemorrhage, mass, midline shift, or extra-axial fluid collection. The ventricles and sulci are within normal limits for age. Scattered small foci of T2 hyperintensity in the subcortical deep cerebral white matter bilaterally are nonspecific but compatible with mild chronic small vessel ischemic disease. Small chronic infarcts are noted in the right centrum semiovale and left cerebellum. Vascular: Major intracranial vascular flow voids are preserved. Skull and upper cervical spine: Unremarkable bone marrow  signal. Sinuses/Orbits: Left larger than right maxillary sinus mucous retention cysts. Clear mastoid air cells. Unremarkable orbits. Other: None. MR CIRCLE OF WILLIS FINDINGS The visualized distal left vertebral artery is widely patent and dominant and supplies the basilar artery. The right vertebral artery occludes just beyond the PICA origin. Patent PICA, AICA, and SCA origins are visualized bilaterally. The basilar artery is widely patent. There is a small left posterior communicating artery. The PCAs are patent without evidence of significant stenosis. The internal carotid arteries are widely patent from skullbase to carotid termini. The cavernous segments are slightly ectatic. The ACAs and MCAs are patent without evidence of proximal branch occlusion or significant stenosis. No aneurysm is identified. MRA NECK FINDINGS Motion artifact through the upper chest limits assessment of the aortic arch including the arch vessel origins. The brachiocephalic and subclavian arteries are grossly patent. The cervical carotid arteries are patent with limited assessment of the proximal common carotid arteries due to motion, particularly on the left. Otherwise, the common and cervical internal carotid arteries are widely patent without evidence of stenosis or dissection. The distal cervical ICAs are tortuous, particularly on the left. The vertebral arteries are patent in the neck with antegrade flow bilaterally. The proximal V1 segments are not well evaluated due to motion, particularly on the right. The left vertebral artery is otherwise widely patent and strongly dominant. There are mild to moderate stenoses of the distal V3 and proximal V4 segments of the right vertebral artery, and the right vertebral artery is occluded with intermittent reconstitution distal to the PICA origin. IMPRESSION: 1. No acute intracranial abnormality. 2. Mild chronic small vessel ischemic disease. 3. Chronic left cerebellar infarct. 4. Occlusion  of the non-dominant right vertebral artery distal to PICA. 5. Widely patent and strongly dominant left vertebral artery. 6. No evidence of significant cervical carotid artery or intracranial anterior circulation stenosis. Motion artifact limits assessment of the aortic arch and proximal arch vessels. Electronically Signed   By: ALogan BoresM.D.   On: 11/11/2017 09:42   Dg Chest Portable 1 View  Result Date: 11/10/2017 CLINICAL DATA:  76year old male was pushing signal on a tractor all day yesterday and has now felt faint with generalized weakness for the past 3 hours. EXAM: PORTABLE CHEST 1 VIEW COMPARISON:  11/13/2016 FINDINGS: Top-normal size heart with aortic atherosclerosis. Status post CABG with left atrial appendage clip. Clear lungs with minimal atelectasis at the lung bases. No pneumonic consolidation or CHF. No significant effusions. No acute osseous appearing abnormality. The lateral costophrenic angles are excluded bilaterally. IMPRESSION: Borderline cardiomegaly with post CABG change. No active pulmonary disease. Electronically Signed   By: DShanon Brow  Randel Pigg M.D.   On: 11/10/2017 18:21       Discharge Exam: Vitals:   11/12/17 0100 11/12/17 0847  BP: 112/61 132/69  Pulse: 68 89  Resp: 16 20  Temp: 98.1 F (36.7 C) 98.9 F (37.2 C)  SpO2: 100% 95%   Vitals:   11/11/17 1756 11/11/17 2118 11/12/17 0100 11/12/17 0847  BP: 117/60 (!) 120/56 112/61 132/69  Pulse: 70 72 68 89  Resp: _0 Temp: (!) 97.3 F (36.3 C) 98.3 F (36.8 C) 98.1 F (36.7 C) 98.9 F (37.2 C)  TempSrc: Oral Oral Oral Oral  SpO2: 100% 100% 100% 95%  Weight:      Height:        General: Pt is alert, awake, not in acute distress Cardiovascular: RRR, S1/S2 +, no rubs, no gallops Respiratory: CTA bilaterally, no wheezing, no rhonchi Abdominal: Soft, NT, ND, bowel sounds + Extremities: no edema, no cyanosis    The results of significant diagnostics from this hospitalization (including imaging,  microbiology, ancillary and laboratory) are listed below for reference.     Microbiology: Recent Results (from the past 240 hour(s))  Blood Culture (routine x 2)     Status: None (Preliminary result)   Collection Time: 11/10/17  6:00 PM  Result Value Ref Range Status   Specimen Description BLOOD LEFT HAND  Final   Special Requests BLOOD AEROBIC BOTTLE Blood Culture adequate volume  Final   Culture NO GROWTH < 24 HOURS  Final   Report Status PENDING  Incomplete  Blood Culture (routine x 2)     Status: None (Preliminary result)   Collection Time: 11/10/17  6:30 PM  Result Value Ref Range Status   Specimen Description BLOOD LEFT ANTECUBITAL  Final   Special Requests   Final    BOTTLES DRAWN AEROBIC AND ANAEROBIC Blood Culture adequate volume   Culture NO GROWTH < 24 HOURS  Final   Report Status PENDING  Incomplete     Labs: BNP (last 3 results) No results for input(s): BNP in the last 8760 hours. Basic Metabolic Panel: Recent Labs  Lab 11/10/17 1327  NA 134*  K 4.0  CL 99*  CO2 22  GLUCOSE 180*  BUN 23*  CREATININE 1.33*  CALCIUM 9.0   Liver Function Tests: No results for input(s): AST, ALT, ALKPHOS, BILITOT, PROT, ALBUMIN in the last 168 hours. Recent Labs  Lab 11/10/17 2319  LIPASE 22   No results for input(s): AMMONIA in the last 168 hours. CBC: Recent Labs  Lab 11/10/17 1357  WBC 10.3  HGB 14.5  HCT 43.9  MCV 93.0  PLT 178   Cardiac Enzymes: Recent Labs  Lab 11/10/17 2319  CKTOTAL 90   BNP: Invalid input(s): POCBNP CBG: Recent Labs  Lab 11/11/17 0616 11/11/17 1111 11/11/17 1657 11/11/17 2117 11/12/17 0612  GLUCAP 125* 123* 147* 134* 130*   D-Dimer No results for input(s): DDIMER in the last 72 hours. Hgb A1c Recent Labs    11/11/17 0244  HGBA1C 6.3*   Lipid Profile Recent Labs    11/11/17 0244  CHOL 202*  HDL 44  LDLCALC 152*  TRIG 28  CHOLHDL 4.6   Thyroid function studies No results for input(s): TSH, T4TOTAL, T3FREE,  THYROIDAB in the last 72 hours.  Invalid input(s): FREET3 Anemia work up No results for input(s): VITAMINB12, FOLATE, FERRITIN, TIBC, IRON, RETICCTPCT in the last 72 hours. Urinalysis    Component Value Date/Time   COLORURINE YELLOW 11/10/2017 1325  APPEARANCEUR CLOUDY (A) 11/10/2017 1325   LABSPEC 1.027 11/10/2017 1325   PHURINE 5.0 11/10/2017 1325   GLUCOSEU NEGATIVE 11/10/2017 1325   HGBUR NEGATIVE 11/10/2017 1325   BILIRUBINUR NEGATIVE 11/10/2017 1325   KETONESUR 5 (A) 11/10/2017 1325   PROTEINUR NEGATIVE 11/10/2017 1325   UROBILINOGEN 0.2 03/04/2015 0751   NITRITE NEGATIVE 11/10/2017 1325   LEUKOCYTESUR NEGATIVE 11/10/2017 1325   Sepsis Labs Invalid input(s): PROCALCITONIN,  WBC,  LACTICIDVEN Microbiology Recent Results (from the past 240 hour(s))  Blood Culture (routine x 2)     Status: None (Preliminary result)   Collection Time: 11/10/17  6:00 PM  Result Value Ref Range Status   Specimen Description BLOOD LEFT HAND  Final   Special Requests BLOOD AEROBIC BOTTLE Blood Culture adequate volume  Final   Culture NO GROWTH < 24 HOURS  Final   Report Status PENDING  Incomplete  Blood Culture (routine x 2)     Status: None (Preliminary result)   Collection Time: 11/10/17  6:30 PM  Result Value Ref Range Status   Specimen Description BLOOD LEFT ANTECUBITAL  Final   Special Requests   Final    BOTTLES DRAWN AEROBIC AND ANAEROBIC Blood Culture adequate volume   Culture NO GROWTH < 24 HOURS  Final   Report Status PENDING  Incomplete     Time coordinating discharge: Over 30 minutes  SIGNED:   Debbe Odea, MD  [facility] 11/12/2017, 10:51 AM Pager   If 7PM-7AM, please contact night-coverage [email or web address] Password [email or web address]

## 2017-11-12 NOTE — Care Management Note (Signed)
Case Management Note  Patient Details  Name: [name] MRN: [id number] Date of Birth: 1941-02-10  Subjective/Objective:                    Action/Plan: Pt discharging home with self care. CM consulted for outpatient therapy. CM met with the patient and he is interested in attending outpatient rehab at [facility]. Orders in Epic and information on the AVS.  Son to provide transportation home.  Expected Discharge Date:  11/12/17               Expected Discharge Plan:  OP Rehab  In-House Referral:     Discharge planning Services  CM Consult  Post Acute Care Choice:    Choice offered to:     DME Arranged:    DME Agency:     HH Arranged:    HH Agency:     Status of Service:  Completed, signed off  If discussed at [employer] of Stay Meetings, dates discussed:    Additional Comments:  Pollie Friar, RN 11/12/2017, 11:24 AM

## 2017-11-12 NOTE — Care Management Obs Status (Signed)
[facility] NOTIFICATION   Patient Details  Name: [name] MRN: [id number] Date of Birth: 1941-02-16   Medicare Observation Status Notification Given:  Yes    Pollie Friar, RN 11/12/2017, 11:20 AM

## 2017-11-12 NOTE — Progress Notes (Signed)
Physical Therapy Treatment Patient Details Name: [name] MRN: [id number] DOB: 02-Feb-1941 Today's Date: 11/12/2017    History of Present Illness Patient is a 76 y/o male who presents with weakness, lightheadedness, N/V and difficulty speaking. Found to be hypothermic. Head CT-negative. 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    PT Comments    Patient progressing well towards PT goals. Continues to require cues for safety awareness and to decrease speed for all mobility. Pt impulsive and wants to get out of the hospital. Demonstrates imbalance esp with higher level challenges like walking backwards and head turns, etc. Negotiates stairs without difficulty but in a fast paced manner which is very unsafe. Education re: slowing down, safety at home etc. 2/4 DOE with mobility. HR ranging from 80-105 bpm. Will follow.    Follow Up Recommendations  Outpatient PT     Equipment Recommendations  None recommended by PT    Recommendations for Other Services       Precautions / Restrictions Precautions Precautions: Fall Restrictions Weight Bearing Restrictions: No    Mobility  Bed Mobility Overal bed mobility: Needs Assistance Bed Mobility: Supine to Sit     Supine to sit: Modified independent (Device/Increase time)        Transfers Overall transfer level: Needs assistance Equipment used: None Transfers: Sit to/from Stand Sit to Stand: Modified independent (Device/Increase time)         General transfer comment: Stood impulsively without cues.   Ambulation/Gait Ambulation/Gait assistance: Supervision Ambulation Distance (Feet): 300 Feet Assistive device: None Gait Pattern/deviations: Step-through pattern;Decreased stride length;Wide base of support;Staggering right;Staggering left;Drifts  right/left Gait velocity: fast paced   General Gait Details: Fast paced gait with staggering to right/left and drifting noted esp with head turns. No overt LOb as pt able to self correct for instability. 2/4 DOE.   Stairs Stairs: Yes   Stair Management: Two rails;Alternating pattern Number of Stairs: 13 General stair comments: Cues to slow down for safety. Continuing to negotiate another flight despite cues to stop.  Wheelchair Mobility    Modified Rankin (Stroke Patients Only) Modified Rankin (Stroke Patients Only) Pre-Morbid Rankin Score: No symptoms Modified Rankin: Moderate disability     Balance Overall balance assessment: Needs assistance Sitting-balance support: Feet supported;No upper extremity supported Sitting balance-Leahy Scale: Normal Sitting balance - Comments: Able to adjust sock sitting EOB reaching outside BoS.    Standing balance support: During functional activity Standing balance-Leahy Scale: Good                   Standardized Balance Assessment Standardized Balance Assessment : Dynamic Gait Index   Dynamic Gait Index Level Surface: Mild Impairment Change in Gait Speed: Mild Impairment Gait with Horizontal Head Turns: Mild Impairment Gait with Vertical Head Turns: Normal Gait and Pivot Turn: Normal Step Over Obstacle: Normal Step Around Obstacles: Normal Steps: Mild Impairment Total Score: 20      Cognition Arousal/Alertness: Awake/alert Behavior During Therapy: Impulsive Overall Cognitive Status: Impaired/Different from baseline                           Safety/Judgement: Decreased awareness of safety Awareness: Emergent   General Comments: Poor safety awareness, only concerned about getting out of hospital. Requires repetition of cues to slow down and still chooses not  to follow them.      Exercises      General Comments        Pertinent Vitals/Pain Pain Assessment: No/denies pain    Home Living                       Prior Function            PT Goals (current goals can now be found in the care plan section) Progress towards PT goals: Progressing toward goals    Frequency    Min 4X/week      PT Plan Current plan remains appropriate    Co-evaluation              AM-PAC PT "6 Clicks" Daily Activity  Outcome Measure  Difficulty turning over in bed (including adjusting bedclothes, sheets and blankets)?: None Difficulty moving from lying on back to sitting on the side of the bed? : None Difficulty sitting down on and standing up from a chair with arms (e.g., wheelchair, bedside commode, etc,.)?: None Help needed moving to and from a bed to chair (including a wheelchair)?: None Help needed walking in hospital room?: A Little Help needed climbing 3-5 steps with a railing? : A Little 6 Click Score: 22    End of Session Equipment Utilized During Treatment: Gait belt Activity Tolerance: Patient tolerated treatment well Patient left: in bed;with call bell/phone within reach;with family/visitor present Nurse Communication: Mobility status PT Visit Diagnosis: Unsteadiness on feet (R26.81);Difficulty in walking, not elsewhere classified (R26.2)     Time: 4782-9562 PT Time Calculation (min) (ACUTE ONLY): 13 min  Charges:  $Neuromuscular Re-education: 8-22 mins                    G Codes:       Wray Kearns, PT, DPT [phone]     Marguarite Arbour A Aquiles Ruffini 11/12/2017, 10:33 AM
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
## 2017-11-12 NOTE — Progress Notes (Signed)
Discharge orders received.  Discharge instructions and follow-up appointments reviewed with the patient and his son.  VSS upon discharge.  IV removed and education complete.  All belongings sent with the patient.  Transported out via wheelchair. Verdell Dykman M, RN    

## 2017-11-13 ENCOUNTER — Telehealth: Payer: Self-pay

## 2017-11-13 ENCOUNTER — Telehealth: Payer: Self-pay | Admitting: Emergency Medicine

## 2017-11-13 DIAGNOSIS — E1165 Type 2 diabetes mellitus with hyperglycemia: Secondary | ICD-10-CM | POA: Diagnosis not present

## 2017-11-13 DIAGNOSIS — R008 Other abnormalities of heart beat: Secondary | ICD-10-CM

## 2017-11-13 DIAGNOSIS — I493 Ventricular premature depolarization: Secondary | ICD-10-CM

## 2017-11-13 NOTE — Telephone Encounter (Signed)
-----   Message from Sherren Mocha, MD sent at 11/12/2017  9:54 AM EST ----- [name] - this is a patient of mine in the hospital. Can you set him up for a 24 hr Holter and echo for PVC's/trigeminy? FU office visit after his studies are done.   thx!

## 2017-11-13 NOTE — Telephone Encounter (Signed)
Per Dr. Burt Knack, 24 hour holter and echo ordered for scheduling. Patient agrees with treatment plan.

## 2017-11-13 NOTE — Telephone Encounter (Signed)
Patient had been released yesterday; was told he was dehydrated and was given IVFs. He is feeling fine now.

## 2017-11-15 LAB — CULTURE, BLOOD (ROUTINE X 2)
Culture: NO GROWTH
Culture: NO GROWTH
Special Requests: ADEQUATE
Special Requests: ADEQUATE

## 2017-11-18 DIAGNOSIS — J209 Acute bronchitis, unspecified: Secondary | ICD-10-CM | POA: Diagnosis not present

## 2017-11-18 DIAGNOSIS — J029 Acute pharyngitis, unspecified: Secondary | ICD-10-CM | POA: Diagnosis not present

## 2017-11-19 ENCOUNTER — Ambulatory Visit: Payer: Medicare HMO | Admitting: Rehabilitative and Restorative Service Providers"

## 2017-11-19 NOTE — Telephone Encounter (Signed)
Holter and echo have been scheduled 1/2.

## 2017-11-20 ENCOUNTER — Ambulatory Visit: Payer: Medicare HMO | Admitting: Family Medicine

## 2017-11-20 ENCOUNTER — Encounter: Payer: Self-pay | Admitting: Family Medicine

## 2017-11-20 ENCOUNTER — Ambulatory Visit (INDEPENDENT_AMBULATORY_CARE_PROVIDER_SITE_OTHER): Payer: Medicare HMO | Admitting: Family Medicine

## 2017-11-20 DIAGNOSIS — M5136 Other intervertebral disc degeneration, lumbar region: Secondary | ICD-10-CM | POA: Diagnosis not present

## 2017-11-20 DIAGNOSIS — F4321 Adjustment disorder with depressed mood: Secondary | ICD-10-CM | POA: Diagnosis not present

## 2017-11-20 DIAGNOSIS — R69 Illness, unspecified: Secondary | ICD-10-CM | POA: Diagnosis not present

## 2017-11-20 DIAGNOSIS — Z634 Disappearance and death of family member: Secondary | ICD-10-CM | POA: Diagnosis not present

## 2017-11-20 NOTE — Assessment & Plan Note (Signed)
Discussed with patient in great length.  I do believe the patient likely has some spinal stenosis.  Patient does not want to do anything more aggressive including any imaging, injections, or any surgical intervention.  We discussed different medications as well which patient has declined.  Patient is not even taking the medications on a regular basis.  Patient has some other things that seem to be contributing including stress and recent grieving.  Follow-up again in 4 weeks

## 2017-11-20 NOTE — Assessment & Plan Note (Signed)
Daughter recently died.  We discussed with him about the possibility of discussing with another provider which patient declined, patient will may be discuss with primary care provider.  Patient feels that he does have a good support system, but denied any suicidal or homicidal ideation.  Feels like he is grieving appropriately.  Following up with me again in 3-4 weeks

## 2017-11-20 NOTE — Patient Instructions (Addendum)
Good to see you  Really appreciate you bringing lunch for everyone I am so sorry for your loss.  Ice is your friend.  Lets get back to the gym and your routine You can't take care of someone well until you take care of yourself.  I am here if you need me See me again in 4-6 weeks
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
## 2017-12-02 ENCOUNTER — Other Ambulatory Visit: Payer: Self-pay

## 2017-12-02 ENCOUNTER — Ambulatory Visit (INDEPENDENT_AMBULATORY_CARE_PROVIDER_SITE_OTHER): Payer: Medicare HMO

## 2017-12-02 ENCOUNTER — Ambulatory Visit (HOSPITAL_COMMUNITY): Payer: Medicare HMO | Attending: Cardiovascular Disease

## 2017-12-02 DIAGNOSIS — I255 Ischemic cardiomyopathy: Secondary | ICD-10-CM | POA: Insufficient documentation

## 2017-12-02 DIAGNOSIS — I493 Ventricular premature depolarization: Secondary | ICD-10-CM | POA: Diagnosis not present

## 2017-12-02 DIAGNOSIS — Z951 Presence of aortocoronary bypass graft: Secondary | ICD-10-CM | POA: Insufficient documentation

## 2017-12-02 DIAGNOSIS — E119 Type 2 diabetes mellitus without complications: Secondary | ICD-10-CM | POA: Diagnosis not present

## 2017-12-02 DIAGNOSIS — I081 Rheumatic disorders of both mitral and tricuspid valves: Secondary | ICD-10-CM | POA: Diagnosis not present

## 2017-12-02 DIAGNOSIS — E785 Hyperlipidemia, unspecified: Secondary | ICD-10-CM | POA: Diagnosis not present

## 2017-12-02 DIAGNOSIS — I517 Cardiomegaly: Secondary | ICD-10-CM | POA: Diagnosis not present

## 2017-12-02 DIAGNOSIS — R008 Other abnormalities of heart beat: Secondary | ICD-10-CM

## 2017-12-02 DIAGNOSIS — G4730 Sleep apnea, unspecified: Secondary | ICD-10-CM | POA: Diagnosis not present

## 2017-12-02 DIAGNOSIS — I4891 Unspecified atrial fibrillation: Secondary | ICD-10-CM | POA: Insufficient documentation

## 2017-12-02 DIAGNOSIS — I2510 Atherosclerotic heart disease of native coronary artery without angina pectoris: Secondary | ICD-10-CM | POA: Diagnosis not present

## 2017-12-31 ENCOUNTER — Encounter: Payer: Self-pay | Admitting: Cardiovascular Disease

## 2017-12-31 ENCOUNTER — Ambulatory Visit: Payer: Medicare HMO | Admitting: Cardiovascular Disease

## 2017-12-31 VITALS — BP 140/60 | HR 68 | Ht 71.0 in | Wt 245.6 lb

## 2017-12-31 DIAGNOSIS — I10 Essential (primary) hypertension: Secondary | ICD-10-CM | POA: Diagnosis not present

## 2017-12-31 DIAGNOSIS — E785 Hyperlipidemia, unspecified: Secondary | ICD-10-CM | POA: Diagnosis not present

## 2017-12-31 DIAGNOSIS — I2510 Atherosclerotic heart disease of native coronary artery without angina pectoris: Secondary | ICD-10-CM | POA: Diagnosis not present

## 2017-12-31 NOTE — Patient Instructions (Signed)
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
## 2018-01-01 ENCOUNTER — Ambulatory Visit: Payer: Medicare HMO | Admitting: Family Medicine

## 2018-03-31 ENCOUNTER — Telehealth: Payer: Self-pay | Admitting: Cardiovascular Disease

## 2018-03-31 DIAGNOSIS — I25119 Atherosclerotic heart disease of native coronary artery with unspecified angina pectoris: Secondary | ICD-10-CM

## 2018-03-31 NOTE — Telephone Encounter (Signed)
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   To Dr. Burt Knack for [place].

## 2018-03-31 NOTE — Telephone Encounter (Signed)
Pt is calling and want to speak to nurse concerning having a stress test. There was no order in the system. Please call pt to advise

## 2018-04-02 NOTE — Telephone Encounter (Signed)
GXT ordered and scheduled 5/15 per patient request. Instructions reviewed and patient agrees with treatment plan.

## 2018-04-02 NOTE — Telephone Encounter (Signed)
Please arrange exercise treadmill study as long as a treadmill test satisfies the DOT requirement. thanks
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
## 2018-04-13 ENCOUNTER — Encounter: Payer: Self-pay | Admitting: Family Medicine

## 2018-04-13 ENCOUNTER — Ambulatory Visit: Payer: Medicare HMO | Admitting: Family Medicine

## 2018-04-13 DIAGNOSIS — G5602 Carpal tunnel syndrome, left upper limb: Secondary | ICD-10-CM | POA: Diagnosis not present

## 2018-04-13 NOTE — Patient Instructions (Addendum)
Good to see you  Ice 20 minutes 2 times daily. Usually after activity and before bed. Wear the brace day and night for 1 week then nightly for 3 weeks.  Exercises 3 times a week.   Continue the gabapentin  For the leg pain lets adjust the seat with you moving up and coming down on the peddles

## 2018-04-13 NOTE — Assessment & Plan Note (Signed)
Patient given injection today and tolerated the procedure well.  We discussed icing regimen and home exercises.  Discussed which activities doing which wants to avoid.  Patient was to increase activity as tolerated.  Bracing at night.  Work with Product/process development scientist.  Follow-up again in 4 weeks

## 2018-04-14 ENCOUNTER — Ambulatory Visit (INDEPENDENT_AMBULATORY_CARE_PROVIDER_SITE_OTHER): Payer: Medicare HMO

## 2018-04-14 DIAGNOSIS — I25119 Atherosclerotic heart disease of native coronary artery with unspecified angina pectoris: Secondary | ICD-10-CM | POA: Diagnosis not present

## 2018-04-14 LAB — EXERCISE TOLERANCE TEST
Estimated workload: 9.9 METS
Exercise duration (min): 8 min
Exercise duration (sec): 1 s
MPHR: 146 {beats}/min
Peak HR: 141 {beats}/min
Percent HR: 96 %
RPE: 15
Rest HR: 69 {beats}/min

## 2018-05-31 ENCOUNTER — Encounter: Payer: Self-pay | Admitting: Family Medicine

## 2018-05-31 ENCOUNTER — Telehealth: Payer: Self-pay | Admitting: Internal Medicine

## 2018-05-31 ENCOUNTER — Ambulatory Visit (INDEPENDENT_AMBULATORY_CARE_PROVIDER_SITE_OTHER): Payer: Medicare HMO | Admitting: Family Medicine

## 2018-05-31 ENCOUNTER — Ambulatory Visit: Payer: Self-pay

## 2018-05-31 VITALS — BP 122/70 | HR 92 | Ht 71.0 in

## 2018-05-31 DIAGNOSIS — M25562 Pain in left knee: Secondary | ICD-10-CM

## 2018-05-31 DIAGNOSIS — S83412A Sprain of medial collateral ligament of left knee, initial encounter: Secondary | ICD-10-CM

## 2018-05-31 MED ORDER — MELOXICAM 7.5 MG PO TABS
7.5000 mg | ORAL_TABLET | Freq: Every day | ORAL | 0 refills | Status: DC
Start: 2018-05-31 — End: 2018-09-14

## 2018-05-31 NOTE — Telephone Encounter (Signed)
Pt calling to request that Dr. Tamala Julian call in a medication for pain and swelling in the left knee.  LOV: 05/31/18 with Dr. Tamala Julian

## 2018-05-31 NOTE — Assessment & Plan Note (Signed)
Does not appear to be full.  Likely underlying meniscal tear as well.  Hamstring distally torn.  This likely is going to contribute to significant pain and possibly some instability of the knee.  ACL seems to be intact.  Hinged brace given today.  Discussed icing regimen, topical anti-inflammatories, which activities of doing which wants to avoid.  Patient will follow-up with me again in 2 to 3 weeks to make sure patient is improving.  Worsening symptoms or swelling seek medical attention immediately

## 2018-05-31 NOTE — Patient Instructions (Signed)
Good to see you  Ice 20 minutes 2 times daily. Usually after activity and before bed. Wear brace daily until I see you again.  No twisting motions Try to get someone to cover for you at least this week if not 2 weeks.  Try to get knee above heart when possible  If it gets red and swollen call me but if I am out of town then go to emergency room.  See me again in 3-4 weeks

## 2018-05-31 NOTE — Telephone Encounter (Signed)
Copied from [place] [id number]. Topic: Quick Communication - See Telephone Encounter >> May 31, 2018 12:41 PM Burchel, Abbi R wrote: See Telephone encounter for: 05/31/18.  Pt is requesting that Dr Mancel Bale call in something for pain/swelling in left knee.  Please call pt to advise.  Pt CB#: [phone]  Pharmacy: [place], [place] - [address]  [phone] (Phone) [phone] (Fax)
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
## 2018-05-31 NOTE — Telephone Encounter (Signed)
Copied from [place] [id number]. Topic: Quick Communication - See Telephone Encounter >> May 31, 2018 12:41 PM [place], Abbi R wrote: See Telephone encounter for: 05/31/18.  Pt is requesting that Dr Tamala Julian call in something for pain/swelling in left knee.  Please call pt to advise.  Pt CB#: [phone]  Pharmacy: [facility], [place] [address]  [phone] (Phone) [phone] (Fax)    Previous msg says: Dr Mancel Bale

## 2018-06-01 NOTE — Telephone Encounter (Signed)
Dr Tamala Julian sent in meloxicam to pharmacy.

## 2018-06-02 ENCOUNTER — Telehealth: Payer: Self-pay

## 2018-06-02 NOTE — Telephone Encounter (Signed)
Spoke with patient's grandson Roselyn Reef about extent of damage to Milton' knee. Patient had provided verbal consent as stated in previous note to speak with Roselyn Reef.

## 2018-06-02 NOTE — Telephone Encounter (Signed)
Patient called in to let Dr. Tamala Julian know that he had blood in his stool for the past couple of days. There is no blood in his stool today. Patient feels that he was straining more due to his injury. Advised patient that Dr. Tamala Julian is out of town but that if the symptom returns that he will need to contact his PCP for an appointment or go into urgent care. Patient also gives verbal consent to speak with his grandson Hunner Garcon, who is working on his doctorate in physical therapy, about his injury. Would like for me to call him. Called [place] and left a voicemail to give me a call back.

## 2018-06-22 NOTE — Telephone Encounter (Signed)
This encounter was created in error - please disregard.
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
## 2018-06-24 ENCOUNTER — Ambulatory Visit: Payer: Medicare HMO | Admitting: Family Medicine

## 2018-06-25 ENCOUNTER — Ambulatory Visit: Payer: Self-pay

## 2018-06-25 ENCOUNTER — Ambulatory Visit: Payer: Medicare HMO | Admitting: Family Medicine

## 2018-06-25 ENCOUNTER — Encounter: Payer: Self-pay | Admitting: Family Medicine

## 2018-06-25 VITALS — BP 112/70 | HR 90 | Ht 71.0 in | Wt 254.0 lb

## 2018-06-25 DIAGNOSIS — M25562 Pain in left knee: Principal | ICD-10-CM

## 2018-06-25 DIAGNOSIS — S83412A Sprain of medial collateral ligament of left knee, initial encounter: Secondary | ICD-10-CM | POA: Diagnosis not present

## 2018-06-25 DIAGNOSIS — G8929 Other chronic pain: Secondary | ICD-10-CM

## 2018-06-25 DIAGNOSIS — M25521 Pain in right elbow: Secondary | ICD-10-CM

## 2018-06-25 NOTE — Patient Instructions (Signed)
Good to see you  Ice 20 minutes 2 times daily. Usually after activity and before bed. pennsaid pinkie amount topically 2 times daily as needed.  Compression sleeve may help as well.  Wear the brace with exercise and when doing your other crazy stuff  Lets give it another 6 weeks and see how you do

## 2018-06-25 NOTE — Assessment & Plan Note (Signed)
Interval healing noted.  Home exercises given.  I do believe that patient does have a potential patellar condylar defect as well.  Discussed icing regimen and home exercise.  Discussed continue bracing.  Patient declined formal physical therapy.  Follow-up again in 4 weeks

## 2018-06-28 ENCOUNTER — Telehealth: Payer: Self-pay | Admitting: *Deleted

## 2018-06-28 NOTE — Telephone Encounter (Signed)
Likely not going to make it worse but will be painful

## 2018-06-28 NOTE — Telephone Encounter (Signed)
Copied from [place] [id number]. Topic: General - Other >> Jun 25, 2018  2:50 PM Yvette Rack wrote: Reason for CRM: pt calling to see if he can use the riding mower that he doesn't have to use his left  leg its the vibration on the lawn mower he was wondering about

## 2018-06-29 NOTE — Telephone Encounter (Signed)
lmovm

## 2018-07-02 ENCOUNTER — Encounter: Payer: Self-pay | Admitting: Cardiovascular Disease

## 2018-07-12 ENCOUNTER — Telehealth: Payer: Self-pay

## 2018-07-12 NOTE — Telephone Encounter (Signed)
[name] requests Dr. Antionette Char letter is mailed to him. Mailed original with signature and copy. Patient was grateful for call.

## 2018-07-16 ENCOUNTER — Other Ambulatory Visit: Payer: Self-pay | Admitting: *Deleted

## 2018-07-16 DIAGNOSIS — S83412A Sprain of medial collateral ligament of left knee, initial encounter: Secondary | ICD-10-CM
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
## 2018-08-05 ENCOUNTER — Ambulatory Visit: Payer: Medicare HMO | Admitting: Family Medicine

## 2018-08-05 ENCOUNTER — Ambulatory Visit: Payer: Self-pay

## 2018-08-05 ENCOUNTER — Encounter: Payer: Self-pay | Admitting: Family Medicine

## 2018-08-05 ENCOUNTER — Ambulatory Visit (INDEPENDENT_AMBULATORY_CARE_PROVIDER_SITE_OTHER)
Admission: RE | Admit: 2018-08-05 | Discharge: 2018-08-05 | Disposition: A | Discharge status: Home / Self Care | Payer: Medicare HMO | Source: Ambulatory Visit | Attending: Family Medicine | Admitting: Family Medicine

## 2018-08-05 VITALS — BP 106/78 | HR 85 | Ht 71.0 in | Wt 254.0 lb

## 2018-08-05 DIAGNOSIS — S83412A Sprain of medial collateral ligament of left knee, initial encounter: Secondary | ICD-10-CM | POA: Diagnosis not present

## 2018-08-05 DIAGNOSIS — S82142K Displaced bicondylar fracture of left tibia, subsequent encounter for closed fracture with nonunion: Secondary | ICD-10-CM

## 2018-08-05 DIAGNOSIS — M1712 Unilateral primary osteoarthritis, left knee: Secondary | ICD-10-CM | POA: Diagnosis not present

## 2018-08-05 DIAGNOSIS — M25562 Pain in left knee: Secondary | ICD-10-CM

## 2018-08-05 DIAGNOSIS — G8929 Other chronic pain: Secondary | ICD-10-CM

## 2018-08-05 DIAGNOSIS — M7122 Synovial cyst of popliteal space [Baker], left knee: Secondary | ICD-10-CM | POA: Insufficient documentation

## 2018-08-05 DIAGNOSIS — S82142A Displaced bicondylar fracture of left tibia, initial encounter for closed fracture: Secondary | ICD-10-CM | POA: Insufficient documentation

## 2018-08-05 NOTE — Assessment & Plan Note (Signed)
Unfortunately no significant improvement at the moment.  Discussed icing regimen and home exercise.  Discussed which activities of doing which wants to avoid.  Patient given home exercises to strengthen hamstrings and the VMO.  Patient will be fitted with a custom brace secondary to the instability and patient's abnormal thigh to calf ratio.

## 2018-08-05 NOTE — Patient Instructions (Signed)
Good to see you  [name] is your friend.  We will get you a custom brace and they will call you  Exercises 3 times a week.   Xray of the knee.  See me again in 4ish weeks

## 2018-08-05 NOTE — Assessment & Plan Note (Signed)
Aspirated today.  Discussed compression and will give a custom brace to help with the instability.

## 2018-08-05 NOTE — Assessment & Plan Note (Signed)
Aspiration done today.  Discussed icing regimen and home exercise.  Discussed which activities to do which wants to avoid.  Patient does have a abnormal thigh to calf ratio and instability of the knee and patient will be fitted for a custom brace at this time.  X-rays pending for further evaluation.  Continue all other medications.  Home exercises given.  Instability also noted secondary to an MCL tear.  Follow-up again with me in 4 weeks could be a candidate for Visco supplementation

## 2018-08-05 NOTE — Assessment & Plan Note (Signed)
Patient really went for x-rays and did have what seemed to be a tibial plateau fracture.  Displacement noted greater than 2 mm.  Discussed with patient that this could be considered unstable.  Patient though feels like he is improving and wants to avoid any surgical intervention at the time.  Will encourage patient to continue with bracing.  Patient is very active and encouraged him to try to decrease certain activities when possible though.  Patient is following up with me again in 4 weeks and will discuss further.  Patient knows if any worsening symptoms that we need to get this done and sent to orthopedic surgery.  Patient is in agreement with the plan at the moment.  We will continue to monitor

## 2018-08-06 ENCOUNTER — Telehealth: Payer: Self-pay | Admitting: Family Medicine

## 2018-08-06 ENCOUNTER — Telehealth: Payer: Self-pay | Admitting: *Deleted

## 2018-08-06 NOTE — Telephone Encounter (Signed)
Talked to patient again. Discussed with him I am not optimistic of healing.  Told him I have discussed with Dr. Esperanza Richters and feel 2 percutaneous nails maybe be able to stabilize and surgical intervention does give [place] best long term prognosis.  Patient did not want surgical intervention initially but talking to his grandson then has reluctantly agreed to have it fixed.  We will refer to [facility] ortho urgently and hope to have this resolved soon.   Also discussed with his grandson York Cerise who is in PT who agreed with the plan

## 2018-08-06 NOTE — Telephone Encounter (Signed)
Can we call him. Tell him to stay active. My exercises 1 to 2 times a week

## 2018-08-06 NOTE — Telephone Encounter (Signed)
Copied from [place] [phone]. Topic: General - Other >> Aug 06, 2018  8:36 AM Yvette Rack wrote: Reason for CRM: pt calling stating that he has a lt knee FX since July 1st he wants to know if he should continue the exercises that Dr Tamala Julian wanted him to do please give him a call back at [phone]

## 2018-08-09 NOTE — Telephone Encounter (Signed)
Pt scheduled for today at 945am with Dr. Rhona Raider. Pt made aware.

## 2018-08-09 NOTE — Telephone Encounter (Signed)
Spoke with patient who is going to see orthopaedic surgeon today to figure out plan.

## 2018-08-12 ENCOUNTER — Other Ambulatory Visit: Payer: Self-pay | Admitting: Orthopaedic Surgery

## 2018-08-12 ENCOUNTER — Ambulatory Visit
Admission: RE | Admit: 2018-08-12 | Discharge: 2018-08-12 | Disposition: A | Discharge status: Home / Self Care | Payer: Medicare HMO | Source: Ambulatory Visit | Attending: Orthopaedic Surgery | Admitting: Orthopaedic Surgery

## 2018-08-12 DIAGNOSIS — M25562 Pain in left knee: Secondary | ICD-10-CM

## 2018-08-13 ENCOUNTER — Other Ambulatory Visit: Payer: Self-pay | Admitting: Orthopaedic Surgery

## 2018-08-18 ENCOUNTER — Telehealth: Payer: Self-pay

## 2018-08-18 ENCOUNTER — Encounter: Payer: Self-pay | Admitting: Cardiovascular Disease

## 2018-08-18 ENCOUNTER — Ambulatory Visit: Payer: Medicare HMO | Admitting: Cardiovascular Disease

## 2018-08-18 VITALS — BP 134/76 | HR 74 | Ht 71.0 in | Wt 253.0 lb

## 2018-08-18 DIAGNOSIS — I480 Paroxysmal atrial fibrillation: Secondary | ICD-10-CM | POA: Diagnosis not present

## 2018-08-18 DIAGNOSIS — I2510 Atherosclerotic heart disease of native coronary artery without angina pectoris: Secondary | ICD-10-CM | POA: Diagnosis not present

## 2018-08-18 DIAGNOSIS — Z01810 Encounter for preprocedural cardiovascular examination: Secondary | ICD-10-CM | POA: Diagnosis not present
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
## 2018-08-18 NOTE — Patient Instructions (Addendum)
Medication Instructions:  Your provider recommends that you continue on your current medications as directed. Please refer to the Current Medication list given to you today.    Labwork: None  Testing/Procedures: None  Follow-Up: You have an appointment with Dr. Burt Knack on February 21, 2019 at 8:20AM.  Any Other Special Instructions Will Be Listed Below (If Applicable). You are cleared for surgery!    If you need a refill on your cardiac medications before your next appointment, please call your pharmacy.

## 2018-08-18 NOTE — Telephone Encounter (Addendum)
   [facility] Pre-operative Risk Assessment    Request for surgical clearance:  1. What type of surgery is being performed? Open Treatment of Left Lateral Tibial Plateau Non-Union Fracture   2. When is this surgery scheduled? 09/13/18   3. What type of clearance is required (medical clearance vs. Pharmacy clearance to hold med vs. Both)? Both  4. Are there any medications that need to be held prior to surgery and how long? ASA   5. Practice name and name of physician performing surgery? [facility], Dr. Lucia Gaskins    6. What is your office phone number: [phone]    7.   What is your office fax number: [phone]  8.   Anesthesia type (None, local, MAC, general) ? Choice   [name] [name] 08/18/2018, 10:37 AM  _________________________________________________________________   (provider comments below)

## 2018-08-18 NOTE — Telephone Encounter (Signed)
   [name], MD  Chart reviewed as part of pre-operative protocol coverage. Pre-op clearance already addressed by colleagues. Pt seen by Dr. Burt Knack today and cleared for surgery.    "The patient is at low cardiac risk of his upcoming knee surgery with good functional capacity as outlined above.  He can proceed without further testing".   Will route this bundled recommendation to requesting provider via Epic fax function. Please call with questions.  Lyda Jester, PA-C 08/18/2018, 2:22 PM

## 2018-08-19 ENCOUNTER — Other Ambulatory Visit: Payer: Self-pay | Admitting: Orthopaedic Surgery

## 2018-08-19 ENCOUNTER — Telehealth: Payer: Self-pay

## 2018-08-19 NOTE — Telephone Encounter (Signed)
   Primary [place], MD  Chart reviewed as part of pre-operative protocol coverage. Pre-op clearance already addressed by colleagues in earlier phone notes.  Duplicate encounter. See original Clearance faxed 08/18/18.   Removing from pre op pool.   Lyda Jester, PA-C 08/19/2018, 1:36 PM

## 2018-08-19 NOTE — Telephone Encounter (Signed)
   [facility] Pre-operative Risk Assessment    Request for surgical clearance:  1. What type of surgery is being performed?  Open Treatment of Left Lateral Tibial Plateau non-union fracture   2. When is this surgery scheduled?  09/13/18   3. What type of clearance is required (medical clearance vs. Pharmacy clearance to hold med vs. Both)?  medical  4. Are there any medications that need to be held prior to surgery and how long?    5. Practice name and name of physician performing surgery?  [facility]/ Dr Lucia Gaskins  6. What is your office phone number [phone]    7.   What is your office fax number [phone]  8.   Anesthesia type (None, local, MAC, general) ?  choice   [name] 08/19/2018, 10:55 AM  _________________________________________________________________   (provider comments below)

## 2018-09-03 NOTE — Pre-Procedure Instructions (Signed)
[place]  09/03/2018      [place], [place] [address] [place] [address] Phone: [phone] Fax: [place], [place] [address] Phone: [phone] Fax: [place], [place] [address] [place] [address] Phone: [phone] Fax: [phone]    Your procedure is scheduled on September 13, 2018.  Report to [facility] Admitting at 530 AM.  Call this number if you have problems the morning of surgery:  [phone]   Remember:  Do not eat or drink after midnight.    Take these medicines the morning of surgery with A SIP OF WATER  Dymista nasal spray Metoprolol succinate (toprol XL) Eye drops-if needed Pantoprazole (protonix)  Follow your surgeon's instructions on when to hold/resume aspirin.  If no instructions were given call the office to determine how they would like to you take aspirin  7 days prior to surgery STOP taking any meloxicam (mobic), Aleve, Naproxen, Ibuprofen, Motrin, Advil, Goody's, BC's, all herbal medications, fish oil, and all vitamins   WHAT DO I DO ABOUT MY DIABETES MEDICATION?   Marland Kitchen Do not take oral diabetes medicines (pills) the morning of surgery-metformin (glucophage), pioglitazone (actos), jardiance.  Reviewed and Endorsed by [facility] Patient Education Committee, August 2015  How to Manage Your Diabetes Before and After Surgery  Why is it important to control my blood sugar before and after surgery? . Improving blood sugar levels before and after surgery helps healing and can limit problems. . A way of improving blood sugar control is eating a healthy diet by: o  Eating less sugar and carbohydrates o  Increasing activity/exercise o  Talking with your doctor about reaching your blood sugar  goals . High blood sugars (greater than 180 mg/dL) can raise your risk of infections and slow your recovery, so you will need to focus on controlling your diabetes during the weeks before surgery. . Make sure that the doctor who takes care of your diabetes knows about your planned surgery including the date and location.  How do I manage my blood sugar before surgery? . Check your blood sugar at least 4 times a day, starting 2 days before surgery, to make sure that the level is not too high or low. o Check your blood sugar the morning of your surgery when you wake up and every 2 hours until you get to the Short Stay unit. . If your blood sugar is less than 70 mg/dL, you will need to treat for low blood sugar: o Do not take insulin. o Treat a low blood sugar (less than 70 mg/dL) with  cup of clear juice (cranberry or apple), 4 glucose tablets, OR glucose gel. Recheck blood sugar in 15 minutes after treatment (to make sure it is greater than 70 mg/dL). If your blood sugar is not greater than 70 mg/dL on recheck, call [phone] o  for further instructions. . Report your blood sugar to the short stay nurse when you get to Short Stay.  . If you are admitted to the hospital after surgery: o Your blood sugar will be checked by the staff and you will probably be given insulin after surgery (instead of oral diabetes medicines) to make sure you have good blood sugar levels. o The goal for blood sugar control after surgery  is 80-180 mg/dL.    [name]- Preparing For Surgery  Before surgery, you can play an important role. Because skin is not sterile, your skin needs to be as free of germs as possible. You can reduce the number of germs on your skin by washing with CHG (chlorahexidine gluconate) Soap before surgery.  CHG is an antiseptic cleaner which kills germs and bonds with the skin to continue killing germs even after washing.    Oral Hygiene is also important to reduce your risk of infection.   Remember - BRUSH YOUR TEETH THE MORNING OF SURGERY WITH YOUR REGULAR TOOTHPASTE  Please do not use if you have an allergy to CHG or antibacterial soaps. If your skin becomes reddened/irritated stop using the CHG.  Do not shave (including legs and underarms) for at least 48 hours prior to first CHG shower. It is OK to shave your face.  Please follow these instructions carefully.   1. Shower the NIGHT BEFORE SURGERY and the MORNING OF SURGERY with CHG.   2. If you chose to wash your hair, wash your hair first as usual with your normal shampoo.  3. After you shampoo, rinse your hair and body thoroughly to remove the shampoo.  4. Use CHG as you would any other liquid soap. You can apply CHG directly to the skin and wash gently with a scrungie or a clean washcloth.   5. Apply the CHG Soap to your body ONLY FROM THE NECK DOWN.  Do not use on open wounds or open sores. Avoid contact with your eyes, ears, mouth and genitals (private parts). Wash Face and genitals (private parts)  with your normal soap.  6. Wash thoroughly, paying special attention to the area where your surgery will be performed.  7. Thoroughly rinse your body with warm water from the neck down.  8. DO NOT shower/wash with your normal soap after using and rinsing off the CHG Soap.  9. Pat yourself dry with a CLEAN TOWEL.  10. Wear CLEAN PAJAMAS to bed the night before surgery, wear comfortable clothes the morning of surgery  11. Place CLEAN SHEETS on your bed the night of your first shower and DO NOT SLEEP WITH PETS.  Day of Surgery:  Do not apply any deodorants/lotions.  Please wear clean clothes to the hospital/surgery center.   Remember to brush your teeth WITH YOUR REGULAR TOOTHPASTE.   Do not wear jewelry  Do not wear lotions, powders, or colognes, or deodorant.  Men may shave face and neck.  Do not bring valuables to the hospital.  [facility] is not responsible for any belongings or valuables.  Contacts,  dentures or bridgework may not be worn into surgery.  Leave your suitcase in the car.  After surgery it may be brought to your room.  For patients admitted to the hospital, discharge time will be determined by your treatment team.  Patients discharged the day of surgery will not be allowed to drive home.     Please read over the fact sheets that you were given.

## 2018-09-06 ENCOUNTER — Encounter (HOSPITAL_COMMUNITY)
Admission: RE | Admit: 2018-09-06 | Discharge: 2018-09-06 | Disposition: A | Discharge status: Home / Self Care | Payer: Medicare HMO | Source: Ambulatory Visit | Attending: Orthopaedic Surgery | Admitting: Orthopaedic Surgery

## 2018-09-06 ENCOUNTER — Other Ambulatory Visit: Payer: Self-pay

## 2018-09-06 ENCOUNTER — Encounter (HOSPITAL_COMMUNITY): Payer: Self-pay

## 2018-09-06 DIAGNOSIS — Z01812 Encounter for preprocedural laboratory examination: Secondary | ICD-10-CM | POA: Insufficient documentation

## 2018-09-06 LAB — BASIC METABOLIC PANEL
Anion gap: 9 (ref 5–15)
BUN: 19 mg/dL (ref 8–23)
CO2: 25 mmol/L (ref 22–32)
Calcium: 9.2 mg/dL (ref 8.9–10.3)
Chloride: 100 mmol/L (ref 98–111)
Creatinine, Ser: 0.94 mg/dL (ref 0.61–1.24)
GFR calc Af Amer: >60 mL/min (ref >60)
GFR calc non Af Amer: >60 mL/min (ref >60)
Glucose, Bld: 198 mg/dL — ABNORMAL HIGH (ref 70–99)
Potassium: 4.1 mmol/L (ref 3.5–5.1)
Sodium: 134 mmol/L — ABNORMAL LOW (ref 135–145)

## 2018-09-06 LAB — GLUCOSE, CAPILLARY: Glucose-Capillary: 182 mg/dL — ABNORMAL HIGH (ref 70–99)

## 2018-09-06 LAB — CBC
HCT: 46.4 % (ref 39.0–52.0)
Hemoglobin: 15.1 g/dL (ref 13.0–17.0)
MCH: 30.6 pg (ref 26.0–34.0)
MCHC: 32.5 g/dL (ref 30.0–36.0)
MCV: 93.9 fL (ref 78.0–100.0)
Platelets: 177 10*3/uL (ref 150–400)
RBC: 4.94 MIL/uL (ref 4.22–5.81)
RDW: 13.8 % (ref 11.5–15.5)
WBC: 6 10*3/uL (ref 4.0–10.5)

## 2018-09-06 LAB — HEMOGLOBIN A1C
Hgb A1c MFr Bld: 7.8 % — ABNORMAL HIGH (ref 4.8–5.6)
Mean Plasma Glucose: 177.16 mg/dL
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
## 2018-09-06 NOTE — Progress Notes (Signed)
PCP Lorene Dy MD  Cardiologist Dr. Sherren Mocha  Last [place] 08-18-2018  Cardiac clearance in Epic     Echo 12-21-2017  Stress test   04-14-2014  Does not check blood sugars at home

## 2018-09-07 ENCOUNTER — Ambulatory Visit: Payer: Medicare HMO | Admitting: Family Medicine

## 2018-09-12 NOTE — Anesthesia Preprocedure Evaluation (Addendum)
Anesthesia Evaluation  Patient identified by MRN, date of birth, ID band Patient awake    Reviewed: Allergy & Precautions, H&P , NPO status , Patient's Chart, lab work & pertinent test results  Airway Mallampati: IV  TM Distance: >3 FB Neck ROM: Full    Dental no notable dental hx. (+) Teeth Intact, Dental Advisory Given,    Pulmonary sleep apnea , former smoker,    Pulmonary exam normal breath sounds clear to auscultation       Cardiovascular Exercise Tolerance: Good hypertension, Pt. on medications and Pt. on home beta blockers + CAD and + Past MI  Normal cardiovascular exam Rhythm:Regular Rate:Normal  Echo 12/02/2017 Left ventricle: The cavity size was normal. There was moderate   concentric hypertrophy. Systolic function was mildly reduced. The   estimated ejection fraction was in the range of 45% to 50%.   Hypokinesis of the inferolateral and inferior myocardium.   Features are consistent with a pseudonormal left ventricular   filling pattern, with concomitant abnormal relaxation and   increased filling pressure (grade 2 diastolic dysfunction).   Doppler parameters are consistent with high ventricular filling   pressure.   Neuro/Psych negative psych ROS   GI/Hepatic hiatal hernia, GERD  Medicated and Controlled,  Endo/Other  diabetes, Type 2  Renal/GU Renal disease     Musculoskeletal  (+) Arthritis ,   Abdominal (+) + obese,   Peds  Hematology   Anesthesia Other Findings NSR, All to ASA  Reproductive/Obstetrics                         Anesthesia Physical Anesthesia Plan  ASA: III  Anesthesia Plan:    Post-op Pain Management:    Induction:   PONV Risk Score and Plan: 2 and Treatment may vary due to age or medical condition  Airway Management Planned: Natural Airway and Nasal Cannula  Additional Equipment:   Intra-op Plan: Delibrate Circulatory arrest per surgeon  request  Post-operative Plan:   Informed Consent: I have reviewed the patients History and Physical, chart, labs and discussed the procedure including the risks, benefits and alternatives for the proposed anesthesia with the patient or authorized representative who has indicated his/her understanding and acceptance.   Dental advisory given  Plan Discussed with:   Anesthesia Plan Comments:        Anesthesia Quick Evaluation

## 2018-09-13 ENCOUNTER — Observation Stay (HOSPITAL_COMMUNITY)
Admission: RE | Admit: 2018-09-13 | Discharge: 2018-09-18 | Disposition: A | Discharge status: Home / Self Care | Payer: Medicare HMO | Source: Ambulatory Visit | Attending: Orthopaedic Surgery | Admitting: Orthopaedic Surgery

## 2018-09-13 ENCOUNTER — Ambulatory Visit (HOSPITAL_COMMUNITY): Payer: Medicare HMO

## 2018-09-13 ENCOUNTER — Ambulatory Visit (HOSPITAL_COMMUNITY): Payer: Medicare HMO | Admitting: Certified Registered"

## 2018-09-13 ENCOUNTER — Encounter (HOSPITAL_COMMUNITY)
Admission: RE | Disposition: A | Discharge status: Home / Self Care | Payer: Self-pay | Source: Ambulatory Visit | Attending: Orthopaedic Surgery

## 2018-09-13 ENCOUNTER — Encounter (HOSPITAL_COMMUNITY): Payer: Self-pay | Admitting: Urology

## 2018-09-13 DIAGNOSIS — K219 Gastro-esophageal reflux disease without esophagitis: Secondary | ICD-10-CM | POA: Diagnosis not present

## 2018-09-13 DIAGNOSIS — I2510 Atherosclerotic heart disease of native coronary artery without angina pectoris: Secondary | ICD-10-CM | POA: Diagnosis not present

## 2018-09-13 DIAGNOSIS — I255 Ischemic cardiomyopathy: Secondary | ICD-10-CM | POA: Insufficient documentation

## 2018-09-13 DIAGNOSIS — M19012 Primary osteoarthritis, left shoulder: Secondary | ICD-10-CM | POA: Insufficient documentation

## 2018-09-13 DIAGNOSIS — X58XXXA Exposure to other specified factors, initial encounter: Secondary | ICD-10-CM | POA: Insufficient documentation

## 2018-09-13 DIAGNOSIS — M23252 Derangement of posterior horn of lateral meniscus due to old tear or injury, left knee: Secondary | ICD-10-CM | POA: Insufficient documentation

## 2018-09-13 DIAGNOSIS — Z419 Encounter for procedure for purposes other than remedying health state, unspecified: Secondary | ICD-10-CM

## 2018-09-13 DIAGNOSIS — E1129 Type 2 diabetes mellitus with other diabetic kidney complication: Secondary | ICD-10-CM | POA: Diagnosis present

## 2018-09-13 DIAGNOSIS — Z886 Allergy status to analgesic agent status: Secondary | ICD-10-CM | POA: Insufficient documentation

## 2018-09-13 DIAGNOSIS — I252 Old myocardial infarction: Secondary | ICD-10-CM | POA: Insufficient documentation

## 2018-09-13 DIAGNOSIS — I10 Essential (primary) hypertension: Secondary | ICD-10-CM | POA: Diagnosis present

## 2018-09-13 DIAGNOSIS — E1122 Type 2 diabetes mellitus with diabetic chronic kidney disease: Secondary | ICD-10-CM | POA: Insufficient documentation

## 2018-09-13 DIAGNOSIS — M19032 Primary osteoarthritis, left wrist: Secondary | ICD-10-CM | POA: Diagnosis not present

## 2018-09-13 DIAGNOSIS — I4819 Other persistent atrial fibrillation: Secondary | ICD-10-CM | POA: Diagnosis not present

## 2018-09-13 DIAGNOSIS — G4733 Obstructive sleep apnea (adult) (pediatric): Secondary | ICD-10-CM | POA: Insufficient documentation

## 2018-09-13 DIAGNOSIS — I129 Hypertensive chronic kidney disease with stage 1 through stage 4 chronic kidney disease, or unspecified chronic kidney disease: Secondary | ICD-10-CM | POA: Insufficient documentation

## 2018-09-13 DIAGNOSIS — Z7982 Long term (current) use of aspirin: Secondary | ICD-10-CM | POA: Insufficient documentation

## 2018-09-13 DIAGNOSIS — S82142A Displaced bicondylar fracture of left tibia, initial encounter for closed fracture: Secondary | ICD-10-CM | POA: Diagnosis not present

## 2018-09-13 DIAGNOSIS — S82202A Unspecified fracture of shaft of left tibia, initial encounter for closed fracture: Secondary | ICD-10-CM | POA: Diagnosis present

## 2018-09-13 DIAGNOSIS — N179 Acute kidney failure, unspecified: Secondary | ICD-10-CM | POA: Diagnosis not present

## 2018-09-13 DIAGNOSIS — M19011 Primary osteoarthritis, right shoulder: Secondary | ICD-10-CM | POA: Insufficient documentation

## 2018-09-13 DIAGNOSIS — Z6833 Body mass index (BMI) 33.0-33.9, adult: Secondary | ICD-10-CM | POA: Insufficient documentation

## 2018-09-13 DIAGNOSIS — Z888 Allergy status to other drugs, medicaments and biological substances status: Secondary | ICD-10-CM | POA: Insufficient documentation

## 2018-09-13 DIAGNOSIS — N182 Chronic kidney disease, stage 2 (mild): Secondary | ICD-10-CM | POA: Diagnosis not present

## 2018-09-13 DIAGNOSIS — M19031 Primary osteoarthritis, right wrist: Secondary | ICD-10-CM | POA: Insufficient documentation

## 2018-09-13 DIAGNOSIS — Z951 Presence of aortocoronary bypass graft: Secondary | ICD-10-CM | POA: Diagnosis not present

## 2018-09-13 DIAGNOSIS — M47819 Spondylosis without myelopathy or radiculopathy, site unspecified: Secondary | ICD-10-CM | POA: Diagnosis not present

## 2018-09-13 DIAGNOSIS — Z79899 Other long term (current) drug therapy: Secondary | ICD-10-CM | POA: Insufficient documentation

## 2018-09-13 DIAGNOSIS — Z7984 Long term (current) use of oral hypoglycemic drugs: Secondary | ICD-10-CM | POA: Insufficient documentation

## 2018-09-13 DIAGNOSIS — Z87891 Personal history of nicotine dependence: Secondary | ICD-10-CM | POA: Insufficient documentation

## 2018-09-13 DIAGNOSIS — E785 Hyperlipidemia, unspecified: Secondary | ICD-10-CM | POA: Diagnosis not present

## 2018-09-13 HISTORY — PX: ORIF TIBIA PLATEAU: SHX2132

## 2018-09-13 LAB — CBC
HCT: 42.6 % (ref 39.0–52.0)
Hemoglobin: 13.3 g/dL (ref 13.0–17.0)
MCH: 28.7 pg (ref 26.0–34.0)
MCHC: 31.2 g/dL (ref 30.0–36.0)
MCV: 92 fL (ref 80.0–100.0)
Platelets: 184 10*3/uL (ref 150–400)
RBC: 4.63 MIL/uL (ref 4.22–5.81)
RDW: 14 % (ref 11.5–15.5)
WBC: 6.6 10*3/uL (ref 4.0–10.5)
nRBC: 0 % (ref 0.0–0.2)

## 2018-09-13 LAB — GLUCOSE, CAPILLARY
Glucose-Capillary: 142 mg/dL — ABNORMAL HIGH (ref 70–99)
Glucose-Capillary: 169 mg/dL — ABNORMAL HIGH (ref 70–99)

## 2018-09-13 LAB — CREATININE, SERUM
Creatinine, Ser: 1.15 mg/dL (ref 0.61–1.24)
GFR calc Af Amer: >60 mL/min (ref >60)
GFR calc non Af Amer: >60 mL/min (ref >60)

## 2018-09-13 SURGERY — OPEN REDUCTION INTERNAL FIXATION (ORIF) TIBIAL PLATEAU
Anesthesia: Spinal | Site: Knee | Laterality: Left

## 2018-09-13 MED ORDER — METOCLOPRAMIDE HCL 5 MG/ML IJ SOLN: 5.0000 mg | Freq: Three times a day (TID) | INTRAMUSCULAR | Status: DC | PRN

## 2018-09-13 MED ORDER — ACETAMINOPHEN 10 MG/ML IV SOLN
1000.0000 mg | Freq: Once | INTRAVENOUS | Status: DC | PRN
  Administered 2018-09-13: 1000 mg via INTRAVENOUS

## 2018-09-13 MED ORDER — PROPOFOL 10 MG/ML IV BOLUS
INTRAVENOUS | Status: AC
  Filled 2018-09-13: qty 20

## 2018-09-13 MED ORDER — ONDANSETRON HCL 4 MG PO TABS: 4.0000 mg | ORAL_TABLET | Freq: Four times a day (QID) | ORAL | Status: DC | PRN

## 2018-09-13 MED ORDER — ONDANSETRON HCL 4 MG/2ML IJ SOLN
INTRAMUSCULAR | Status: DC | PRN
  Administered 2018-09-13: 4 mg via INTRAVENOUS

## 2018-09-13 MED ORDER — HYDROMORPHONE HCL 1 MG/ML IJ SOLN
0.5000 mg | INTRAMUSCULAR | Status: DC | PRN
  Administered 2018-09-13 – 2018-09-15 (×5): 1 mg via INTRAVENOUS
  Filled 2018-09-13 (×4): qty 1

## 2018-09-13 MED ORDER — DEXAMETHASONE SODIUM PHOSPHATE 10 MG/ML IJ SOLN
INTRAMUSCULAR | Status: DC | PRN
  Administered 2018-09-13: 4 mg via INTRAVENOUS

## 2018-09-13 MED ORDER — DOCUSATE SODIUM 100 MG PO CAPS
100.0000 mg | ORAL_CAPSULE | Freq: Two times a day (BID) | ORAL | Status: DC
  Administered 2018-09-13 – 2018-09-18 (×10): 100 mg via ORAL
  Filled 2018-09-13 (×10): qty 1

## 2018-09-13 MED ORDER — ACETAMINOPHEN 500 MG PO TABS
1000.0000 mg | ORAL_TABLET | Freq: Four times a day (QID) | ORAL | Status: AC
  Administered 2018-09-13 – 2018-09-14 (×4): 1000 mg via ORAL
  Filled 2018-09-13 (×4): qty 2

## 2018-09-13 MED ORDER — LACTATED RINGERS IV SOLN
INTRAVENOUS | Status: DC | PRN
  Administered 2018-09-13 (×2): via INTRAVENOUS

## 2018-09-13 MED ORDER — OXYCODONE HCL 5 MG PO TABS
5.0000 mg | ORAL_TABLET | ORAL | Status: DC | PRN
  Administered 2018-09-13: 5 mg via ORAL
  Administered 2018-09-13 – 2018-09-14 (×2): 10 mg via ORAL
  Administered 2018-09-14: 5 mg via ORAL
  Administered 2018-09-14 – 2018-09-16 (×4): 10 mg via ORAL
  Filled 2018-09-13: qty 2
  Filled 2018-09-13: qty 1
  Filled 2018-09-13 (×6): qty 2
  Filled 2018-09-13: qty 1
  Filled 2018-09-13 (×3): qty 2

## 2018-09-13 MED ORDER — EPHEDRINE 5 MG/ML INJ
INTRAVENOUS | Status: AC
  Filled 2018-09-13: qty 10

## 2018-09-13 MED ORDER — ONDANSETRON HCL 4 MG/2ML IJ SOLN: 4.0000 mg | Freq: Once | INTRAMUSCULAR | Status: DC | PRN

## 2018-09-13 MED ORDER — FENTANYL CITRATE (PF) 100 MCG/2ML IJ SOLN
25.0000 ug | INTRAMUSCULAR | Status: DC | PRN
  Administered 2018-09-13: 25 ug via INTRAVENOUS

## 2018-09-13 MED ORDER — PROPOFOL 500 MG/50ML IV EMUL
INTRAVENOUS | Status: DC | PRN
  Administered 2018-09-13: 25 ug/kg/min via INTRAVENOUS
  Administered 2018-09-13: 10:00:00 via INTRAVENOUS

## 2018-09-13 MED ORDER — HYDROMORPHONE HCL 1 MG/ML IJ SOLN
INTRAMUSCULAR | Status: AC
  Filled 2018-09-13: qty 1

## 2018-09-13 MED ORDER — DEXAMETHASONE SODIUM PHOSPHATE 10 MG/ML IJ SOLN
INTRAMUSCULAR | Status: AC
  Filled 2018-09-13: qty 1

## 2018-09-13 MED ORDER — FENTANYL CITRATE (PF) 100 MCG/2ML IJ SOLN
INTRAMUSCULAR | Status: AC
  Filled 2018-09-13: qty 2

## 2018-09-13 MED ORDER — SODIUM CHLORIDE 0.9 % IV SOLN
INTRAVENOUS | Status: DC | PRN
  Administered 2018-09-13: 30 ug/min via INTRAVENOUS

## 2018-09-13 MED ORDER — PHENYLEPHRINE 40 MCG/ML (10ML) SYRINGE FOR IV PUSH (FOR BLOOD PRESSURE SUPPORT)
PREFILLED_SYRINGE | INTRAVENOUS | Status: DC | PRN
  Administered 2018-09-13: 80 ug via INTRAVENOUS

## 2018-09-13 MED ORDER — PHENYLEPHRINE 40 MCG/ML (10ML) SYRINGE FOR IV PUSH (FOR BLOOD PRESSURE SUPPORT)
PREFILLED_SYRINGE | INTRAVENOUS | Status: AC
  Filled 2018-09-13: qty 10

## 2018-09-13 MED ORDER — EPHEDRINE SULFATE 50 MG/ML IJ SOLN
INTRAMUSCULAR | Status: DC | PRN
  Administered 2018-09-13 (×3): 10 mg via INTRAVENOUS
  Administered 2018-09-13 (×2): 5 mg via INTRAVENOUS
  Administered 2018-09-13: 10 mg via INTRAVENOUS
  Administered 2018-09-13: 5 mg via INTRAVENOUS
  Administered 2018-09-13: 10 mg via INTRAVENOUS

## 2018-09-13 MED ORDER — 0.9 % SODIUM CHLORIDE (POUR BTL) OPTIME
TOPICAL | Status: DC | PRN
  Administered 2018-09-13: 1000 mL

## 2018-09-13 MED ORDER — PROPOFOL 10 MG/ML IV BOLUS
INTRAVENOUS | Status: DC | PRN
  Administered 2018-09-13 (×10): 20 mg via INTRAVENOUS

## 2018-09-13 MED ORDER — ACETAMINOPHEN 10 MG/ML IV SOLN
INTRAVENOUS | Status: AC
  Filled 2018-09-13: qty 100

## 2018-09-13 MED ORDER — CEFAZOLIN SODIUM-DEXTROSE 2-4 GM/100ML-% IV SOLN
2.0000 g | INTRAVENOUS | Status: AC
  Administered 2018-09-13: 2 g via INTRAVENOUS
  Filled 2018-09-13: qty 100

## 2018-09-13 MED ORDER — LIDOCAINE 2% (20 MG/ML) 5 ML SYRINGE
INTRAMUSCULAR | Status: DC | PRN
  Administered 2018-09-13: 20 mg via INTRAVENOUS
  Administered 2018-09-13: 40 mg via INTRAVENOUS

## 2018-09-13 MED ORDER — CEFAZOLIN SODIUM-DEXTROSE 2-4 GM/100ML-% IV SOLN
2.0000 g | Freq: Four times a day (QID) | INTRAVENOUS | Status: AC
  Administered 2018-09-13 – 2018-09-14 (×3): 2 g via INTRAVENOUS
  Filled 2018-09-13 (×3): qty 100

## 2018-09-13 MED ORDER — FENTANYL CITRATE (PF) 250 MCG/5ML IJ SOLN
INTRAMUSCULAR | Status: AC
  Filled 2018-09-13: qty 5

## 2018-09-13 MED ORDER — GABAPENTIN 300 MG PO CAPS
300.0000 mg | ORAL_CAPSULE | Freq: Once | ORAL | Status: AC
  Administered 2018-09-13: 300 mg via ORAL
  Filled 2018-09-13: qty 1

## 2018-09-13 MED ORDER — METOCLOPRAMIDE HCL 5 MG PO TABS: 5.0000 mg | ORAL_TABLET | Freq: Three times a day (TID) | ORAL | Status: DC | PRN

## 2018-09-13 MED ORDER — DEXMEDETOMIDINE HCL 200 MCG/2ML IV SOLN
INTRAVENOUS | Status: DC | PRN
  Administered 2018-09-13 (×5): 4 ug via INTRAVENOUS

## 2018-09-13 MED ORDER — ENOXAPARIN SODIUM 40 MG/0.4ML ~~LOC~~ SOLN
40.0000 mg | SUBCUTANEOUS | Status: DC
  Administered 2018-09-14 – 2018-09-18 (×5): 40 mg via SUBCUTANEOUS
  Filled 2018-09-13 (×5): qty 0.4

## 2018-09-13 MED ORDER — ONDANSETRON HCL 4 MG/2ML IJ SOLN
INTRAMUSCULAR | Status: AC
  Filled 2018-09-13: qty 2

## 2018-09-13 MED ORDER — PROPOFOL 1000 MG/100ML IV EMUL
INTRAVENOUS | Status: AC
  Filled 2018-09-13: qty 100

## 2018-09-13 MED ORDER — BUPIVACAINE IN DEXTROSE 0.75-8.25 % IT SOLN
INTRATHECAL | Status: DC | PRN
  Administered 2018-09-13: 2 mL via INTRATHECAL

## 2018-09-13 MED ORDER — ACETAMINOPHEN 500 MG PO TABS
1000.0000 mg | ORAL_TABLET | Freq: Once | ORAL | Status: AC
  Administered 2018-09-13: 1000 mg via ORAL
  Filled 2018-09-13: qty 2

## 2018-09-13 MED ORDER — ONDANSETRON HCL 4 MG/2ML IJ SOLN: 4.0000 mg | Freq: Four times a day (QID) | INTRAMUSCULAR | Status: DC | PRN

## 2018-09-13 MED ORDER — ARTIFICIAL TEARS OPHTHALMIC OINT
TOPICAL_OINTMENT | OPHTHALMIC | Status: AC
  Filled 2018-09-13: qty 3.5

## 2018-09-13 MED ORDER — LIDOCAINE 2% (20 MG/ML) 5 ML SYRINGE
INTRAMUSCULAR | Status: AC
  Filled 2018-09-13: qty 5

## 2018-09-13 MED ORDER — OXYCODONE HCL 5 MG PO TABS
10.0000 mg | ORAL_TABLET | ORAL | Status: DC | PRN
  Administered 2018-09-13 – 2018-09-14 (×4): 15 mg via ORAL
  Administered 2018-09-15 – 2018-09-16 (×3): 10 mg via ORAL
  Administered 2018-09-18: 15 mg via ORAL
  Filled 2018-09-13 (×5): qty 3

## 2018-09-13 MED ORDER — GABAPENTIN 300 MG PO CAPS
300.0000 mg | ORAL_CAPSULE | Freq: Three times a day (TID) | ORAL | Status: DC
  Administered 2018-09-13 – 2018-09-18 (×15): 300 mg via ORAL
  Filled 2018-09-13 (×15): qty 1

## 2018-09-13 SURGICAL SUPPLY — 77 items
BANDAGE ACE 4X5 VEL STRL LF (GAUZE/BANDAGES/DRESSINGS) ×2 IMPLANT
BANDAGE ACE 6X5 VEL STRL LF (GAUZE/BANDAGES/DRESSINGS) ×2 IMPLANT
BANDAGE ELASTIC 4 VELCRO ST LF (GAUZE/BANDAGES/DRESSINGS) ×1 IMPLANT
BANDAGE ESMARK 6X9 LF (GAUZE/BANDAGES/DRESSINGS) ×1 IMPLANT
BLADE CLIPPER SURG (BLADE) IMPLANT
BLADE SURG 15 STRL LF DISP TIS (BLADE) ×1 IMPLANT
BLADE SURG 15 STRL SS (BLADE) ×2
BNDG CMPR 9X6 STRL LF SNTH (GAUZE/BANDAGES/DRESSINGS) ×1
BNDG ESMARK 6X9 LF (GAUZE/BANDAGES/DRESSINGS) ×2
BNDG GAUZE ELAST 4 BULKY (GAUZE/BANDAGES/DRESSINGS) ×2 IMPLANT
BRUSH SCRUB SURG 4.25 DISP (MISCELLANEOUS) ×4 IMPLANT
CANISTER SUCT 3000ML PPV (MISCELLANEOUS) ×2 IMPLANT
CHLORAPREP W/TINT 26ML (MISCELLANEOUS) ×4 IMPLANT
COVER SURGICAL LIGHT HANDLE (MISCELLANEOUS) ×2 IMPLANT
COVER WAND RF STERILE (DRAPES) ×2 IMPLANT
CUFF TOURNIQUET SINGLE 34IN LL (TOURNIQUET CUFF) ×2 IMPLANT
DRAPE C-ARM 42X72 X-RAY (DRAPES) ×2 IMPLANT
DRAPE C-ARMOR (DRAPES) ×2 IMPLANT
DRAPE ORTHO SPLIT 77X108 STRL (DRAPES) ×4
DRAPE SURG ORHT 6 SPLT 77X108 (DRAPES) ×2 IMPLANT
DRAPE U-SHAPE 47X51 STRL (DRAPES) ×2 IMPLANT
DRSG PAD ABDOMINAL 8X10 ST (GAUZE/BANDAGES/DRESSINGS) ×4 IMPLANT
ELECT CAUTERY BLADE 6.4 (BLADE) ×1 IMPLANT
ELECT REM PT RETURN 9FT ADLT (ELECTROSURGICAL) ×2
ELECTRODE REM PT RTRN 9FT ADLT (ELECTROSURGICAL) ×1 IMPLANT
GAUZE SPONGE 4X4 12PLY STRL (GAUZE/BANDAGES/DRESSINGS) ×2 IMPLANT
GAUZE SPONGE 4X4 12PLY STRL LF (GAUZE/BANDAGES/DRESSINGS) ×1 IMPLANT
GAUZE XEROFORM 5X9 LF (GAUZE/BANDAGES/DRESSINGS) ×1 IMPLANT
GLOVE BIO SURGEON STRL SZ7.5 (GLOVE) ×8 IMPLANT
GLOVE BIOGEL PI IND STRL 7.5 (GLOVE) ×1 IMPLANT
GLOVE BIOGEL PI INDICATOR 7.5 (GLOVE) ×1
GOWN STRL REUS W/ TWL LRG LVL3 (GOWN DISPOSABLE) ×2 IMPLANT
GOWN STRL REUS W/TWL LRG LVL3 (GOWN DISPOSABLE) ×4
IMMOBILIZER KNEE 22 (SOFTGOODS) ×1 IMPLANT
IMMOBILIZER KNEE 22 UNIV (SOFTGOODS) ×2 IMPLANT
KIT BASIN OR (CUSTOM PROCEDURE TRAY) ×2 IMPLANT
KIT TURNOVER KIT B (KITS) ×2 IMPLANT
NDL SUT 6 .5 CRC .975X.05 MAYO (NEEDLE) ×1 IMPLANT
NEEDLE MAYO TAPER (NEEDLE) ×2
NS IRRIG 1000ML POUR BTL (IV SOLUTION) ×2 IMPLANT
PACK TOTAL JOINT (CUSTOM PROCEDURE TRAY) ×2 IMPLANT
PAD ARMBOARD 7.5X6 YLW CONV (MISCELLANEOUS) ×4 IMPLANT
PAD CAST 4YDX4 CTTN HI CHSV (CAST SUPPLIES) ×1 IMPLANT
PADDING CAST COTTON 4X4 STRL (CAST SUPPLIES) ×2
PADDING CAST COTTON 6X4 STRL (CAST SUPPLIES) ×2 IMPLANT
PLATE LCP 3.5 1/3 TUB 5HX57 (Plate) ×1 IMPLANT
PLATE LCP 3.5 7H 98 (Plate) ×1 IMPLANT
SCREW HEADED ST 3.5X34 (Screw) ×1 IMPLANT
SCREW HEADED ST 3.5X40 (Screw) ×2 IMPLANT
SCREW HEADED ST 3.5X46 (Screw) ×1 IMPLANT
SCREW HEADED ST 3.5X48 (Screw) ×1 IMPLANT
SCREW HEADED ST 3.5X50 (Screw) ×1 IMPLANT
SCREW HEADED ST 3.5X55 (Screw) ×1 IMPLANT
SCREW HEADED ST 3.5X60 (Screw) ×1 IMPLANT
SCREW HEADED ST 3.5X75 (Screw) ×2 IMPLANT
SCREW HEADED ST 3.5X80 (Screw) ×1 IMPLANT
SPONGE LAP 18X18 X RAY DECT (DISPOSABLE) ×2 IMPLANT
STAPLER VISISTAT 35W (STAPLE) ×2 IMPLANT
SUCTION FRAZIER HANDLE 10FR (MISCELLANEOUS) ×1
SUCTION TUBE FRAZIER 10FR DISP (MISCELLANEOUS) ×1 IMPLANT
SUT ETHIBOND NAB CT1 #1 30IN (SUTURE) ×2 IMPLANT
SUT ETHILON 2 0 FS 18 (SUTURE) ×3 IMPLANT
SUT ETHILON 3 0 PS 1 (SUTURE) IMPLANT
SUT FIBERWIRE #2 38 T-5 BLUE (SUTURE)
SUT MON AB 2-0 CT1 36 (SUTURE) ×1 IMPLANT
SUT VIC AB 0 CT1 27 (SUTURE)
SUT VIC AB 0 CT1 27XBRD ANBCTR (SUTURE) IMPLANT
SUT VIC AB 1 CT1 18XCR BRD 8 (SUTURE) IMPLANT
SUT VIC AB 1 CT1 27 (SUTURE) ×2
SUT VIC AB 1 CT1 27XBRD ANBCTR (SUTURE) ×1 IMPLANT
SUT VIC AB 1 CT1 8-18 (SUTURE)
SUT VIC AB 2-0 CT1 27 (SUTURE) ×4
SUT VIC AB 2-0 CT1 TAPERPNT 27 (SUTURE) ×2 IMPLANT
SUTURE FIBERWR #2 38 T-5 BLUE (SUTURE) IMPLANT
TOWEL OR 17X26 10 PK STRL BLUE (TOWEL DISPOSABLE) ×4 IMPLANT
TRAY FOLEY MTR SLVR 16FR STAT (SET/KITS/TRAYS/PACK) IMPLANT
WATER STERILE IRR 1000ML POUR (IV SOLUTION) ×4 IMPLANT

## 2018-09-13 NOTE — Anesthesia Postprocedure Evaluation (Signed)
Anesthesia Post Note  Patient: [name]  Procedure(s) Performed: OPEN TREATMENT OF LEFT TIBIAL PLATEAU REPAIR, NONUNION TIBIA REPAIR PARTIAL MENISECTOMY (Left Knee)     Patient location during evaluation: PACU Anesthesia Type: Spinal Level of consciousness: oriented and awake and alert Pain management: pain level controlled Vital Signs Assessment: post-procedure vital signs reviewed and stable Respiratory status: spontaneous breathing, respiratory function stable and patient connected to nasal cannula oxygen Cardiovascular status: blood pressure returned to baseline and stable Postop Assessment: no headache, no backache and no apparent nausea or vomiting Anesthetic complications: no    Last Vitals:  Vitals:   09/13/18 1120 09/13/18 1135  BP: (!) 121/59 108/69  Pulse: 79 78  Resp: 17 20  Temp:    SpO2: 92% 93%    Last Pain:  Vitals:   09/13/18 1125  TempSrc:   PainSc: 6                  Barnet Glasgow

## 2018-09-13 NOTE — Transfer of Care (Signed)
Immediate Anesthesia Transfer of Care Note  Patient: [name]  Procedure(s) Performed: OPEN TREATMENT OF LEFT TIBIAL PLATEAU REPAIR, NONUNION TIBIA REPAIR PARTIAL MENISECTOMY (Left Knee)  Patient Location: PACU  Anesthesia Type:MAC and Spinal  Level of Consciousness: awake, alert  and oriented  Airway & Oxygen Therapy: Patient Spontanous Breathing and Patient connected to face mask oxygen  Post-op Assessment: Report given to RN, Post -op Vital signs reviewed and stable and Patient moving all extremities  Post vital signs: Reviewed and stable  Last Vitals:  Vitals Value Taken Time  BP 100/55 09/13/2018 10:56 AM  Temp    Pulse 81 09/13/2018 10:58 AM  Resp 17 09/13/2018 10:58 AM  SpO2 93 % 09/13/2018 10:58 AM  Vitals shown include unvalidated device data.  Last Pain:  Vitals:   09/13/18 0652  TempSrc:   PainSc: 0-No pain         Complications: No apparent anesthesia complications
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
## 2018-09-13 NOTE — Plan of Care (Signed)

## 2018-09-13 NOTE — Op Note (Signed)
[name] male 77 y.o. 09/13/2018  PreOperative Diagnosis: Left lateral tibial plateau nonunion  PostOperative Diagnosis: Left lateral tibial plateau nonunion Left posterior horn lateral meniscal tear   Procedure(s) and Anesthesia Type: OPEN TREATMENT OF LEFT TIBIAL PLATEAU  REPAIR, NONUNION TIBIA  OPEN PARTIAL LATERAL MENISECTOMY -   Anesthesia: Spinal  Surgeon: Erle Crocker   Assistants: none  Anesthesia: Spinal anesthesia   Findings: Left lateral tibial plateau nonunion with abundant fibrous tissue. Left posterior horn lateral meniscus tear. Joint depression  Implants: Synthes one third tubular rim plate with 9.6VE screws LCD buttress plate with 9.3YB screws.  Indications:77 y.o. male who sustained an injury to his left knee on July 1.  He was seen in outside facility but no x-rays were obtained.  He proceeded with weightbearing as tolerated.  He continued to have knee pain and therefore was seen by [place] and was diagnosed with a left lateral tibial plateau with likely nonunion.  CT scan was obtained which showed a nonunion with 4 mm of joint step-off.  He was indicated for open reduction internal fixation of his left lateral tibial plateau fracture with repair of nonunion and partial meniscectomy as needed.  After risks, benefits and alternatives to surgery were discussed with him he wished to proceed.  The risks included but not limited to wound healing complications, infection, malunion or nonunion, need for secondary surgery, progression of arthritis, less than optimal outcome as well as anesthetic risk.  He understood the postoperative course and the need to be completely nonweightbearing on the left lower extremity and he agreed to this.  Procedure Detail: The patient was seen in the preoperative holding area and the left lower extremity was identified as the appropriate operative extremity marked by myself.  The consent was signed by myself and the  patient.  The patient was taken to the operating room and spinal anesthesia was done.  Once the block set up a propofol sedation was used.  A bump was placed in the left hip and all bony prominences were well-padded.  He was placed supine on the operative table.  A thigh tourniquet was placed on the left.  The left lower extremity was prepped and draped in usual sterile fashion.  Triangle was placed on the knee.  A standard anterolateral approach to the tibia was made.  This was taken sharply down through skin and subcu tenuous tissue.  I was unable to identify the fascia overlying the anterior compartment and the tibialis anterior muscle at the tibial crest.  The fascia was incised sharply and the tibialis anterior muscle was released from the proximal lateral aspect of the tibia down to expose the distal aspect of the fracture site.  Then periosteal elevator was used to free up the tibialis anterior muscle.  Subperiosteally distal to the fracture site for plate placement.  Then this was taken down along the lateral aspect of the tibia.  Then more proximally the Gerdie's tubercle was released with an osteotome reflecting back to the iliotibial band.  Then was able to visualize the joint capsule and a sub-meniscal arthrotomy was made in a transverse fashion.  I was able to see into the joint and some meniscal region.  There was identified a fracture step-off.  Then using fluoroscopy is able to identify the nonunion and using an osteotome was able to free up the fracture fragment.  Between the fragment and the remaining tibia there was a robust amount of fibrous tissue.  This was curetted and  rondure doubt cleaning out the nonunion site.  I was able to clean this out back to healthy bleeding cancellus bone on both the fragment and the remaining tibia.  I made a small stab incision on the medial aspect the proximal tibia and then using a periarticular reduction clamp and ball spike pusher was able to reduce the  fragment in place and hold it provisionally in place with K wires.  Then the appropriate reduction was confirmed on AP and Lat fluoroscopy. I attempted using pre-formed lateral tibial plate however this did not fit on his bone well so I contoured a LCDC plate and placed it in a buttress fashion.  Then a 3 hole one third tubular rim plate was used with subchondral rafting screws.  All screw length and position were confirmed on fluoroscopy.  After placement of the plates the fracture maintained its reduction which was acceptable.  A Freer elevator was used to while retracting the meniscus proximally to visualize the joint line which was near anatomically reduced.  There was no notable area of chondral step-off.  Then I inspected the meniscus and there was a tear in the posterior horn.  This was sharply debrided with a 15 blade and Adson pickups completing the partial meniscectomy portion of the case.  Then using a 0 Ethibond stitch I repaired the meniscus of the capsule back down to the tibia.  Then I repaired the iliotibial track with the portion of Gertie's tubercle back with 0 Ethibond overlying the plate.  Then the remaining soft tissue was closed over the plate.  I left the fascia open.  The deep tissue was closed with 0 Vicryl.  The subcutaneous tissue with 2-0 Monocryl in the skin with 2-0 nylon.  Counts were correct at the end of the case. No complications. Soft dressing was placed and a knee immobilizer.  Post Op Instructions: Strict nonweightbearing left lower extremity Maintain knee immobilizer at all times Lovenox x2 weeks once discharged for DVT prophylaxis Follow-up in 2 weeks for x-rays and wound check.  Tourniquette Time: 2 hours and 7 minutes  Estimated Blood Loss:  73ml         Drains: none  Blood Given: none         Specimens: none       Complications:  * No complications entered in OR log *         Disposition: PACU - hemodynamically stable.         Condition: stable

## 2018-09-13 NOTE — Anesthesia Procedure Notes (Signed)
Spinal  Patient location during procedure: OR Start time: 09/13/2018 7:36 AM End time: 09/13/2018 7:52 AM Staffing Anesthesiologist: Barnet Glasgow, MD Performed: anesthesiologist  Preanesthetic Checklist Completed: patient identified, surgical consent, pre-op evaluation, timeout performed, IV checked, risks and benefits discussed and monitors and equipment checked Spinal Block Patient position: sitting Prep: site prepped and draped and DuraPrep Patient monitoring: heart rate, cardiac monitor, continuous pulse ox and blood pressure Approach: left paramedian Location: L4-5 Injection technique: single-shot Needle Needle type: Pencan  Needle gauge: 24 G Needle length: 10 cm Needle insertion depth: 6 cm Assessment Sensory level: T4 Additional Notes 4 attempts, pt tolerated procedure in pain.

## 2018-09-13 NOTE — Brief Op Note (Signed)
09/13/2018  11:09 AM  PATIENT:  Drue Stager Caras  77 y.o. male  PRE-OPERATIVE DIAGNOSIS:  LEFT LATERAL TIBIAL PLATEAU NON UNION  POST-OPERATIVE DIAGNOSIS:   LEFT LATERAL TIBIAL PLATEAU NON UNION Left posterior horn lateral meniscal tear  PROCEDURE:  Procedure(s): OPEN TREATMENT OF LEFT TIBIAL PLATEAU REPAIR, NONUNION TIBIA REPAIR PARTIAL MENISECTOMY (Left)  SURGEON:  Surgeon(s) and Role:    Erle Crocker, MD - Primary  PHYSICIAN ASSISTANT: none  ASSISTANTS: Josh RNFA   ANESTHESIA:   spinal  EBL:  25 mL   BLOOD ADMINISTERED:none  DRAINS: none   LOCAL MEDICATIONS USED:  NONE  SPECIMEN:  No Specimen  DISPOSITION OF SPECIMEN:  [name]  COUNTS:  YES  TOURNIQUET:   Total Tourniquet Time Documented: Thigh (Left) - 127 minutes Total: Thigh (Left) - 127 minutes   DICTATION: .Viviann Spare Dictation  PLAN OF CARE: Admit for overnight observation  PATIENT DISPOSITION:  PACU - hemodynamically stable.   Delay start of Pharmacological VTE agent (>24hrs) due to surgical blood loss or risk of bleeding: no

## 2018-09-14 ENCOUNTER — Telehealth: Payer: Self-pay | Admitting: Internal Medicine

## 2018-09-14 ENCOUNTER — Other Ambulatory Visit: Payer: Self-pay

## 2018-09-14 ENCOUNTER — Encounter (HOSPITAL_COMMUNITY): Payer: Self-pay | Admitting: Internal Medicine

## 2018-09-14 DIAGNOSIS — N170 Acute kidney failure with tubular necrosis: Secondary | ICD-10-CM | POA: Diagnosis not present

## 2018-09-14 DIAGNOSIS — E1121 Type 2 diabetes mellitus with diabetic nephropathy: Secondary | ICD-10-CM

## 2018-09-14 DIAGNOSIS — S82142A Displaced bicondylar fracture of left tibia, initial encounter for closed fracture: Secondary | ICD-10-CM | POA: Diagnosis not present

## 2018-09-14 DIAGNOSIS — G4733 Obstructive sleep apnea (adult) (pediatric): Secondary | ICD-10-CM | POA: Diagnosis not present

## 2018-09-14 DIAGNOSIS — N182 Chronic kidney disease, stage 2 (mild): Secondary | ICD-10-CM

## 2018-09-14 DIAGNOSIS — N179 Acute kidney failure, unspecified: Secondary | ICD-10-CM | POA: Diagnosis present

## 2018-09-14 DIAGNOSIS — I10 Essential (primary) hypertension: Secondary | ICD-10-CM

## 2018-09-14 DIAGNOSIS — S82142K Displaced bicondylar fracture of left tibia, subsequent encounter for closed fracture with nonunion: Secondary | ICD-10-CM

## 2018-09-14 LAB — BASIC METABOLIC PANEL
Anion gap: 9 (ref 5–15)
BUN: 26 mg/dL — ABNORMAL HIGH (ref 8–23)
CO2: 24 mmol/L (ref 22–32)
Calcium: 8.8 mg/dL — ABNORMAL LOW (ref 8.9–10.3)
Chloride: 99 mmol/L (ref 98–111)
Creatinine, Ser: 1.85 mg/dL — ABNORMAL HIGH (ref 0.61–1.24)
GFR calc Af Amer: 39 mL/min — ABNORMAL LOW (ref >60)
GFR calc non Af Amer: 34 mL/min — ABNORMAL LOW (ref >60)
Glucose, Bld: 204 mg/dL — ABNORMAL HIGH (ref 70–99)
Potassium: 4.2 mmol/L (ref 3.5–5.1)
Sodium: 132 mmol/L — ABNORMAL LOW (ref 135–145)

## 2018-09-14 LAB — GLUCOSE, CAPILLARY
Glucose-Capillary: 177 mg/dL — ABNORMAL HIGH (ref 70–99)
Glucose-Capillary: 185 mg/dL — ABNORMAL HIGH (ref 70–99)
Glucose-Capillary: 160 mg/dL — ABNORMAL HIGH (ref 70–99)
Glucose-Capillary: 140 mg/dL — ABNORMAL HIGH (ref 70–99)

## 2018-09-14 MED ORDER — METOPROLOL SUCCINATE ER 25 MG PO TB24
25.0000 mg | ORAL_TABLET | Freq: Every day | ORAL | Status: DC
  Filled 2018-09-14: qty 1

## 2018-09-14 MED ORDER — MONTELUKAST SODIUM 10 MG PO TABS
10.0000 mg | ORAL_TABLET | Freq: Every day | ORAL | Status: DC
  Administered 2018-09-14 – 2018-09-17 (×4): 10 mg via ORAL
  Filled 2018-09-14 (×4): qty 1

## 2018-09-14 MED ORDER — LACTATED RINGERS IV SOLN
INTRAVENOUS | Status: DC
  Administered 2018-09-14: 13:00:00 via INTRAVENOUS

## 2018-09-14 MED ORDER — INSULIN ASPART 100 UNIT/ML ~~LOC~~ SOLN: 0.0000 [IU] | Freq: Every day | SUBCUTANEOUS | Status: DC

## 2018-09-14 MED ORDER — INSULIN ASPART 100 UNIT/ML ~~LOC~~ SOLN
0.0000 [IU] | Freq: Three times a day (TID) | SUBCUTANEOUS | Status: DC
  Administered 2018-09-14 – 2018-09-15 (×3): 3 [IU] via SUBCUTANEOUS
  Administered 2018-09-15: 2 [IU] via SUBCUTANEOUS
  Administered 2018-09-15 – 2018-09-16 (×2): 3 [IU] via SUBCUTANEOUS
  Administered 2018-09-16: 2 [IU] via SUBCUTANEOUS
  Administered 2018-09-16 – 2018-09-18 (×6): 3 [IU] via SUBCUTANEOUS

## 2018-09-14 MED ORDER — PANTOPRAZOLE SODIUM 40 MG PO TBEC
40.0000 mg | DELAYED_RELEASE_TABLET | Freq: Every day | ORAL | Status: DC
  Administered 2018-09-15 – 2018-09-18 (×4): 40 mg via ORAL
  Filled 2018-09-14 (×4): qty 1

## 2018-09-14 MED ORDER — ASPIRIN EC 81 MG PO TBEC
81.0000 mg | DELAYED_RELEASE_TABLET | Freq: Every day | ORAL | Status: DC
  Administered 2018-09-14 – 2018-09-18 (×5): 81 mg via ORAL
  Filled 2018-09-14 (×4): qty 1

## 2018-09-14 MED ORDER — LORATADINE 10 MG PO TABS
10.0000 mg | ORAL_TABLET | Freq: Every day | ORAL | Status: DC
  Administered 2018-09-14 – 2018-09-18 (×5): 10 mg via ORAL
  Filled 2018-09-14 (×4): qty 1
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
## 2018-09-14 NOTE — Progress Notes (Signed)
Subjective: 1 Day Post-Op Procedure(s) (LRB): OPEN TREATMENT OF LEFT TIBIAL PLATEAU REPAIR, NONUNION TIBIA REPAIR PARTIAL MENISECTOMY (Left) He did well overnight.  Had some desaturations due to his underlying sleep apnea.  He does have a CPAP machine but he has not been wearing it.  His nephew is bringing it in.  He also had high blood sugar this morning.  His pain is controlled with regard to his left knee.  Denies any shortness of breath or chest pain.  Objective: Vital signs in last 24 hours: Temp:  [97.5 F (36.4 C)-98 F (36.7 C)] 97.7 F (36.5 C) (10/15 0205) Pulse Rate:  [72-95] 95 (10/15 0205) Resp:  [15-22] 22 (10/15 0205) BP: (100-152)/(55-141) 152/141 (10/15 0205) SpO2:  [92 %-96 %] 93 % (10/15 0820)  Recent Labs    09/13/18 1241  HGB 13.3   Recent Labs    09/13/18 1241  WBC 6.6  RBC 4.63  HCT 42.6  PLT 184   Recent Labs    09/13/18 1241  CREATININE 1.15    Awake and alert Left lower extremity and knee immobilizer.  This is opened up.  Dressing is intact without any drainage.  I reapplied the knee immobilizer.  Patient has intact sensation the dorsal plantar surface of the left foot.  Palpable dorsalis pedis pulse.  He is able to wiggle his toes.   Assessment/Plan: 1 Day Post-Op Procedure(s) (LRB): OPEN TREATMENT OF LEFT TIBIAL PLATEAU REPAIR, NONUNION TIBIA REPAIR PARTIAL MENISECTOMY (Left)  Patient is doing well postoperative day 1 from left tibial plateau ORIF and repair of nonunion.  He will work with physical therapy this morning and will remain nonweightbearing for a total of 6 weeks. I will consult the internal medicine team for diabetes control and to discuss his sleep apnea. Patient okay for discharge home once cleared by medical team and physical therapy. Continue Lovenox for DVT prophylaxis and he will be discharged on this. Continue pain control.    Erle Crocker 09/14/2018, 8:48 AM

## 2018-09-14 NOTE — Clinical Social Work Note (Signed)
Clinical Social Work Assessment  Patient Details  Name: [name] MRN: [id number] Date of Birth: 04/16/1941  Date of referral:  09/14/18               Reason for consult:  Discharge Planning                Permission sought to share information with:  Case Manager, Facility Sport and exercise psychologist, Family Supports Permission granted to share information::  Yes, Verbal Permission Granted  Name::     Ronalee Belts  Agency::  SNFs  Relationship::  son  Contact Information:  [phone]  Housing/Transportation Living arrangements for the past 2 months:  [place] of Information:  Patient Patient Interpreter Needed:  None Criminal Activity/Legal Involvement Pertinent to Current Situation/Hospitalization:  No - Comment as needed Significant Relationships:  Adult Children Lives with:  Self Do you feel safe going back to the place where you live?  No Need for family participation in patient care:  Yes (Comment)  Care giving concerns:  CSW received referral for possible SNF placement at time of discharge. Spoke with patient regarding possibility of SNF placement . Patient's son is currently unable to care for him at their home given patient's current needs and fall risk.  Patient and son   expressed understanding of PT recommendation and are agreeable to SNF placement at time of discharge. CSW to continue to follow and assist with discharge planning needs.     Social Worker assessment / plan:  Spoke with patient and   son  concerning possibility of rehab at [facility] before returning home.    Employment status:  Retired Nurse, adult PT Recommendations:  [place] / Referral to community resources:  [place]  Patient/Family's Response to care:  Patient and  son recognize need for rehab before returning home and are agreeable to a SNF in [place]. They report preference for [facility], [facility] or [facility]     . CSW explained insurance authorization process. Patient's family reported that they want patient to get stronger to be able to come back home.    Patient/Family's Understanding of and Emotional Response to Diagnosis, Current Treatment, and Prognosis:  Patient/family is realistic regarding therapy needs and expressed being hopeful for SNF placement. Patient expressed understanding of CSW role and discharge process as well as medical condition. No questions/concerns about plan or treatment.    Emotional Assessment Appearance:  Appears stated age Attitude/Demeanor/Rapport:  Gracious, Self-Confident Affect (typically observed):  Accepting, Adaptable Orientation:  Oriented to Self, Oriented to Place, Oriented to  Time, Oriented to Situation Alcohol / Substance use:  Not Applicable Psych involvement (Current and /or in the community):  No (Comment)  Discharge Needs  Concerns to be addressed:  Discharge Planning Concerns Readmission within the last 30 days:  No Current discharge risk:  Dependent with Mobility Barriers to Discharge:  Continued Medical Work up   [employer], [place] 09/14/2018, 2:58 PM

## 2018-09-14 NOTE — NC FL2 (Signed)
[place] LEVEL OF CARE SCREENING TOOL     IDENTIFICATION  Patient Name: [name] Birthdate: 20-Jun-1941 Sex: male Admission Date (Current Location): 09/13/2018  [facility] and [place] Number:  Herbalist and Address:  [facility] [place], [place] [address] [place], [place] [address]      Provider Number: [id number]  Attending Physician Name and Address:  Erle Crocker, MD  Relative Name and Phone Number:  Ronalee Belts (son) [phone]    Current Level of Care: Hospital Recommended Level of Care: [place] Prior Approval Number:    Date Approved/Denied:   PASRR Number: [id number]  Discharge Plan: SNF    Current Diagnoses: Patient Active Problem List   Diagnosis Date Noted  . Acute renal failure superimposed on stage 2 chronic kidney disease ([place]) 09/14/2018  . Left tibial fracture 09/13/2018  . Degenerative arthritis of left knee 08/05/2018  . Baker's cyst, left 08/05/2018  . Tibial plateau fracture, left 08/05/2018  . Tear of MCL (medial collateral ligament) of knee, left, initial encounter 05/31/2018  . Left carpal tunnel syndrome 04/13/2018  . Grief at loss of child 11/20/2017  . Near syncope 11/12/2017  . Chronic kidney disease (CKD), stage II (mild) 11/10/2017  . GERD (gastroesophageal reflux disease) 11/10/2017  . Difficulty speaking 11/10/2017  . Hypothermia 11/10/2017  . Biceps tendon tear 07/13/2017  . Degenerative disc disease, lumbar 11/26/2016  . Cyst of maxillary sinus 11/14/2016  . Upper airway cough syndrome 11/13/2016  . Right rotator cuff tear 12/20/2015  . Low back pain 12/20/2015  . Left knee pain 12/20/2015  . Pleural effusion, left 02/21/2013  . 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 S/P Maze operation for atrial fibrillation 01/27/2013  . Type II diabetes mellitus with renal manifestations ([place]) 01/19/2013  . Hyperlipidemia 01/19/2013  . Morbid obesity due to excess calories ([place])  01/19/2013  . Hypertension 01/19/2013  . OSA (obstructive sleep apnea) 12/10/2012  . Coronary atherosclerosis of native coronary artery 12/09/2012  . Cardiomyopathy, ischemic 12/09/2012  . Atrial fibrillation ([place]) 10/25/2012    Orientation RESPIRATION BLADDER Height & Weight     Self, Time, Situation, Place  Normal Incontinent, Indwelling catheter(foley) Weight: 242 lb (109.8 kg) Height:  5\' 11"  (180.3 cm)  BEHAVIORAL SYMPTOMS/MOOD NEUROLOGICAL BOWEL NUTRITION STATUS      Continent Diet(see discharge summary)  AMBULATORY STATUS COMMUNICATION OF NEEDS Skin   Limited Assist Verbally Surgical wounds(left kneed closed surgical incision)                       Personal Care Assistance Level of Assistance  Bathing, Feeding, Dressing, Total care Bathing Assistance: Limited assistance Feeding assistance: Independent Dressing Assistance: Limited assistance Total Care Assistance: Limited assistance   Functional Limitations Info  Sight, Hearing, Speech Sight Info: Adequate Hearing Info: Impaired Speech Info: Adequate    SPECIAL CARE FACTORS FREQUENCY  PT (By licensed PT), OT (By licensed OT)     PT Frequency: 5x weekly OT Frequency: 5x weekly            Contractures Contractures Info: Not present    Additional Factors Info  Allergies   Allergies Info: Allergies:  Asa Aspirin, Statins           Current Medications (09/14/2018):  This is the current hospital active medication list Current Facility-Administered Medications  Medication Dose Route Frequency Provider Last Rate Last Dose  . aspirin EC tablet 81 mg  81 mg Oral Daily Yates,  Anderson Malta, MD   81 mg at 09/14/18 1246  . docusate sodium (COLACE) capsule 100 mg  100 mg Oral BID Erle Crocker, MD   100 mg at 09/14/18 0824  . enoxaparin (LOVENOX) injection 40 mg  40 mg Subcutaneous Q24H Erle Crocker, MD   40 mg at 09/14/18 0820  . gabapentin (NEURONTIN) capsule 300 mg  300 mg Oral TID Erle Crocker, MD   300 mg at 09/14/18 0824  . HYDROmorphone (DILAUDID) injection 0.5-1 mg  0.5-1 mg Intravenous Q4H PRN Erle Crocker, MD   1 mg at 09/14/18 0105  . insulin aspart (novoLOG) injection 0-15 Units  0-15 Units Subcutaneous TID WC Karmen Bongo, MD   3 Units at 09/14/18 1245  . insulin aspart (novoLOG) injection 0-5 Units  0-5 Units Subcutaneous QHS Karmen Bongo, MD      . lactated ringers infusion   Intravenous Continuous Karmen Bongo, MD 100 mL/hr at 09/14/18 1300    . loratadine (CLARITIN) tablet 10 mg  10 mg Oral Daily Karmen Bongo, MD   10 mg at 09/14/18 1246  . metoCLOPramide (REGLAN) tablet 5-10 mg  5-10 mg Oral Q8H PRN Erle Crocker, MD       Or  . metoCLOPramide (REGLAN) injection 5-10 mg  5-10 mg Intravenous Q8H PRN Erle Crocker, MD      . metoprolol succinate (TOPROL-XL) 24 hr tablet 25 mg  25 mg Oral Daily Karmen Bongo, MD      . montelukast (SINGULAIR) tablet 10 mg  10 mg Oral Ivery Quale, MD      . ondansetron [facility]) tablet 4 mg  4 mg Oral Q6H PRN Erle Crocker, MD       Or  . ondansetron [facility]) injection 4 mg  4 mg Intravenous Q6H PRN Erle Crocker, MD      . oxyCODONE (Oxy IR/ROXICODONE) immediate release tablet 10-15 mg  10-15 mg Oral Q4H PRN Erle Crocker, MD   15 mg at 09/14/18 0820  . oxyCODONE (Oxy IR/ROXICODONE) immediate release tablet 5-10 mg  5-10 mg Oral Q4H PRN Erle Crocker, MD   10 mg at 09/14/18 1247  . [START ON 09/15/2018] pantoprazole (PROTONIX) EC tablet 40 mg  40 mg Oral QAC breakfast Karmen Bongo, MD         Discharge Medications: Please see discharge summary for a list of discharge medications.  Relevant Imaging Results:  Relevant Lab Results:   Additional Information SSN: [id number]  Alberteen Sam, LCSW

## 2018-09-14 NOTE — Plan of Care (Signed)

## 2018-09-14 NOTE — Evaluation (Signed)
Physical Therapy Evaluation Patient Details Name: [name] MRN: [id number] DOB: Dec 14, 1940 Today's Date: 09/14/2018   History of Present Illness  Pt is a 77 yo male who on July 1 he sustained an injury to his left knee.  He had an undiagnosed lateral tibial plateau fracture.  He is been walking on it.  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 Pt currently limited in safe mobility by L knee pain, as well as generalized weakness in bilateral UE and R LE such that he fatigues very quickly when hopping on RLE with bilateral support of UE on RW. Pt currently requires mod A for bed mobility, minA for sit<>stand transfers and mod A for hopping 3 feet from bed to recliner. PT recommends SNF level rehab at discharge for strengthening, DME training and stair training so that he can safely get in and out of his home once he eventually goes home. PT will continue to follow acutely.     Follow Up Recommendations SNF    Equipment Recommendations  None recommended by PT    Recommendations for Other Services       Precautions / Restrictions Precautions Precautions: None Required Braces or Orthoses: Knee Immobilizer - Left Knee Immobilizer - Left: On at all times Restrictions Weight Bearing Restrictions: Yes LLE Weight Bearing: Non weight bearing      Mobility  Bed Mobility Overal bed mobility: Needs Assistance Bed Mobility: Supine to Sit     Supine to sit: Mod assist     General bed mobility comments: mod A for trunk to upright and management of L LE off of bed  Transfers Overall transfer level: Needs assistance Equipment used: Rolling walker (2 wheeled) Transfers: Sit to/from Stand Sit to Stand: From elevated surface;Min assist         General transfer comment: minA for  steadying at RW, vc for hand placement for power up and L LE out in front off of floor  Ambulation/Gait Ambulation/Gait assistance: Mod assist Gait Distance (Feet): 3 Feet Assistive device: Rolling walker (2 wheeled) Gait Pattern/deviations: Trunk flexed;Decreased step length - right(hop to pattern ) Gait velocity: slowed Gait velocity interpretation: <1.31 ft/sec, indicative of household ambulator General Gait Details: modA for steadying with RW, vc for hop to pattern, increased UE usage on RW, and pivoting on R LE      Balance Overall balance assessment: Needs assistance Sitting-balance support: Single extremity supported(R LE support ) Sitting balance-Leahy Scale: Fair     Standing balance support: Bilateral upper extremity supported Standing balance-Leahy Scale: Poor Standing balance comment: requires Rw support to maintain NWB and balance                             Pertinent Vitals/Pain Pain Assessment: 0-10 Pain Score: 2  Pain Location: knee Pain Descriptors / Indicators: Aching Pain Intervention(s): Limited activity within patient's tolerance;Repositioned;Monitored during session    Home Living Family/patient expects to be discharged to:: Private residence Living Arrangements: Children;Other relatives Available Help at Discharge: Family;Available 24 hours/day Type of Home: House Home Access: Stairs to enter Entrance Stairs-Rails: Right Entrance Stairs-Number of Steps: 4 Home Layout: Multi-level Home Equipment: Shower seat - built in;Walker - 2 wheels      Prior Function Level of Independence: Independent  Comments: 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 Independent, Drives.         Extremity/Trunk Assessment   Upper Extremity Assessment Upper Extremity Assessment: Overall WFL for tasks assessed    Lower Extremity Assessment Lower Extremity Assessment: RLE deficits/detail;LLE  deficits/detail RLE Deficits / Details: tibial plateau fx repair, hip strength grossly 3+/5 able to lift LE to slide across bed RLE: Unable to fully assess due to immobilization LLE Deficits / Details: ROM WFL, strength grossly 4/5        Communication   Communication: No difficulties  Cognition Arousal/Alertness: Awake/alert Behavior During Therapy: WFL for tasks assessed/performed Overall Cognitive Status: Within Functional Limits for tasks assessed                                        General Comments General comments (skin integrity, edema, etc.): SaO2 on RA >93%        Assessment/Plan    PT Assessment Patient needs continued PT services  PT Problem List Decreased strength;Decreased activity tolerance;Decreased balance;Decreased mobility;Decreased knowledge of use of DME;Pain;Decreased range of motion       PT Treatment Interventions DME instruction;Gait training;Stair training;Functional mobility training;Therapeutic activities;Therapeutic exercise;Balance training;Cognitive remediation    PT Goals (Current goals can be found in the Care Plan section)  Acute Rehab PT Goals Patient 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 : Unable Difficulty sitting down on and standing up from a chair with arms (e.g., wheelchair, bedside commode, etc,.)?: Unable Help needed moving to and from a bed to chair (including a wheelchair)?: A Lot Help needed walking in hospital room?: A Lot Help needed climbing 3-5 steps with a railing? : Total 6 Click  Score: 10    End of Session Equipment Utilized During Treatment: Gait belt Activity Tolerance: Patient limited by fatigue Patient left: in chair;with call bell/phone within reach;with family/visitor present Nurse Communication: Mobility status;Weight bearing status PT Visit Diagnosis: Unsteadiness on feet (R26.81);Muscle weakness (generalized) (M62.81);Other abnormalities of gait and mobility (R26.89);Difficulty in walking, not elsewhere classified (R26.2);Pain Pain - Right/Left: Left Pain - part of body: Knee    Time: 1021-1105 PT Time Calculation (min) (ACUTE ONLY): 44 min   Charges:   PT Evaluation $PT Eval Moderate Complexity: 1 Mod PT Treatments $Gait Training: 8-22 mins $Therapeutic Activity: 8-22 mins        Qunisha Bryk B. Migdalia Dk PT, DPT Acute Rehabilitation Services Pager [phone] Office [phone]   [place] 09/14/2018, 11:49 AM

## 2018-09-14 NOTE — Progress Notes (Signed)
On vitals check for daily lopressor dose, patient HR irregular and dropping to 47, BP stable. MD Lorin Mercy made aware, lopressor d/c'd. Will continue to monitor.

## 2018-09-14 NOTE — Telephone Encounter (Signed)
Spoke with pt, advised him that the last setting from Dr. Gwenette Greet was at 14cm but then switched to auto. I advised him to call [facility] because they would have the exact numbers that his machine is set on. Pt understood and will confirm with DME company. Nothing further is needed.

## 2018-09-15 DIAGNOSIS — S82142A Displaced bicondylar fracture of left tibia, initial encounter for closed fracture: Secondary | ICD-10-CM | POA: Diagnosis not present

## 2018-09-15 LAB — BASIC METABOLIC PANEL
Anion gap: 10 (ref 5–15)
BUN: 18 mg/dL (ref 8–23)
CO2: 25 mmol/L (ref 22–32)
Calcium: 8.8 mg/dL — ABNORMAL LOW (ref 8.9–10.3)
Chloride: 100 mmol/L (ref 98–111)
Creatinine, Ser: 1.2 mg/dL (ref 0.61–1.24)
GFR calc Af Amer: >60 mL/min (ref >60)
GFR calc non Af Amer: 57 mL/min — ABNORMAL LOW (ref >60)
Glucose, Bld: 158 mg/dL — ABNORMAL HIGH (ref 70–99)
Potassium: 4.3 mmol/L (ref 3.5–5.1)
Sodium: 135 mmol/L (ref 135–145)

## 2018-09-15 LAB — GLUCOSE, CAPILLARY
Glucose-Capillary: 150 mg/dL — ABNORMAL HIGH (ref 70–99)
Glucose-Capillary: 193 mg/dL — ABNORMAL HIGH (ref 70–99)
Glucose-Capillary: 170 mg/dL — ABNORMAL HIGH (ref 70–99)
Glucose-Capillary: 182 mg/dL — ABNORMAL HIGH (ref 70–99)

## 2018-09-15 NOTE — Progress Notes (Signed)
Subjective: 2 Days Post-Op Procedure(s) (LRB): OPEN TREATMENT OF LEFT TIBIAL PLATEAU REPAIR, NONUNION TIBIA REPAIR PARTIAL MENISECTOMY (Left) Doing well this am. Pain controlled. Worked with PT and they are recommending SNF.  No SOB.  Objective: Vital signs in last 24 hours: Temp:  [98.1 F (36.7 C)-98.6 F (37 C)] 98.1 F (36.7 C) (10/16 0450) Pulse Rate:  [47-103] 101 (10/16 0450) Resp:  [16-20] 20 (10/16 0450) BP: (151-159)/(58-88) 159/88 (10/16 0450) SpO2:  [93 %-100 %] 100 % (10/16 0450)   Awake and alert.  Knee immobilizer in place.  Some edema to lower leg and foot.  SILT over foot.  Motor intact foot.  WWP.    Assessment/Plan: 2 Days Post-Op Procedure(s) (LRB): OPEN TREATMENT OF LEFT TIBIAL PLATEAU REPAIR, NONUNION TIBIA REPAIR PARTIAL MENISECTOMY (Left)  Doing well.   Will plan for SNF placement.   Continue lovenox inpatient and for 2 weeks after.  NWB LLE Continue KI at all times.  Do not change dressing.  Pain control  DM and OSA management per medicne - appreciate assistance.      Erle Crocker 09/15/2018, 8:24 AM

## 2018-09-15 NOTE — Plan of Care (Signed)

## 2018-09-16 DIAGNOSIS — S82142A Displaced bicondylar fracture of left tibia, initial encounter for closed fracture: Secondary | ICD-10-CM | POA: Diagnosis not present

## 2018-09-16 LAB — GLUCOSE, CAPILLARY
Glucose-Capillary: 165 mg/dL — ABNORMAL HIGH (ref 70–99)
Glucose-Capillary: 146 mg/dL — ABNORMAL HIGH (ref 70–99)
Glucose-Capillary: 173 mg/dL — ABNORMAL HIGH (ref 70–99)
Glucose-Capillary: 161 mg/dL — ABNORMAL HIGH (ref 70–99)

## 2018-09-16 NOTE — Plan of Care (Signed)

## 2018-09-16 NOTE — Progress Notes (Signed)
Subjective: 3 Days Post-Op Procedure(s) (LRB): OPEN TREATMENT OF LEFT TIBIAL PLATEAU REPAIR, NONUNION TIBIA REPAIR PARTIAL MENISECTOMY (Left) Doing well.  Pain controlled.  Hoping to discharge tomorrow.   Objective: Vital signs in last 24 hours: Temp:  [98.1 F (36.7 C)-98.7 F (37.1 C)] 98.1 F (36.7 C) (10/17 0507) Pulse Rate:  [46-99] 46 (10/17 0507) Resp:  [18-22] 18 (10/16 2159) BP: (148-152)/(74-89) 148/89 (10/17 0507) SpO2:  [91 %-98 %] 98 % (10/17 0507)    Awake and alert. KI in place. Swelling to ankle and foot.  SILT foot.  Wiggles toes.     Assessment/Plan: 3 Days Post-Op Procedure(s) (LRB): OPEN TREATMENT OF LEFT TIBIAL PLATEAU REPAIR, NONUNION TIBIA REPAIR PARTIAL MENISECTOMY (Left)  Planning for discharge to SNF once approved per PT recs.  Continue PT Pain control Lovenox for DVT prophylaxis Out of bed with assistance. Keep knee immobilizer in place.  Post op abx completed.    Will follow up in two weeks from surgery.     Erle Crocker 09/16/2018, 8:04 AM

## 2018-09-16 NOTE — Progress Notes (Signed)
RT came to place patient on CPAP HS. Patient is already on CPAP. Patient tolerating well.

## 2018-09-16 NOTE — Progress Notes (Signed)
Physical Therapy Treatment Patient Details Name: [name] MRN: [id number] DOB: 12-Feb-1941 Today's Date: 09/16/2018    History of Present Illness Pt is a 77 yo male who on July 1 he sustained an injury to his left knee.  He had an undiagnosed lateral tibial plateau fracture.  He is been walking on it.  s/p 10/14 tibial plateau ORIF PMH: DM, CAD, HTN, sleep apnea    PT Comments    Pt anxious to work with therapy today. Pt is making good progress towards his goals and has been able to progress his ambulation distance. Pt is limited in safe mobility by decreased safety awareness as far as working through the pain, which puts him at risk for other orthopedic injury. Pt educated on need to only ambulate distance that do not illicit increased shoulder or R knee pain. Pt requires minA for steadying with ambulation. Pt will benefit from more functional therapy that is offered in the SNF as soon as possible. PT will continue to follow acutely.    Follow Up Recommendations  SNF     Equipment Recommendations  None recommended by PT    Recommendations for Other Services       Precautions / Restrictions Precautions Precautions: None Required Braces or Orthoses: Knee Immobilizer - Left Knee Immobilizer - Left: On at all times Restrictions Weight Bearing Restrictions: Yes LLE Weight Bearing: Non weight bearing    Mobility  Bed Mobility               General bed mobility comments: OOB in recliner on entry   Transfers Overall transfer level: Needs assistance Equipment used: Rolling walker (2 wheeled) Transfers: Sit to/from Stand Sit to Stand: From elevated surface;Min assist         General transfer comment: minA for steadying at RW, vc for hand placement for power up and L LE out in front off of floor  Ambulation/Gait Ambulation/Gait assistance: Min assist Gait Distance (Feet): 50 Feet Assistive device: Rolling walker (2 wheeled) Gait Pattern/deviations: Trunk  flexed;Decreased step length - right(hop to pattern ) Gait velocity: slowed Gait velocity interpretation: <1.8 ft/sec, indicate of risk for recurrent falls General Gait Details: min A for steadying with RW, vc for use of UE and decreased force on RLE with hopping to protect joints       Balance Overall balance assessment: Needs assistance Sitting-balance support: Single extremity supported(R LE support ) Sitting balance-Leahy Scale: Fair     Standing balance support: Bilateral upper extremity supported Standing balance-Leahy Scale: Poor Standing balance comment: requires Rw support to maintain NWB and balance                            Cognition Arousal/Alertness: Awake/alert Behavior During Therapy: WFL for tasks assessed/performed Overall Cognitive Status: Within Functional Limits for tasks assessed                                        Exercises General Exercises - Upper Extremity Chair Push Up: AROM;Both;10 reps;Seated    General Comments General comments (skin integrity, edema, etc.): Pt educated on at least 8 week bone healing process, and the need to be aware of joint pain in shoulders and R knee as an indicator to reduce mobility in order to not create more orthopedic injuries during rehabing his knee.  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 : Unable Difficulty sitting down on and standing up from a chair with arms (e.g., wheelchair, bedside commode, etc,.)?: Unable Help needed moving to and from a bed to chair (including a wheelchair)?: A Lot Help needed walking in hospital room?: A Lot Help needed climbing 3-5 steps with a railing? : Total 6 Click Score: 10    End of Session Equipment Utilized During Treatment: Gait belt Activity Tolerance: Patient limited by fatigue Patient left: in chair;with call bell/phone within reach;with family/visitor present Nurse Communication: Mobility status;Weight bearing status PT Visit Diagnosis: Unsteadiness on feet (R26.81);Muscle weakness (generalized) (M62.81);Other abnormalities of gait and mobility (R26.89);Difficulty in walking, not elsewhere classified (R26.2);Pain Pain - Right/Left: Left Pain - part of body: Knee     Time: 0086-7619 PT Time Calculation (min) (ACUTE ONLY): 44 min  Charges:  $Gait Training: 8-22 mins $Therapeutic Exercise: 8-22 mins $Therapeutic Activity: 8-22 mins                     Jasaun Carn B. Migdalia Dk PT, DPT Acute Rehabilitation Services Pager [phone] Office [phone]    [place] 09/16/2018, 5:28 PM

## 2018-09-16 NOTE — Progress Notes (Signed)
CSW consulted [facility] regarding update on [employer], Ronney Lion reports no authorization at this time.   CSW will continue to follow up.   [place], [place]

## 2018-09-17 DIAGNOSIS — S82142A Displaced bicondylar fracture of left tibia, initial encounter for closed fracture: Secondary | ICD-10-CM | POA: Diagnosis not present

## 2018-09-17 LAB — GLUCOSE, CAPILLARY
Glucose-Capillary: 170 mg/dL — ABNORMAL HIGH (ref 70–99)
Glucose-Capillary: 184 mg/dL — ABNORMAL HIGH (ref 70–99)
Glucose-Capillary: 164 mg/dL — ABNORMAL HIGH (ref 70–99)
Glucose-Capillary: 145 mg/dL — ABNORMAL HIGH (ref 70–99)

## 2018-09-17 MED ORDER — ONDANSETRON HCL 4 MG PO TABS: 4.0000 mg | ORAL_TABLET | Freq: Four times a day (QID) | ORAL | 0 refills | Status: DC | PRN

## 2018-09-17 MED ORDER — DOCUSATE SODIUM 100 MG PO CAPS: 100.0000 mg | ORAL_CAPSULE | Freq: Two times a day (BID) | ORAL | 0 refills | Status: DC

## 2018-09-17 MED ORDER — OXYCODONE HCL 5 MG PO TABS: 5.0000 mg | ORAL_TABLET | ORAL | 0 refills | Status: DC | PRN

## 2018-09-17 MED ORDER — FLEET ENEMA 7-19 GM/118ML RE ENEM
1.0000 | ENEMA | Freq: Every day | RECTAL | Status: DC | PRN
  Administered 2018-09-17: 1 via RECTAL
  Filled 2018-09-17: qty 1

## 2018-09-17 MED ORDER — ENOXAPARIN SODIUM 40 MG/0.4ML ~~LOC~~ SOLN: 40.0000 mg | SUBCUTANEOUS | 0 refills | Status: DC

## 2018-09-17 NOTE — Progress Notes (Signed)
Physical Therapy Treatment Patient Details Name: [name] MRN: [id number] DOB: Dec 05, 1940 Today's Date: 09/17/2018    History of Present Illness Pt is a 77 yo male who on July 1 he sustained an injury to his left knee.  He had an undiagnosed lateral tibial plateau fracture.  He is been walking on it.  s/p 10/14 tibial plateau ORIF PMH: DM, CAD, HTN, sleep apnea    PT Comments    Patient seen for mobility progression. Pt tolerated session well with no c/o increased pain. Pt requires min guard/min A for functional transfers and gait training. No difficulty noted with maintaining NWB status. Pt educated on self monitoring for needed rest breaks. Pt's grandson present and reports pt would be alone while son/daughter in law work during the day if he is to d/c home. Continue to progress as tolerated with anticipated d/c to SNF for further skilled PT services.    Follow Up Recommendations  SNF     Equipment Recommendations  None recommended by PT    Recommendations for Other Services       Precautions / Restrictions Precautions Precautions: None Required Braces or Orthoses: Knee Immobilizer - Left Knee Immobilizer - Left: On at all times Restrictions Weight Bearing Restrictions: Yes LLE Weight Bearing: Non weight bearing    Mobility  Bed Mobility               General bed mobility comments: OOB in recliner on entry; grandson present and reports that pt needs assistance to get in/out bed  Transfers Overall transfer level: Needs assistance Equipment used: Rolling walker (2 wheeled) Transfers: Sit to/from Stand Sit to Stand: Min assist;Min guard         General transfer comment: assist to steady and stabilize RW; cues for safety as pt began standing without RW nearby  Ambulation/Gait Ambulation/Gait assistance: Min assist;Min guard Gait Distance (Feet): (59ft X2 trials with seated rest break) Assistive device: Rolling walker (2 wheeled) Gait  Pattern/deviations: Trunk flexed;Step-to pattern Gait velocity: slowed   General Gait Details: cues for safe use of AD, posture, and breathing; educated on self monitoring for need of rest break; with fatigue pt begins to push RW too far forward   Stairs             Wheelchair Mobility    Modified Rankin (Stroke Patients Only)       Balance Overall balance assessment: Needs assistance Sitting-balance support: Single extremity supported(R LE support ) Sitting balance-Leahy Scale: Fair     Standing balance support: Bilateral upper extremity supported Standing balance-Leahy Scale: Poor                              Cognition Arousal/Alertness: Awake/alert Behavior During Therapy: WFL for tasks assessed/performed Overall Cognitive Status: Within Functional Limits for tasks assessed                                        Exercises Low Level/ICU Exercises Ankle Circles/Pumps: AROM;Both    General Comments        Pertinent Vitals/Pain Pain Assessment: Faces Faces Pain Scale: Hurts a little bit Pain Location: knee Pain Descriptors / Indicators: Discomfort;Sore Pain Intervention(s): Monitored during session;Repositioned    Home Living                      Prior  Function            PT Goals (current goals can now be found in the care plan section) Progress towards PT goals: Progressing toward goals    Frequency    Min 3X/week      PT Plan Current plan remains appropriate    Co-evaluation              AM-PAC PT "6 Clicks" Daily Activity  Outcome Measure  Difficulty turning over in bed (including adjusting bedclothes, sheets and blankets)?: A Little Difficulty moving from lying on back to sitting on the side of the bed? : Unable Difficulty sitting down on and standing up from a chair with arms (e.g., wheelchair, bedside commode, etc,.)?: Unable Help needed moving to and from a bed to chair (including a  wheelchair)?: A Little Help needed walking in hospital room?: A Little Help needed climbing 3-5 steps with a railing? : Total 6 Click Score: 12    End of Session Equipment Utilized During Treatment: Gait belt Activity Tolerance: Patient tolerated treatment well Patient left: in chair;with call bell/phone within reach;with family/visitor present Nurse Communication: Mobility status PT Visit Diagnosis: Unsteadiness on feet (R26.81);Muscle weakness (generalized) (M62.81);Other abnormalities of gait and mobility (R26.89);Difficulty in walking, not elsewhere classified (R26.2);Pain Pain - Right/Left: Left Pain - part of body: Knee     Time: 1610-9604 PT Time Calculation (min) (ACUTE ONLY): 28 min  Charges:  $Gait Training: 8-22 mins $Therapeutic Activity: 8-22 mins                     [name], PTA Acute Rehabilitation Services Pager: [phone] Office: [phone]     Darliss Cheney 09/17/2018, 11:54 AM

## 2018-09-17 NOTE — Progress Notes (Signed)
CSW updated family of no insurance authorization from [facility] at this point, pending [employer].   Family reports they would like patient to go home with home health and equipment including hospital bed.   CSW notified RNCM.   CSW signing off.   [place], [place] Merle Cirelli

## 2018-09-17 NOTE — Care Management Note (Signed)
Case Management Note  Patient Details  Name: [name] MRN: [id number] Date of Birth: 07/20/1941  Subjective/Objective:    Left tibial plateau fracture                Action/Plan: 1111 am NCM spoke to pt and grandson, [name] at bedside. Pt states if he has not receive auth from insurance for SNF. He wants to dc to his son's house, [name] with [facility]. Requesting full electric bed, wheelchair, and 3n1 bedside commode. Offered choice for [facility].   400 pm [place] for DME for delivery tonight or in am. [facility] for [facility].   Son's address: [name], [address] [place] [address] # [phone]  Expected Discharge Date:                  Expected Discharge Plan:  [place]  In-House Referral:  NA  Discharge planning Services  CM Consult  Post Acute Care Choice:  Home Health Choice offered to:  Patient  DME Arranged:  3-N-1, Lightweight manual wheelchair with seat cushion, Hospital bed DME Agency:  [place]:  PT, OT, Nurse's Aide, Social Work [employer] Agency:  Other - See comment  Status of Service:  Completed, signed off  If discussed at [employer], dates discussed:    Additional Comments:  Erenest Rasher, RN 09/17/2018, 4:21 PM

## 2018-09-17 NOTE — Plan of Care (Signed)
  Problem: Coping: Goal: Level of anxiety will decrease Outcome: Progressing   Problem: Elimination: Goal: Will not experience complications related to urinary retention Outcome: Completed/Met   Problem: Pain Managment: Goal: General experience of comfort will improve Outcome: Progressing   Problem: Safety: Goal: Ability to remain free from injury will improve Outcome: Progressing

## 2018-09-17 NOTE — Discharge Instructions (Signed)
DR. Lucia Gaskins FOOT & ANKLE SURGERY POST-OP INSTRUCTIONS   Pain Management  1. Keep your foot elevated above heart level.  Make sure that your heel hangs free ('floats'). 2. Take all prescribed medication as directed. 3. If taking narcotic pain medication you may want to use an over-the-counter stool softener to avoid constipation. 4. You may take over-the-counter NSAIDs (ibuprofen, naproxen, etc.) as well as over-the-counter acetaminophen as directed on the packaging as a supplement for your pain and may also use it to wean away from the prescription medication.  Activity  ? Non-weightbearing   First Postoperative Visit 1. Your first postop visit will be at least 2 weeks after surgery.  This should be scheduled when you schedule surgery. 2. If you do not have a postoperative visit scheduled please call [phone] to schedule an appointment. 3. At the appointment your incision will be evaluated for suture removal, x-rays will be obtained if necessary.  General Instructions 1. Swelling is very common after foot and ankle surgery.  It often takes 3 months for the foot and ankle to begin to feel comfortable.  Some amount of swelling will persist for 6-12 months. 2. DO NOT change the dressing.  If there is a problem with the dressing (too tight, loose, gets wet, etc.) please contact Dr. Pollie Friar office. 3. DO NOT get the dressing wet.  For showers you can use an over-the-counter cast cover or wrap a washcloth around the top of your dressing and then cover it with a plastic bag and tape it to your leg. 4. DO NOT soak the incision (no tubs, pools, bath, etc.) until you have approval from Dr. Lucia Gaskins.  Contact Dr. Huel Cote office or go to Emergency Room if: 1. Temperature above 101 F. 2. Increasing pain that is unresponsive to pain medication or elevation 3. Excessive redness or swelling in your foot 4. Dressing problems - excessive bloody drainage, looseness or tightness, or if dressing gets  wet 5. Develop pain, swelling, warmth, or discoloration of your calf

## 2018-09-17 NOTE — Discharge Summary (Signed)
Physician Discharge Summary  Patient ID: [name] MRN: [id number] DOB/AGE: August 14, 1941 77 y.o.  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  Patient had adequate pain control however while working with physical therapy and attempting to maintain nonweightbearing on left lower extremity was recommended transfer to a skilled nursing facility.  During his hospital stay he was found to have some desaturation events in the middle of the night and therefore internal medicine was consulted for inspection and calibration of his CPAP machine.  He also had some issues with glucose control and they were asked to help manage this.  He had no further issues after medical evaluation initially.  On the day of discharge she was deemed medically stable for discharge to skilled nursing facility.   Consults: Hospitalist team  Significant Diagnostic Studies: None  Treatments: surgery: Patient underwent open treatment of left tibial plateau fracture and repair of nonunion on 09/13/2018.  Discharge Exam: Blood pressure 129/66, pulse (!) 47, temperature 98.2 F (36.8 C), temperature source Oral, resp. rate 13, height 5' 11" (1.803 m), weight 109.8 kg, SpO2 94 %.    Disposition:  Transfer to skilled nursing facility  Allergies as of 09/17/2018      Reactions   Asa [aspirin] Other (See Comments)   "Only can  tolerate low dose" (specific reaction not recalled) pt itches with ASA greater than 81 mg   Statins Other (See Comments)   Statins cause muscle aches      Medication List    TAKE these medications   aspirin 81 MG EC tablet Take 1 tablet (81 mg total) by mouth daily.   B-12 500 MCG Tabs Take 500 mcg by mouth daily.   cetirizine-pseudoephedrine 5-120 MG tablet Commonly known as:  ZYRTEC-D Take 1 tablet by mouth 2 (two) times daily.   CINNAMON PO Take 2 capsules by mouth daily.   docusate sodium 100 MG capsule Commonly known as:  COLACE Take 1 capsule (100 mg total) by mouth 2 (two) times daily.   enoxaparin 40 MG/0.4ML injection Commonly known as:  LOVENOX Inject 0.4 mLs (40 mg total) into the skin daily for 30 doses.   GLUCOSAMINE 1500 COMPLEX PO Take 1 tablet by mouth daily.   glucose blood test strip Use as instructed   glucose monitoring kit monitoring kit 1 each by Does not apply route as needed for other.   JARDIANCE 10 MG Tabs tablet Generic drug:  empagliflozin Take 10 mg by mouth daily.   losartan 25 MG tablet Commonly known as:  COZAAR Take 1 tablet (25 mg total) by mouth daily.   metFORMIN 500 MG tablet Commonly known as:  GLUCOPHAGE Take 500 mg by mouth 2 (two) times daily with a meal.   metoprolol succinate 25 MG 24 hr tablet Commonly known as:  TOPROL-XL Take 25 mg by mouth daily.   montelukast 10 MG tablet Commonly known as:  SINGULAIR Take 1 tablet (10 mg total) by  mouth at bedtime.   multivitamin with minerals tablet Take 1 tablet by mouth daily.   olopatadine 0.1 % ophthalmic solution Commonly known as:  PATANOL Place 1 drop into both eyes 4 (four) times daily as needed for allergies (or dry eyes).   ondansetron 4 MG tablet Commonly known as:  ZOFRAN Take 1 tablet (4 mg total) by mouth every 6 (six) hours as needed for nausea.   onetouch ultrasoft lancets Use as instructed   oxyCODONE 5 MG immediate release tablet Commonly known  as:  Oxy IR/ROXICODONE Take 1-2 tablets (5-10 mg total) by mouth every 4 (four) hours as needed for moderate pain (pain score 4-6).   pantoprazole 40 MG tablet Commonly known as:  PROTONIX Take 1 tablet (40 mg total) by mouth daily. Take 30-60 min before first meal of the day What changed:  additional instructions   pioglitazone 45 MG tablet Commonly known as:  ACTOS Take 45 mg by mouth daily.   triamcinolone cream 0.1 % Commonly known as:  KENALOG Apply 1 application topically 2 (two) times daily as needed (for irritation).   vitamin C 1000 MG tablet Take 1,000 mg by mouth daily.   Vitamin D-3 1000 units Caps Take 1,000 Units by mouth daily.      Contact information for after-discharge care    Destination    [facility] Preferred SNF .   Service:  Skilled Nursing Contact information: [place] [phone]              Signed: Erle Crocker 09/17/2018, 8:07 AM

## 2018-09-17 NOTE — Progress Notes (Signed)
Patient suffers from left leg fracture and is non weight bearing which impairs their ability to perform daily activities like ambulate and perform ADLs in the home.  A walker alone will not resolve the issues with performing activities of daily living. A wheelchair will allow patient to safely perform daily activities.  The patient can self propel in the home or has a caregiver who can provide assistance.     Earney Navy, PTA Acute Rehabilitation Services Pager: [phone] Office: [phone]

## 2018-09-17 NOTE — Care Management Obs Status (Signed)
[place] NOTIFICATION   Patient Details  Name: MUHAMMAD VACCA MRN: [id number] Date of Birth: Jan 14, 1941   Medicare Observation Status Notification Given:  Yes(Formed signed by grandson, Donivan Scull )    Erenest Rasher, RN 09/17/2018, 11:11 AM

## 2018-09-18 DIAGNOSIS — S82142A Displaced bicondylar fracture of left tibia, initial encounter for closed fracture: Secondary | ICD-10-CM | POA: Diagnosis not present

## 2018-09-18 LAB — GLUCOSE, CAPILLARY
Glucose-Capillary: 188 mg/dL — ABNORMAL HIGH (ref 70–99)
Glucose-Capillary: 196 mg/dL — ABNORMAL HIGH (ref 70–99)

## 2018-09-18 NOTE — Progress Notes (Signed)
Reviewed discharge paperwork with family with full understanding of paperwork and medications

## 2018-09-18 NOTE — Progress Notes (Signed)
Subjective: 5 Days Post-Op Procedure(s) (LRB): OPEN TREATMENT OF LEFT TIBIAL PLATEAU REPAIR, NONUNION TIBIA REPAIR PARTIAL MENISECTOMY (Left)   Patient doing well and hoping to go home today he is waiting on getting a full electric bed, wheelchair, and 3n1 bedside commode for his home.   Diet tolerance:  ok Voiding:  ok Patient reports pain as mild.    Objective: Vital signs in last 24 hours: Temp:  [97.7 F (36.5 C)-98.7 F (37.1 C)] 97.7 F (36.5 C) (10/19 0426) Pulse Rate:  [47-98] 47 (10/19 0426) Resp:  [16] 16 (10/18 1357) BP: (146-169)/(57-96) 160/57 (10/19 0426) SpO2:  [93 %-95 %] 95 % (10/19 0426)  Labs: No results for input(s): HGB in the last 72 hours. No results for input(s): WBC, RBC, HCT, PLT in the last 72 hours. No results for input(s): NA, K, CL, CO2, BUN, CREATININE, GLUCOSE, CALCIUM in the last 72 hours. No results for input(s): LABPT, INR in the last 72 hours.  Physical Exam:  Neurologically intact ABD soft Neurovascular intact Sensation intact distally Intact pulses distally Dorsiflexion/Plantar flexion intact Incision: dressing C/D/I and no drainage No cellulitis present Compartment soft  Assessment/Plan:  5 Days Post-Op Procedure(s) (LRB): OPEN TREATMENT OF LEFT TIBIAL PLATEAU REPAIR, NONUNION TIBIA REPAIR PARTIAL MENISECTOMY (Left) Advance diet Up with therapy Discharge home with home health today once he has his full electric bed, wheelchair, and 3n1 bedside commode. Follow up with Dr. Lucia Gaskins in the office as shceduled  Adriana Lina, Larwance Sachs 09/18/2018, 7:55 AM
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
## 2018-09-18 NOTE — Care Management (Signed)
After multiple calls to [facility], Arnetha Massy (liaison for agency) returned call and said all DME is on schedule to deliver today.  Barnetta Chapel will call grandson's number and give delivery time.

## 2018-09-18 NOTE — Progress Notes (Signed)
Called [facility] regarding bed that has not been delivered to pt's house Mitzi Hansen states he can go do to having all his other equipment delivered to his house.

## 2018-09-24 ENCOUNTER — Telehealth: Payer: Self-pay | Admitting: Internal Medicine

## 2018-09-24 NOTE — Telephone Encounter (Signed)
Called and spoke with pt letting him know that we needed to get him scheduled for an appt before we could do anything in regards tohis cpap mask since it had been a little over a year since we had seen him  Pt expressed understanding. ROV has been scheduled for pt. Nothing further needed.

## 2018-09-29 ENCOUNTER — Encounter: Payer: Self-pay | Admitting: Adult Health

## 2018-09-29 ENCOUNTER — Ambulatory Visit: Payer: Medicare HMO | Admitting: Adult Health

## 2018-09-29 DIAGNOSIS — G4733 Obstructive sleep apnea (adult) (pediatric): Secondary | ICD-10-CM

## 2018-09-29 NOTE — Assessment & Plan Note (Signed)
Moderate sleep apnea.  Patient has cardiac risk factors.  Have advised him to restart CPAP.  Will send order for supplies. If DME declines.  Patient will need a split-night study  Plan  Patient Instructions  Restart CPAP at bedtime goal was to wear for at least 4 to 6 hours or more each night Do not drive a sleepy Work on healthy weight Order for supplies sent to your home care company of choice Will need to get a SD CARD Follow up in 6 weeks with a CPAP download and as needed

## 2018-09-29 NOTE — Addendum Note (Signed)
Addended by: Parke Poisson E on: 09/29/2018 10:48 AM   Modules accepted: Orders
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
## 2018-09-29 NOTE — Patient Instructions (Signed)
Restart CPAP at bedtime goal was to wear for at least 4 to 6 hours or more each night Do not drive a sleepy Work on healthy weight Order for supplies sent to your home care company of choice Will need to get a SD CARD Follow up in 6 weeks with a CPAP download and as needed

## 2018-09-29 NOTE — Assessment & Plan Note (Signed)
Wt loss  

## 2018-09-30 NOTE — Addendum Note (Signed)
Addended by: Parke Poisson E on: 09/30/2018 04:13 PM   Modules accepted: Orders

## 2018-11-10 ENCOUNTER — Encounter: Payer: Medicare HMO | Admitting: *Deleted

## 2018-11-10 DIAGNOSIS — Z006 Encounter for examination for normal comparison and control in clinical research program: Secondary | ICD-10-CM

## 2018-11-10 NOTE — Research (Signed)
Subject met inclusion and exclusion criteria.  The informed consent form, study requirements and expectations were reviewed with the subject and questions and concerns were addressed prior to the signing of the consent form.  The subject verbalized understanding of the trial requirements.  The subject agreed to participate in the [id number]  trial and signed the informed consent.  The informed consent was obtained prior to performance of any protocol-specific procedures for the subject.  A copy of the signed informed consent was given to the subject and a copy was placed in the subject's medical record.  Screening visit completed and subject started in run-in phase.  Next appointment scheduled. 

## 2018-11-11 ENCOUNTER — Ambulatory Visit: Payer: Medicare HMO | Admitting: Adult Health

## 2019-01-05 ENCOUNTER — Encounter: Payer: Medicare HMO | Admitting: *Deleted

## 2019-01-05 DIAGNOSIS — Z006 Encounter for examination for normal comparison and control in clinical research program: Secondary | ICD-10-CM

## 2019-01-05 NOTE — Research (Signed)
Patient to research clinic for randomization visit in the [facility] 4 research trial.  Patient received injection in clinic and next visit scheduled.

## 2019-02-21 ENCOUNTER — Telehealth: Payer: Self-pay

## 2019-02-21 ENCOUNTER — Ambulatory Visit: Payer: Medicare HMO | Admitting: Cardiovascular Disease

## 2019-02-21 NOTE — Telephone Encounter (Signed)
Due to COVID-19 pandemic, called to offer to reschedule patient or help set up Webex. The patient "feels great." He denies cardiac symptoms. He requests to cancel his appointment. He understands he will be called when precautions are lifted to reschedule. He was grateful for assistance.

## 2019-02-23 ENCOUNTER — Telehealth: Payer: Self-pay | Admitting: Cardiovascular Disease

## 2019-02-23 NOTE — Telephone Encounter (Signed)
° °  Patient requesting to speak with nurse regarding medication. Patient states he doesn't know the name of medication at this time. Scheduler asked for additional information , patient declined to give details.

## 2019-02-23 NOTE — Telephone Encounter (Signed)
[name] no longer has medication questions. He was grateful for call.

## 2019-02-24 ENCOUNTER — Ambulatory Visit: Payer: Medicare HMO | Admitting: Cardiovascular Disease

## 2019-03-02 NOTE — Telephone Encounter (Signed)
Scheduled patient 7/24 with Dr. Burt Knack.  He understands to call prior to that time if he has questions or concerns.

## 2019-03-31 ENCOUNTER — Telehealth: Payer: Self-pay | Admitting: *Deleted

## 2019-03-31 NOTE — Telephone Encounter (Signed)
Called patient to cancel upcoming appointment in the [facility] 4 research study.  LMOM with call back numbers.

## 2019-04-07 ENCOUNTER — Telehealth: Payer: Self-pay | Admitting: *Deleted

## 2019-04-07 NOTE — Telephone Encounter (Signed)
Called subject for follow up visit in the [place] 4 clinical trial. Call vs in person due to Covid 19 restrictions in place at the facility.  Next appointment scheduled.

## 2019-06-23 ENCOUNTER — Telehealth: Payer: Self-pay

## 2019-06-23 NOTE — Telephone Encounter (Signed)
I tried to call both numbers and there was no answer/call failed

## 2019-06-24 ENCOUNTER — Ambulatory Visit: Payer: Medicare HMO | Admitting: Cardiovascular Disease

## 2019-06-27 NOTE — Telephone Encounter (Signed)
Patient did not show for appointment on 06/24/19 with Dr. Burt Knack.

## 2019-08-25 ENCOUNTER — Other Ambulatory Visit: Payer: Self-pay

## 2019-08-25 ENCOUNTER — Encounter: Payer: Medicare HMO | Admitting: *Deleted

## 2019-08-25 VITALS — BP 132/68 | HR 66 | Wt 244.4 lb

## 2019-08-25 DIAGNOSIS — Z006 Encounter for examination for normal comparison and control in clinical research program: Secondary | ICD-10-CM

## 2019-08-25 NOTE — Research (Signed)
Subject to research clinic for follow up visit in the [place] research study.  No cos, aes or saes to report.  Injection given and next clinic visit scheduled.

## 2019-10-26 ENCOUNTER — Telehealth: Payer: Self-pay | Admitting: *Deleted

## 2019-10-26 NOTE — Telephone Encounter (Signed)
error 

## 2020-02-21 ENCOUNTER — Encounter: Payer: Medicare HMO | Admitting: *Deleted

## 2020-02-21 ENCOUNTER — Other Ambulatory Visit: Payer: Self-pay

## 2020-02-21 DIAGNOSIS — Z006 Encounter for examination for normal comparison and control in clinical research program: Secondary | ICD-10-CM

## 2020-02-21 NOTE — Research (Signed)
Patient to research clinic for visit 68M in the [place] research study.  Injection given and patient tolerated well. All questions answered and next appointment scheduled.

## 2020-06-25 ENCOUNTER — Emergency Department (HOSPITAL_COMMUNITY): Payer: Medicare HMO

## 2020-06-25 ENCOUNTER — Inpatient Hospital Stay (HOSPITAL_COMMUNITY)
Admission: EM | Admit: 2020-06-25 | Discharge: 2020-06-30 | DRG: 511 | Disposition: A | Discharge status: Home Health | Payer: Medicare HMO | Attending: Internal Medicine | Admitting: Internal Medicine

## 2020-06-25 DIAGNOSIS — G4733 Obstructive sleep apnea (adult) (pediatric): Secondary | ICD-10-CM | POA: Diagnosis present

## 2020-06-25 DIAGNOSIS — S52572A Other intraarticular fracture of lower end of left radius, initial encounter for closed fracture: Secondary | ICD-10-CM | POA: Diagnosis present

## 2020-06-25 DIAGNOSIS — S52562A Barton's fracture of left radius, initial encounter for closed fracture: Secondary | ICD-10-CM | POA: Diagnosis not present

## 2020-06-25 DIAGNOSIS — Z888 Allergy status to other drugs, medicaments and biological substances status: Secondary | ICD-10-CM

## 2020-06-25 DIAGNOSIS — S76309A Unspecified injury of muscle, fascia and tendon of the posterior muscle group at thigh level, unspecified thigh, initial encounter: Secondary | ICD-10-CM

## 2020-06-25 DIAGNOSIS — I482 Chronic atrial fibrillation, unspecified: Secondary | ICD-10-CM | POA: Diagnosis present

## 2020-06-25 DIAGNOSIS — I129 Hypertensive chronic kidney disease with stage 1 through stage 4 chronic kidney disease, or unspecified chronic kidney disease: Secondary | ICD-10-CM | POA: Diagnosis present

## 2020-06-25 DIAGNOSIS — R52 Pain, unspecified: Secondary | ICD-10-CM

## 2020-06-25 DIAGNOSIS — S76811A Strain of other specified muscles, fascia and tendons at thigh level, right thigh, initial encounter: Secondary | ICD-10-CM | POA: Diagnosis present

## 2020-06-25 DIAGNOSIS — I2510 Atherosclerotic heart disease of native coronary artery without angina pectoris: Secondary | ICD-10-CM | POA: Diagnosis present

## 2020-06-25 DIAGNOSIS — K219 Gastro-esophageal reflux disease without esophagitis: Secondary | ICD-10-CM | POA: Diagnosis present

## 2020-06-25 DIAGNOSIS — Z20822 Contact with and (suspected) exposure to covid-19: Secondary | ICD-10-CM | POA: Diagnosis present

## 2020-06-25 DIAGNOSIS — Y92410 Unspecified street and highway as the place of occurrence of the external cause: Secondary | ICD-10-CM

## 2020-06-25 DIAGNOSIS — S76311A Strain of muscle, fascia and tendon of the posterior muscle group at thigh level, right thigh, initial encounter: Secondary | ICD-10-CM

## 2020-06-25 DIAGNOSIS — Z7982 Long term (current) use of aspirin: Secondary | ICD-10-CM

## 2020-06-25 DIAGNOSIS — Z7902 Long term (current) use of antithrombotics/antiplatelets: Secondary | ICD-10-CM

## 2020-06-25 DIAGNOSIS — Z800 Family history of malignant neoplasm of digestive organs: Secondary | ICD-10-CM

## 2020-06-25 DIAGNOSIS — Z833 Family history of diabetes mellitus: Secondary | ICD-10-CM

## 2020-06-25 DIAGNOSIS — I255 Ischemic cardiomyopathy: Secondary | ICD-10-CM | POA: Diagnosis present

## 2020-06-25 DIAGNOSIS — Z7901 Long term (current) use of anticoagulants: Secondary | ICD-10-CM

## 2020-06-25 DIAGNOSIS — S52592A Other fractures of lower end of left radius, initial encounter for closed fracture: Secondary | ICD-10-CM | POA: Diagnosis present

## 2020-06-25 DIAGNOSIS — Z951 Presence of aortocoronary bypass graft: Secondary | ICD-10-CM

## 2020-06-25 DIAGNOSIS — M25332 Other instability, left wrist: Secondary | ICD-10-CM

## 2020-06-25 DIAGNOSIS — S76812A Strain of other specified muscles, fascia and tendons at thigh level, left thigh, initial encounter: Secondary | ICD-10-CM | POA: Diagnosis present

## 2020-06-25 DIAGNOSIS — Z886 Allergy status to analgesic agent status: Secondary | ICD-10-CM

## 2020-06-25 DIAGNOSIS — Z79899 Other long term (current) drug therapy: Secondary | ICD-10-CM

## 2020-06-25 DIAGNOSIS — Z87891 Personal history of nicotine dependence: Secondary | ICD-10-CM

## 2020-06-25 DIAGNOSIS — N1831 Chronic kidney disease, stage 3a: Secondary | ICD-10-CM | POA: Diagnosis present

## 2020-06-25 DIAGNOSIS — I10 Essential (primary) hypertension: Secondary | ICD-10-CM | POA: Diagnosis present

## 2020-06-25 DIAGNOSIS — S62101A Fracture of unspecified carpal bone, right wrist, initial encounter for closed fracture: Secondary | ICD-10-CM

## 2020-06-25 DIAGNOSIS — I4891 Unspecified atrial fibrillation: Secondary | ICD-10-CM | POA: Diagnosis present

## 2020-06-25 DIAGNOSIS — E785 Hyperlipidemia, unspecified: Secondary | ICD-10-CM | POA: Diagnosis present

## 2020-06-25 DIAGNOSIS — I252 Old myocardial infarction: Secondary | ICD-10-CM

## 2020-06-25 DIAGNOSIS — Z955 Presence of coronary angioplasty implant and graft: Secondary | ICD-10-CM

## 2020-06-25 DIAGNOSIS — M25551 Pain in right hip: Secondary | ICD-10-CM

## 2020-06-25 DIAGNOSIS — N182 Chronic kidney disease, stage 2 (mild): Secondary | ICD-10-CM | POA: Diagnosis present

## 2020-06-25 DIAGNOSIS — Z8679 Personal history of other diseases of the circulatory system: Secondary | ICD-10-CM

## 2020-06-25 DIAGNOSIS — E1122 Type 2 diabetes mellitus with diabetic chronic kidney disease: Secondary | ICD-10-CM | POA: Diagnosis present

## 2020-06-25 DIAGNOSIS — Z8249 Family history of ischemic heart disease and other diseases of the circulatory system: Secondary | ICD-10-CM

## 2020-06-25 DIAGNOSIS — Z9889 Other specified postprocedural states: Secondary | ICD-10-CM

## 2020-06-25 MED ORDER — LORAZEPAM 1 MG PO TABS
1.0000 mg | ORAL_TABLET | Freq: Once | ORAL | Status: AC
  Administered 2020-06-25: 1 mg via ORAL
  Filled 2020-06-25: qty 1

## 2020-06-25 MED ORDER — IBUPROFEN 400 MG PO TABS
400.0000 mg | ORAL_TABLET | Freq: Once | ORAL | Status: AC
  Administered 2020-06-25: 400 mg via ORAL
  Filled 2020-06-25: qty 1

## 2020-06-25 MED ORDER — OXYCODONE-ACETAMINOPHEN 5-325 MG PO TABS
1.0000 | ORAL_TABLET | Freq: Once | ORAL | Status: AC
  Administered 2020-06-25: 1 via ORAL
  Filled 2020-06-25: qty 1

## 2020-06-25 NOTE — ED Notes (Signed)
Pt transported to CT via stretcher.  
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
## 2020-06-26 ENCOUNTER — Emergency Department (HOSPITAL_COMMUNITY): Payer: Medicare HMO

## 2020-06-26 ENCOUNTER — Other Ambulatory Visit: Payer: Self-pay

## 2020-06-26 ENCOUNTER — Telehealth: Payer: Self-pay | Admitting: Family Medicine

## 2020-06-26 ENCOUNTER — Ambulatory Visit: Payer: Medicare HMO | Admitting: Family Medicine

## 2020-06-26 ENCOUNTER — Encounter: Payer: Self-pay | Admitting: Family Medicine

## 2020-06-26 DIAGNOSIS — S76309A Unspecified injury of muscle, fascia and tendon of the posterior muscle group at thigh level, unspecified thigh, initial encounter: Secondary | ICD-10-CM

## 2020-06-26 DIAGNOSIS — S62101A Fracture of unspecified carpal bone, right wrist, initial encounter for closed fracture: Secondary | ICD-10-CM

## 2020-06-26 DIAGNOSIS — S52502A Unspecified fracture of the lower end of left radius, initial encounter for closed fracture: Secondary | ICD-10-CM

## 2020-06-26 DIAGNOSIS — I25119 Atherosclerotic heart disease of native coronary artery with unspecified angina pectoris: Secondary | ICD-10-CM | POA: Diagnosis not present

## 2020-06-26 DIAGNOSIS — N182 Chronic kidney disease, stage 2 (mild): Secondary | ICD-10-CM | POA: Diagnosis not present

## 2020-06-26 DIAGNOSIS — S62102A Fracture of unspecified carpal bone, left wrist, initial encounter for closed fracture: Secondary | ICD-10-CM

## 2020-06-26 DIAGNOSIS — K219 Gastro-esophageal reflux disease without esophagitis: Secondary | ICD-10-CM | POA: Diagnosis not present

## 2020-06-26 LAB — BASIC METABOLIC PANEL
Anion gap: 11 (ref 5–15)
BUN: 24 mg/dL — ABNORMAL HIGH (ref 8–23)
CO2: 22 mmol/L (ref 22–32)
Calcium: 9.1 mg/dL (ref 8.9–10.3)
Chloride: 102 mmol/L (ref 98–111)
Creatinine, Ser: 1.3 mg/dL — ABNORMAL HIGH (ref 0.61–1.24)
GFR calc Af Amer: >60 mL/min (ref >60)
GFR calc non Af Amer: 53 mL/min — ABNORMAL LOW (ref >60)
Glucose, Bld: 144 mg/dL — ABNORMAL HIGH (ref 70–99)
Potassium: 4.1 mmol/L (ref 3.5–5.1)
Sodium: 135 mmol/L (ref 135–145)

## 2020-06-26 LAB — CBC WITH DIFFERENTIAL/PLATELET
Abs Immature Granulocytes: 0.02 10*3/uL (ref 0.00–0.07)
Basophils Absolute: 0.1 10*3/uL (ref 0.0–0.1)
Basophils Relative: 1 %
Eosinophils Absolute: 0.2 10*3/uL (ref 0.0–0.5)
Eosinophils Relative: 2 %
HCT: 48.5 % (ref 39.0–52.0)
Hemoglobin: 15.4 g/dL (ref 13.0–17.0)
Immature Granulocytes: 0 %
Lymphocytes Relative: 19 %
Lymphs Abs: 1.8 10*3/uL (ref 0.7–4.0)
MCH: 29.8 pg (ref 26.0–34.0)
MCHC: 31.8 g/dL (ref 30.0–36.0)
MCV: 94 fL (ref 80.0–100.0)
Monocytes Absolute: 1.1 10*3/uL — ABNORMAL HIGH (ref 0.1–1.0)
Monocytes Relative: 11 %
Neutro Abs: 6.3 10*3/uL (ref 1.7–7.7)
Neutrophils Relative %: 67 %
Platelets: 169 10*3/uL (ref 150–400)
RBC: 5.16 MIL/uL (ref 4.22–5.81)
RDW: 14.2 % (ref 11.5–15.5)
WBC: 9.4 10*3/uL (ref 4.0–10.5)
nRBC: 0 % (ref 0.0–0.2)

## 2020-06-26 LAB — CBG MONITORING, ED: Glucose-Capillary: 119 mg/dL — ABNORMAL HIGH (ref 70–99)

## 2020-06-26 MED ORDER — ADULT MULTIVITAMIN W/MINERALS CH
1.0000 | ORAL_TABLET | Freq: Every day | ORAL | Status: DC
  Administered 2020-06-26 – 2020-06-30 (×4): 1 via ORAL
  Filled 2020-06-26 (×4): qty 1

## 2020-06-26 MED ORDER — MONTELUKAST SODIUM 10 MG PO TABS
10.0000 mg | ORAL_TABLET | Freq: Every day | ORAL | Status: DC
  Administered 2020-06-26 – 2020-06-29 (×4): 10 mg via ORAL
  Filled 2020-06-26 (×4): qty 1

## 2020-06-26 MED ORDER — ONDANSETRON HCL 4 MG PO TABS: 4.0000 mg | ORAL_TABLET | Freq: Four times a day (QID) | ORAL | Status: DC | PRN

## 2020-06-26 MED ORDER — HYDRALAZINE HCL 20 MG/ML IJ SOLN: 10.0000 mg | Freq: Four times a day (QID) | INTRAMUSCULAR | Status: DC | PRN

## 2020-06-26 MED ORDER — ASPIRIN 81 MG PO CHEW
81.0000 mg | CHEWABLE_TABLET | Freq: Every day | ORAL | Status: DC
  Administered 2020-06-26 – 2020-06-30 (×4): 81 mg via ORAL
  Filled 2020-06-26 (×4): qty 1

## 2020-06-26 MED ORDER — FENTANYL CITRATE (PF) 100 MCG/2ML IJ SOLN: 25.0000 ug | INTRAMUSCULAR | Status: DC | PRN

## 2020-06-26 MED ORDER — INSULIN ASPART 100 UNIT/ML ~~LOC~~ SOLN
0.0000 [IU] | Freq: Three times a day (TID) | SUBCUTANEOUS | Status: DC
  Administered 2020-06-27 (×3): 1 [IU] via SUBCUTANEOUS
  Administered 2020-06-28: 2 [IU] via SUBCUTANEOUS
  Administered 2020-06-28: 1 [IU] via SUBCUTANEOUS
  Administered 2020-06-29: 2 [IU] via SUBCUTANEOUS
  Administered 2020-06-29: 1 [IU] via SUBCUTANEOUS
  Administered 2020-06-30: 2 [IU] via SUBCUTANEOUS
  Administered 2020-06-30: 1 [IU] via SUBCUTANEOUS

## 2020-06-26 MED ORDER — OXYCODONE-ACETAMINOPHEN 5-325 MG PO TABS: 1.0000 | ORAL_TABLET | Freq: Four times a day (QID) | ORAL | 0 refills | Status: DC | PRN

## 2020-06-26 MED ORDER — OXYCODONE HCL 5 MG PO TABS
5.0000 mg | ORAL_TABLET | ORAL | Status: DC | PRN
  Administered 2020-06-29: 5 mg via ORAL
  Filled 2020-06-26: qty 1

## 2020-06-26 MED ORDER — PIOGLITAZONE HCL 30 MG PO TABS
30.0000 mg | ORAL_TABLET | Freq: Every day | ORAL | Status: DC
  Filled 2020-06-26: qty 1

## 2020-06-26 MED ORDER — MORPHINE SULFATE (PF) 2 MG/ML IV SOLN
2.0000 mg | Freq: Once | INTRAVENOUS | Status: DC
  Filled 2020-06-26: qty 1

## 2020-06-26 MED ORDER — LATANOPROST 0.005 % OP SOLN
1.0000 [drp] | Freq: Every day | OPHTHALMIC | Status: DC
  Filled 2020-06-26: qty 2.5

## 2020-06-26 MED ORDER — PANTOPRAZOLE SODIUM 40 MG PO TBEC
40.0000 mg | DELAYED_RELEASE_TABLET | Freq: Every day | ORAL | Status: DC
  Administered 2020-06-26 – 2020-06-29 (×4): 40 mg via ORAL
  Filled 2020-06-26 (×5): qty 1

## 2020-06-26 MED ORDER — POLYETHYLENE GLYCOL 3350 17 G PO PACK: 17.0000 g | PACK | Freq: Every day | ORAL | Status: DC | PRN

## 2020-06-26 MED ORDER — LORAZEPAM 1 MG PO TABS
1.0000 mg | ORAL_TABLET | Freq: Once | ORAL | Status: AC
  Administered 2020-06-26: 1 mg via ORAL
  Filled 2020-06-26: qty 1

## 2020-06-26 MED ORDER — LORATADINE 10 MG PO TABS
10.0000 mg | ORAL_TABLET | Freq: Every day | ORAL | Status: DC
  Administered 2020-06-26 – 2020-06-30 (×3): 10 mg via ORAL
  Filled 2020-06-26 (×3): qty 1

## 2020-06-26 MED ORDER — METOPROLOL SUCCINATE ER 25 MG PO TB24
25.0000 mg | ORAL_TABLET | Freq: Every day | ORAL | Status: DC
  Administered 2020-06-26 – 2020-06-30 (×4): 25 mg via ORAL
  Filled 2020-06-26 (×5): qty 1

## 2020-06-26 MED ORDER — EMPAGLIFLOZIN 25 MG PO TABS
25.0000 mg | ORAL_TABLET | Freq: Every day | ORAL | Status: DC
  Filled 2020-06-26: qty 1

## 2020-06-26 MED ORDER — ACETAMINOPHEN 650 MG RE SUPP: 650.0000 mg | Freq: Four times a day (QID) | RECTAL | Status: DC | PRN

## 2020-06-26 MED ORDER — ACETAMINOPHEN 325 MG PO TABS
650.0000 mg | ORAL_TABLET | Freq: Four times a day (QID) | ORAL | Status: DC | PRN
  Administered 2020-06-29: 650 mg via ORAL
  Filled 2020-06-26: qty 2

## 2020-06-26 MED ORDER — OXYCODONE-ACETAMINOPHEN 5-325 MG PO TABS
1.0000 | ORAL_TABLET | Freq: Once | ORAL | Status: AC
  Administered 2020-06-26: 1 via ORAL
  Filled 2020-06-26: qty 1

## 2020-06-26 NOTE — Telephone Encounter (Signed)
Patient called stating that he received his MRI results from the hospital and they told him that he has a complete tear of his right hamstring and a partial tear of his left. The doctor said that it would just heal on its own but he is concerned about that. He asked if there was a way for Dr Tamala Julian to recommend Dr Lucia Gaskins to see him and do surgery being that he has been seen by him in the past?  Any advice?

## 2020-06-26 NOTE — Progress Notes (Signed)
Orthopedic Tech Progress Note Patient Details:  [name] [facility] Apr 13, 1941 [id number]  Ortho Devices Type of Ortho Device: Arm sling, Sugartong splint Ortho Device/Splint Location: lue Ortho Device/Splint Interventions: Ordered, Application, Adjustment   Post Interventions Patient Tolerated: Well Instructions Provided: Care of device, Adjustment of device   Karolee Stamps 06/26/2020, 2:02 AM
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
## 2020-06-26 NOTE — ED Notes (Signed)
Pt refused labs from phlebotomy
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
## 2020-06-26 NOTE — ED Notes (Signed)
Patient noted very sleepy unable to walk at this time.
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
## 2020-06-26 NOTE — ED Notes (Signed)
This tech noted that this pt's 02 sats drop to between 88-91% while resting. The pt states that he does not have sleep apnea and does not use a cpap.  This tech infonrmed the pt that we would normally place the pt on oxygen to support his sats while resting.  Pt stated that he understood but did not want to wear oxygen.    RN notified.
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
## 2020-06-26 NOTE — Progress Notes (Unsigned)
amb  
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
## 2020-06-26 NOTE — Telephone Encounter (Signed)
Spoke with patient's son. On schedule to speak with Dr. Tamala Julian.

## 2020-06-26 NOTE — ED Notes (Signed)
Dr. Bobbye Morton paged to [phone] Dr. Sherry Ruffing paged by Levada Dy
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
## 2020-06-26 NOTE — Consult Note (Signed)
Patient has been seen and examined at bedside.  The patient has a left displaced distal radius fracture.  The right arm is reviewed and is stable.  Left arm has a splint applied.  He is sensate with refill.  I have explained to he and his family/son that I would recommend a surgical approach for the complex comminuted volar Barton's fracture.  He has subluxation of his carpus.  My operative plan would be a volar wrist plate to try and stabilize the volar rim although this is very comminuted and distal.  If this does not allow for firm fixation I would move forward with a spanning wrist plate.  I discussed this with him and his family.  They understand the plan of care.  This is an operation which can be performed electively.  I would look at our schedule and move forward with elective surgical reconstruction.  In regards to his lower extremities I would defer to Dr. Mardelle Matte regarding the treatment of his hamstrings injuries.  In terms of physical therapy he can place weight on his left elbow but no weight on his left wrist.  He can place full weight on his right upper extremity.  Rickeya Manus MD

## 2020-06-26 NOTE — ED Notes (Signed)
Water and gingerale provided. °

## 2020-06-26 NOTE — ED Notes (Signed)
Lunch tray ordered 

## 2020-06-27 ENCOUNTER — Ambulatory Visit: Payer: Medicare HMO | Admitting: Family Medicine

## 2020-06-27 DIAGNOSIS — S52592A Other fractures of lower end of left radius, initial encounter for closed fracture: Secondary | ICD-10-CM | POA: Diagnosis present

## 2020-06-27 DIAGNOSIS — I482 Chronic atrial fibrillation, unspecified: Secondary | ICD-10-CM | POA: Diagnosis present

## 2020-06-27 DIAGNOSIS — E1122 Type 2 diabetes mellitus with diabetic chronic kidney disease: Secondary | ICD-10-CM | POA: Diagnosis present

## 2020-06-27 DIAGNOSIS — Z800 Family history of malignant neoplasm of digestive organs: Secondary | ICD-10-CM | POA: Diagnosis not present

## 2020-06-27 DIAGNOSIS — I2510 Atherosclerotic heart disease of native coronary artery without angina pectoris: Secondary | ICD-10-CM | POA: Diagnosis present

## 2020-06-27 DIAGNOSIS — Z8249 Family history of ischemic heart disease and other diseases of the circulatory system: Secondary | ICD-10-CM | POA: Diagnosis not present

## 2020-06-27 DIAGNOSIS — S76812A Strain of other specified muscles, fascia and tendons at thigh level, left thigh, initial encounter: Secondary | ICD-10-CM | POA: Diagnosis present

## 2020-06-27 DIAGNOSIS — Z79899 Other long term (current) drug therapy: Secondary | ICD-10-CM | POA: Diagnosis not present

## 2020-06-27 DIAGNOSIS — Z87891 Personal history of nicotine dependence: Secondary | ICD-10-CM | POA: Diagnosis not present

## 2020-06-27 DIAGNOSIS — Z7982 Long term (current) use of aspirin: Secondary | ICD-10-CM | POA: Diagnosis not present

## 2020-06-27 DIAGNOSIS — Z7901 Long term (current) use of anticoagulants: Secondary | ICD-10-CM | POA: Diagnosis not present

## 2020-06-27 DIAGNOSIS — S76301A Unspecified injury of muscle, fascia and tendon of the posterior muscle group at thigh level, right thigh, initial encounter: Secondary | ICD-10-CM | POA: Diagnosis not present

## 2020-06-27 DIAGNOSIS — S52562A Barton's fracture of left radius, initial encounter for closed fracture: Secondary | ICD-10-CM | POA: Diagnosis present

## 2020-06-27 DIAGNOSIS — G4733 Obstructive sleep apnea (adult) (pediatric): Secondary | ICD-10-CM | POA: Diagnosis present

## 2020-06-27 DIAGNOSIS — S76811A Strain of other specified muscles, fascia and tendons at thigh level, right thigh, initial encounter: Secondary | ICD-10-CM | POA: Diagnosis present

## 2020-06-27 DIAGNOSIS — I255 Ischemic cardiomyopathy: Secondary | ICD-10-CM | POA: Diagnosis present

## 2020-06-27 DIAGNOSIS — S52572A Other intraarticular fracture of lower end of left radius, initial encounter for closed fracture: Secondary | ICD-10-CM | POA: Diagnosis present

## 2020-06-27 DIAGNOSIS — I129 Hypertensive chronic kidney disease with stage 1 through stage 4 chronic kidney disease, or unspecified chronic kidney disease: Secondary | ICD-10-CM | POA: Diagnosis present

## 2020-06-27 DIAGNOSIS — R52 Pain, unspecified: Secondary | ICD-10-CM | POA: Diagnosis present

## 2020-06-27 DIAGNOSIS — I25119 Atherosclerotic heart disease of native coronary artery with unspecified angina pectoris: Secondary | ICD-10-CM | POA: Diagnosis not present

## 2020-06-27 DIAGNOSIS — K219 Gastro-esophageal reflux disease without esophagitis: Secondary | ICD-10-CM | POA: Diagnosis present

## 2020-06-27 DIAGNOSIS — E785 Hyperlipidemia, unspecified: Secondary | ICD-10-CM | POA: Diagnosis present

## 2020-06-27 DIAGNOSIS — Z7902 Long term (current) use of antithrombotics/antiplatelets: Secondary | ICD-10-CM | POA: Diagnosis not present

## 2020-06-27 DIAGNOSIS — Z951 Presence of aortocoronary bypass graft: Secondary | ICD-10-CM | POA: Diagnosis not present

## 2020-06-27 DIAGNOSIS — Z833 Family history of diabetes mellitus: Secondary | ICD-10-CM | POA: Diagnosis not present

## 2020-06-27 DIAGNOSIS — I252 Old myocardial infarction: Secondary | ICD-10-CM | POA: Diagnosis not present

## 2020-06-27 DIAGNOSIS — N182 Chronic kidney disease, stage 2 (mild): Secondary | ICD-10-CM

## 2020-06-27 DIAGNOSIS — Z20822 Contact with and (suspected) exposure to covid-19: Secondary | ICD-10-CM | POA: Diagnosis present

## 2020-06-27 DIAGNOSIS — Y92410 Unspecified street and highway as the place of occurrence of the external cause: Secondary | ICD-10-CM | POA: Diagnosis not present

## 2020-06-27 DIAGNOSIS — I4891 Unspecified atrial fibrillation: Secondary | ICD-10-CM | POA: Diagnosis not present

## 2020-06-27 DIAGNOSIS — N1831 Chronic kidney disease, stage 3a: Secondary | ICD-10-CM | POA: Diagnosis present

## 2020-06-27 LAB — CBC
HCT: 46.3 % (ref 39.0–52.0)
Hemoglobin: 15 g/dL (ref 13.0–17.0)
MCH: 29.8 pg (ref 26.0–34.0)
MCHC: 32.4 g/dL (ref 30.0–36.0)
MCV: 91.9 fL (ref 80.0–100.0)
Platelets: 161 10*3/uL (ref 150–400)
RBC: 5.04 MIL/uL (ref 4.22–5.81)
RDW: 14 % (ref 11.5–15.5)
WBC: 7.4 10*3/uL (ref 4.0–10.5)
nRBC: 0 % (ref 0.0–0.2)

## 2020-06-27 LAB — BASIC METABOLIC PANEL
Anion gap: 10 (ref 5–15)
BUN: 18 mg/dL (ref 8–23)
CO2: 25 mmol/L (ref 22–32)
Calcium: 9.1 mg/dL (ref 8.9–10.3)
Chloride: 102 mmol/L (ref 98–111)
Creatinine, Ser: 1.15 mg/dL (ref 0.61–1.24)
GFR calc Af Amer: >60 mL/min (ref >60)
GFR calc non Af Amer: >60 mL/min (ref >60)
Glucose, Bld: 161 mg/dL — ABNORMAL HIGH (ref 70–99)
Potassium: 3.8 mmol/L (ref 3.5–5.1)
Sodium: 137 mmol/L (ref 135–145)

## 2020-06-27 LAB — HEMOGLOBIN A1C
Hgb A1c MFr Bld: 7.3 % — ABNORMAL HIGH (ref 4.8–5.6)
Mean Plasma Glucose: 162.81 mg/dL

## 2020-06-27 LAB — GLUCOSE, CAPILLARY
Glucose-Capillary: 137 mg/dL — ABNORMAL HIGH (ref 70–99)
Glucose-Capillary: 129 mg/dL — ABNORMAL HIGH (ref 70–99)
Glucose-Capillary: 135 mg/dL — ABNORMAL HIGH (ref 70–99)
Glucose-Capillary: 152 mg/dL — ABNORMAL HIGH (ref 70–99)

## 2020-06-27 LAB — SARS CORONAVIRUS 2 BY RT PCR (DIASORIN): SARS Coronavirus 2: NEGATIVE

## 2020-06-27 NOTE — Care Management Obs Status (Signed)
[facility] NOTIFICATION   Patient Details  Name: [name] MRN: [id number] Date of Birth: 1941/09/28   Medicare Observation Status Notification Given:  Yes    Joanne Chars, LCSW 06/27/2020, 3:25 PM

## 2020-06-27 NOTE — Progress Notes (Signed)
Patient ID: [name], male   DOB: 1941-03-06, 79 y.o.   MRN: [id number] Patient seen at bedside. Vital signs stable.  Voiding. I discussed with him all issues and concerns.  He is breathing well he has been up with therapy.  He is tolerating his diet.  Today he concentrated on a platform walker use and had a long discussion with therapy regarding his plan.  At present time he is feeling much more comfortable with conservative management of his hamstrings tear after speaking with therapy.  We appreciate their great help.  He and I reviewed this at length.  We will continue platform walker with weightbearing through the left elbow and no weight through the left wrist.  I discussed with him our plan for surgery Friday afternoon.  We will transition him to an inpatient status.  If we can work on ambulation today and tomorrow and get him mobilized in regards to the hamstrings tears with therapy then he would be a perfect set up for surgery Friday.  He will be more limited Friday and Saturday after the surgery with pain management and other measures to address.  I feel this will be less shocking to his system rather than having to deal with all of the aftermath at once.  He agrees.  Thus, we will plan to proceed with surgery this Friday afternoon.  We will concentrate on mobilization prior to that time so that we can make the transition postoperatively easier for him I hope.  I discussed this with he and his son at great length at bedside.  They are both very comfortable with this.  Unfortunately, it is been a very rocky road with a extended wait in the emergency room that basically left him in the ER for over 24 hours prior to getting to the floor etc.  He is now in a better state and feels very reasonable with the plan.  He will be n.p.o. after midnight Thursday.  I discussed with him the surgical algorithm as noted in my prior notes for the surgical reconstruction attempts Friday for the  left wrist which is quite complex  Eyva Califano MD
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
## 2020-06-28 DIAGNOSIS — K219 Gastro-esophageal reflux disease without esophagitis: Secondary | ICD-10-CM | POA: Diagnosis not present

## 2020-06-28 DIAGNOSIS — S76301A Unspecified injury of muscle, fascia and tendon of the posterior muscle group at thigh level, right thigh, initial encounter: Secondary | ICD-10-CM

## 2020-06-28 DIAGNOSIS — Z8679 Personal history of other diseases of the circulatory system: Secondary | ICD-10-CM

## 2020-06-28 DIAGNOSIS — N182 Chronic kidney disease, stage 2 (mild): Secondary | ICD-10-CM | POA: Diagnosis not present

## 2020-06-28 DIAGNOSIS — I25119 Atherosclerotic heart disease of native coronary artery with unspecified angina pectoris: Secondary | ICD-10-CM | POA: Diagnosis not present

## 2020-06-28 DIAGNOSIS — S62101A Fracture of unspecified carpal bone, right wrist, initial encounter for closed fracture: Secondary | ICD-10-CM

## 2020-06-28 DIAGNOSIS — S52592A Other fractures of lower end of left radius, initial encounter for closed fracture: Secondary | ICD-10-CM

## 2020-06-28 DIAGNOSIS — Z9889 Other specified postprocedural states: Secondary | ICD-10-CM

## 2020-06-28 LAB — GLUCOSE, CAPILLARY
Glucose-Capillary: 147 mg/dL — ABNORMAL HIGH (ref 70–99)
Glucose-Capillary: 152 mg/dL — ABNORMAL HIGH (ref 70–99)
Glucose-Capillary: 132 mg/dL — ABNORMAL HIGH (ref 70–99)
Glucose-Capillary: 137 mg/dL — ABNORMAL HIGH (ref 70–99)
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
## 2020-06-29 ENCOUNTER — Inpatient Hospital Stay (HOSPITAL_COMMUNITY): Payer: Medicare HMO

## 2020-06-29 ENCOUNTER — Inpatient Hospital Stay (HOSPITAL_COMMUNITY): Payer: Medicare HMO | Admitting: Certified Registered Nurse Anesthetist

## 2020-06-29 ENCOUNTER — Encounter (HOSPITAL_COMMUNITY)
Admission: EM | Disposition: A | Discharge status: Home Health | Payer: Self-pay | Source: Home / Self Care | Attending: Internal Medicine

## 2020-06-29 ENCOUNTER — Encounter (HOSPITAL_COMMUNITY): Payer: Self-pay | Admitting: Internal Medicine

## 2020-06-29 DIAGNOSIS — K219 Gastro-esophageal reflux disease without esophagitis: Secondary | ICD-10-CM | POA: Diagnosis not present

## 2020-06-29 DIAGNOSIS — I25119 Atherosclerotic heart disease of native coronary artery with unspecified angina pectoris: Secondary | ICD-10-CM | POA: Diagnosis not present

## 2020-06-29 DIAGNOSIS — N182 Chronic kidney disease, stage 2 (mild): Secondary | ICD-10-CM | POA: Diagnosis not present

## 2020-06-29 HISTORY — PX: ORIF WRIST FRACTURE: SHX2133

## 2020-06-29 LAB — BASIC METABOLIC PANEL
Anion gap: 10 (ref 5–15)
BUN: 22 mg/dL (ref 8–23)
CO2: 24 mmol/L (ref 22–32)
Calcium: 8.5 mg/dL — ABNORMAL LOW (ref 8.9–10.3)
Chloride: 102 mmol/L (ref 98–111)
Creatinine, Ser: 1.09 mg/dL (ref 0.61–1.24)
GFR calc Af Amer: >60 mL/min (ref >60)
GFR calc non Af Amer: >60 mL/min (ref >60)
Glucose, Bld: 166 mg/dL — ABNORMAL HIGH (ref 70–99)
Potassium: 4.1 mmol/L (ref 3.5–5.1)
Sodium: 136 mmol/L (ref 135–145)

## 2020-06-29 LAB — CBC
HCT: 44.1 % (ref 39.0–52.0)
Hemoglobin: 14.7 g/dL (ref 13.0–17.0)
MCH: 30.6 pg (ref 26.0–34.0)
MCHC: 33.3 g/dL (ref 30.0–36.0)
MCV: 91.9 fL (ref 80.0–100.0)
Platelets: 158 10*3/uL (ref 150–400)
RBC: 4.8 MIL/uL (ref 4.22–5.81)
RDW: 13.7 % (ref 11.5–15.5)
WBC: 6.6 10*3/uL (ref 4.0–10.5)
nRBC: 0 % (ref 0.0–0.2)

## 2020-06-29 LAB — SURGICAL PCR SCREEN
MRSA, PCR: NEGATIVE
Staphylococcus aureus: NEGATIVE

## 2020-06-29 LAB — GLUCOSE, CAPILLARY
Glucose-Capillary: 159 mg/dL — ABNORMAL HIGH (ref 70–99)
Glucose-Capillary: 121 mg/dL — ABNORMAL HIGH (ref 70–99)
Glucose-Capillary: 111 mg/dL — ABNORMAL HIGH (ref 70–99)
Glucose-Capillary: 125 mg/dL — ABNORMAL HIGH (ref 70–99)

## 2020-06-29 SURGERY — OPEN REDUCTION INTERNAL FIXATION (ORIF) WRIST FRACTURE
Anesthesia: General | Site: Wrist | Laterality: Left

## 2020-06-29 MED ORDER — CEFAZOLIN SODIUM-DEXTROSE 2-3 GM-%(50ML) IV SOLR
INTRAVENOUS | Status: DC | PRN
  Administered 2020-06-29: 2 g via INTRAVENOUS

## 2020-06-29 MED ORDER — ACETAMINOPHEN 500 MG PO TABS
ORAL_TABLET | ORAL | Status: AC
  Filled 2020-06-29: qty 2

## 2020-06-29 MED ORDER — SENNOSIDES-DOCUSATE SODIUM 8.6-50 MG PO TABS
1.0000 | ORAL_TABLET | Freq: Two times a day (BID) | ORAL | Status: DC
  Administered 2020-06-30: 1 via ORAL
  Filled 2020-06-29 (×2): qty 1

## 2020-06-29 MED ORDER — CHLORHEXIDINE GLUCONATE CLOTH 2 % EX PADS
6.0000 | MEDICATED_PAD | Freq: Once | CUTANEOUS | Status: AC
  Administered 2020-06-29: 6 via TOPICAL

## 2020-06-29 MED ORDER — PHENYLEPHRINE 40 MCG/ML (10ML) SYRINGE FOR IV PUSH (FOR BLOOD PRESSURE SUPPORT)
PREFILLED_SYRINGE | INTRAVENOUS | Status: AC
  Filled 2020-06-29: qty 10

## 2020-06-29 MED ORDER — CHLORHEXIDINE GLUCONATE 0.12 % MT SOLN
OROMUCOSAL | Status: AC
  Administered 2020-06-29: 15 mL via OROMUCOSAL
  Filled 2020-06-29: qty 15

## 2020-06-29 MED ORDER — FENTANYL CITRATE (PF) 250 MCG/5ML IJ SOLN
INTRAMUSCULAR | Status: DC | PRN
  Administered 2020-06-29 (×3): 50 ug via INTRAVENOUS

## 2020-06-29 MED ORDER — FENTANYL CITRATE (PF) 100 MCG/2ML IJ SOLN: 25.0000 ug | INTRAMUSCULAR | Status: DC | PRN

## 2020-06-29 MED ORDER — SODIUM CHLORIDE 0.9 % IV SOLN: Freq: Once | INTRAVENOUS | Status: AC

## 2020-06-29 MED ORDER — METHOCARBAMOL 500 MG PO TABS: 500.0000 mg | ORAL_TABLET | Freq: Four times a day (QID) | ORAL | Status: DC | PRN

## 2020-06-29 MED ORDER — OXYCODONE HCL 5 MG/5ML PO SOLN: 5.0000 mg | Freq: Once | ORAL | Status: AC | PRN

## 2020-06-29 MED ORDER — FENTANYL CITRATE (PF) 250 MCG/5ML IJ SOLN
INTRAMUSCULAR | Status: AC
  Filled 2020-06-29: qty 5

## 2020-06-29 MED ORDER — ASCORBIC ACID 500 MG PO TABS
1000.0000 mg | ORAL_TABLET | Freq: Every day | ORAL | Status: DC
  Administered 2020-06-30: 1000 mg via ORAL
  Filled 2020-06-29 (×2): qty 2

## 2020-06-29 MED ORDER — OXYCODONE HCL 5 MG PO TABS
5.0000 mg | ORAL_TABLET | Freq: Once | ORAL | Status: AC | PRN
  Administered 2020-06-29: 5 mg via ORAL

## 2020-06-29 MED ORDER — ONDANSETRON HCL 4 MG/2ML IJ SOLN
INTRAMUSCULAR | Status: DC | PRN
  Administered 2020-06-29: 4 mg via INTRAVENOUS

## 2020-06-29 MED ORDER — ORAL CARE MOUTH RINSE: 15.0000 mL | Freq: Once | OROMUCOSAL | Status: AC

## 2020-06-29 MED ORDER — CHLORHEXIDINE GLUCONATE 0.12 % MT SOLN: 15.0000 mL | Freq: Once | OROMUCOSAL | Status: AC

## 2020-06-29 MED ORDER — BUPIVACAINE HCL (PF) 0.25 % IJ SOLN
INTRAMUSCULAR | Status: DC | PRN
  Administered 2020-06-29: 10 mL

## 2020-06-29 MED ORDER — CELECOXIB 200 MG PO CAPS
200.0000 mg | ORAL_CAPSULE | Freq: Once | ORAL | Status: AC
  Filled 2020-06-29: qty 1

## 2020-06-29 MED ORDER — ONDANSETRON HCL 4 MG/2ML IJ SOLN: 4.0000 mg | Freq: Once | INTRAMUSCULAR | Status: DC | PRN

## 2020-06-29 MED ORDER — MIDAZOLAM HCL 2 MG/2ML IJ SOLN
INTRAMUSCULAR | Status: AC
  Filled 2020-06-29: qty 2

## 2020-06-29 MED ORDER — MIDAZOLAM HCL 2 MG/2ML IJ SOLN
INTRAMUSCULAR | Status: DC | PRN
  Administered 2020-06-29: 1 mg via INTRAVENOUS

## 2020-06-29 MED ORDER — DEXMEDETOMIDINE (PRECEDEX) IN NS 20 MCG/5ML (4 MCG/ML) IV SYRINGE
PREFILLED_SYRINGE | INTRAVENOUS | Status: DC | PRN
  Administered 2020-06-29: 8 ug via INTRAVENOUS
  Administered 2020-06-29 (×3): 4 ug via INTRAVENOUS

## 2020-06-29 MED ORDER — OXYCODONE HCL 5 MG PO TABS
10.0000 mg | ORAL_TABLET | ORAL | Status: DC | PRN
  Administered 2020-06-30 (×3): 10 mg via ORAL
  Filled 2020-06-29 (×4): qty 2

## 2020-06-29 MED ORDER — PHENYLEPHRINE 40 MCG/ML (10ML) SYRINGE FOR IV PUSH (FOR BLOOD PRESSURE SUPPORT)
PREFILLED_SYRINGE | INTRAVENOUS | Status: DC | PRN
  Administered 2020-06-29: 80 ug via INTRAVENOUS
  Administered 2020-06-29: 160 ug via INTRAVENOUS
  Administered 2020-06-29: 80 ug via INTRAVENOUS
  Administered 2020-06-29: 120 ug via INTRAVENOUS
  Administered 2020-06-29: 160 ug via INTRAVENOUS
  Administered 2020-06-29: 80 ug via INTRAVENOUS
  Administered 2020-06-29: 120 ug via INTRAVENOUS

## 2020-06-29 MED ORDER — LIDOCAINE 2% (20 MG/ML) 5 ML SYRINGE
INTRAMUSCULAR | Status: AC
  Filled 2020-06-29: qty 5

## 2020-06-29 MED ORDER — ONDANSETRON 4 MG PO TBDP: 8.0000 mg | ORAL_TABLET | Freq: Three times a day (TID) | ORAL | Status: DC | PRN

## 2020-06-29 MED ORDER — CHLORHEXIDINE GLUCONATE CLOTH 2 % EX PADS: 6.0000 | MEDICATED_PAD | Freq: Once | CUTANEOUS | Status: DC

## 2020-06-29 MED ORDER — ONDANSETRON HCL 4 MG/2ML IJ SOLN
INTRAMUSCULAR | Status: AC
  Filled 2020-06-29: qty 2

## 2020-06-29 MED ORDER — PROPOFOL 10 MG/ML IV BOLUS
INTRAVENOUS | Status: DC | PRN
  Administered 2020-06-29: 150 mg via INTRAVENOUS

## 2020-06-29 MED ORDER — LIDOCAINE-EPINEPHRINE (PF) 1.5 %-1:200000 IJ SOLN
INTRAMUSCULAR | Status: DC | PRN
  Administered 2020-06-29: 30 mL via PERINEURAL

## 2020-06-29 MED ORDER — FENTANYL CITRATE (PF) 100 MCG/2ML IJ SOLN
INTRAMUSCULAR | Status: AC
  Administered 2020-06-29: 50 ug via INTRAVENOUS
  Filled 2020-06-29: qty 2

## 2020-06-29 MED ORDER — 0.9 % SODIUM CHLORIDE (POUR BTL) OPTIME
TOPICAL | Status: DC | PRN
  Administered 2020-06-29: 1000 mL

## 2020-06-29 MED ORDER — OXYCODONE HCL 5 MG PO TABS
ORAL_TABLET | ORAL | Status: AC
  Filled 2020-06-29: qty 1

## 2020-06-29 MED ORDER — LACTATED RINGERS IV SOLN: INTRAVENOUS | Status: DC

## 2020-06-29 MED ORDER — ACETAMINOPHEN 500 MG PO TABS
1000.0000 mg | ORAL_TABLET | Freq: Once | ORAL | Status: AC
  Administered 2020-06-29: 1000 mg via ORAL
  Filled 2020-06-29: qty 2

## 2020-06-29 MED ORDER — CEFAZOLIN SODIUM-DEXTROSE 1-4 GM/50ML-% IV SOLN
1.0000 g | Freq: Three times a day (TID) | INTRAVENOUS | Status: DC
  Administered 2020-06-29 – 2020-06-30 (×3): 1 g via INTRAVENOUS
  Filled 2020-06-29 (×4): qty 50

## 2020-06-29 MED ORDER — CELECOXIB 200 MG PO CAPS
ORAL_CAPSULE | ORAL | Status: AC
  Administered 2020-06-29: 200 mg via ORAL
  Filled 2020-06-29: qty 1

## 2020-06-29 SURGICAL SUPPLY — 63 items
BIT DRILL 2.8 QUICK RELEASE (BIT) IMPLANT
BLADE CLIPPER SURG (BLADE) IMPLANT
BNDG CMPR 9X4 STRL LF SNTH (GAUZE/BANDAGES/DRESSINGS) ×1
BNDG ELASTIC 3X5.8 VLCR STR LF (GAUZE/BANDAGES/DRESSINGS) ×2 IMPLANT
BNDG ELASTIC 4X5.8 VLCR STR LF (GAUZE/BANDAGES/DRESSINGS) ×2 IMPLANT
BNDG ESMARK 4X9 LF (GAUZE/BANDAGES/DRESSINGS) ×2 IMPLANT
BNDG GAUZE ELAST 4 BULKY (GAUZE/BANDAGES/DRESSINGS) ×2 IMPLANT
CANISTER SUCT 3000ML PPV (MISCELLANEOUS) ×2 IMPLANT
CORD BIPOLAR FORCEPS 12FT (ELECTRODE) ×2 IMPLANT
COVER SURGICAL LIGHT HANDLE (MISCELLANEOUS) ×2 IMPLANT
COVER WAND RF STERILE (DRAPES) ×2 IMPLANT
CUFF TOURN SGL QUICK 18X4 (TOURNIQUET CUFF) ×2 IMPLANT
CUFF TOURN SGL QUICK 24 (TOURNIQUET CUFF)
CUFF TRNQT CYL 24X4X16.5-23 (TOURNIQUET CUFF) IMPLANT
DRAIN TLS ROUND 10FR (DRAIN) IMPLANT
DRAPE OEC MINIVIEW 54X84 (DRAPES) ×2 IMPLANT
DRAPE SURG 17X23 STRL (DRAPES) ×2 IMPLANT
DRILL 2.8 QUICK RELEASE (BIT) ×2
DRSG ADAPTIC 3X8 NADH LF (GAUZE/BANDAGES/DRESSINGS) ×1 IMPLANT
DRSG XEROFORM 1X8 (GAUZE/BANDAGES/DRESSINGS) ×1 IMPLANT
GAUZE SPONGE 4X4 12PLY STRL (GAUZE/BANDAGES/DRESSINGS) ×2 IMPLANT
GAUZE XEROFORM 1X8 LF (GAUZE/BANDAGES/DRESSINGS) ×2 IMPLANT
GLOVE SS BIOGEL STRL SZ 8 (GLOVE) ×1 IMPLANT
GLOVE SUPERSENSE BIOGEL SZ 8 (GLOVE) ×1
GOWN STRL REUS W/ TWL LRG LVL3 (GOWN DISPOSABLE) ×1 IMPLANT
GOWN STRL REUS W/ TWL XL LVL3 (GOWN DISPOSABLE) ×1 IMPLANT
GOWN STRL REUS W/TWL LRG LVL3 (GOWN DISPOSABLE) ×2
GOWN STRL REUS W/TWL XL LVL3 (GOWN DISPOSABLE) ×2
KIT BASIN OR (CUSTOM PROCEDURE TRAY) ×2 IMPLANT
KIT TURNOVER KIT B (KITS) ×2 IMPLANT
LOOP VESSEL MAXI BLUE (MISCELLANEOUS) IMPLANT
MANIFOLD NEPTUNE II (INSTRUMENTS) ×2 IMPLANT
NEEDLE 22X1 1/2 (OR ONLY) (NEEDLE) IMPLANT
NS IRRIG 1000ML POUR BTL (IV SOLUTION) ×2 IMPLANT
PACK ORTHO EXTREMITY (CUSTOM PROCEDURE TRAY) ×2 IMPLANT
PAD ARMBOARD 7.5X6 YLW CONV (MISCELLANEOUS) ×4 IMPLANT
PAD CAST 3X4 CTTN HI CHSV (CAST SUPPLIES) ×1 IMPLANT
PAD CAST 4YDX4 CTTN HI CHSV (CAST SUPPLIES) ×1 IMPLANT
PADDING CAST COTTON 3X4 STRL (CAST SUPPLIES) ×2
PADDING CAST COTTON 4X4 STRL (CAST SUPPLIES) ×2
PLATE L LCKG WIDE VDR (Plate) IMPLANT
PLATE L WIDE VDR (Plate) ×2 IMPLANT
SCREW BONE CORT 3.5X17MM HEXA (Screw) IMPLANT
SCREW CORTICAL 3.5X17 (Screw) ×2 IMPLANT
SCREW HEX 3.5X15 NLCKG STRL (Screw) IMPLANT
SCREW HEX 3.5X15MM (Screw) ×2 IMPLANT
SCREW HEXALOBE NON-LOCK 3.5X14 (Screw) ×1 IMPLANT
SCREW HEXALOBE NON-LOCK 3.5X16 (Screw) ×2 IMPLANT
SCREW NONLOCK HEX 3.5X12 (Screw) ×1 IMPLANT
SOL PREP POV-IOD 4OZ 10% (MISCELLANEOUS) ×4 IMPLANT
SPLINT FIBERGLASS 3X35 (CAST SUPPLIES) ×1 IMPLANT
SPONGE LAP 4X18 RFD (DISPOSABLE) IMPLANT
SUT MNCRL AB 4-0 PS2 18 (SUTURE) ×2 IMPLANT
SUT PROLENE 3 0 PS 2 (SUTURE) IMPLANT
SUT VIC AB 3-0 FS2 27 (SUTURE) IMPLANT
SYR CONTROL 10ML LL (SYRINGE) IMPLANT
SYSTEM CHEST DRAIN TLS 7FR (DRAIN) IMPLANT
TOWEL GREEN STERILE (TOWEL DISPOSABLE) ×2 IMPLANT
TOWEL GREEN STERILE FF (TOWEL DISPOSABLE) ×2 IMPLANT
TUBE CONNECTING 12X1/4 (SUCTIONS) ×2 IMPLANT
TUBE EVACUATION TLS (MISCELLANEOUS) ×2 IMPLANT
UNDERPAD 30X36 HEAVY ABSORB (UNDERPADS AND DIAPERS) ×2 IMPLANT
WATER STERILE IRR 1000ML POUR (IV SOLUTION) ×2 IMPLANT

## 2020-06-29 NOTE — Plan of Care (Signed)
  Problem: Clinical Measurements: Goal: Ability to maintain clinical measurements within normal limits will improve Outcome: Progressing Goal: Will remain free from infection Outcome: Progressing Goal: Diagnostic test results will improve Outcome: Progressing Goal: Cardiovascular complication will be avoided Outcome: Progressing   Problem: Activity: Goal: Risk for activity intolerance will decrease Outcome: Progressing   Problem: Elimination: Goal: Will not experience complications related to bowel motility Outcome: Progressing Goal: Will not experience complications related to urinary retention Outcome: Progressing   Problem: Pain Managment: Goal: General experience of comfort will improve Outcome: Progressing   Problem: Safety: Goal: Ability to remain free from injury will improve Outcome: Progressing   Problem: Skin Integrity: Goal: Risk for impaired skin integrity will decrease Outcome: Progressing

## 2020-06-29 NOTE — Anesthesia Postprocedure Evaluation (Signed)
Anesthesia Post Note  Patient: [name]  Procedure(s) Performed: OPEN REDUCTION INTERNAL FIXATION (ORIF) WRIST FRACTURE (Left Wrist)     Patient location during evaluation: PACU Anesthesia Type: General Level of consciousness: awake and alert Pain management: pain level controlled Vital Signs Assessment: post-procedure vital signs reviewed and stable Respiratory status: spontaneous breathing, nonlabored ventilation, respiratory function stable and patient connected to nasal cannula oxygen Cardiovascular status: stable and blood pressure returned to baseline Postop Assessment: no apparent nausea or vomiting Anesthetic complications: no   No complications documented.  Last Vitals:  Vitals:   06/29/20 1147 06/29/20 1650  BP:    Pulse: 69   Resp:  13  Temp:  (!) 36.3 C  SpO2:      Last Pain:  Vitals:   06/29/20 1700  TempSrc:   PainSc: 10-Worst pain ever                 Tiajuana Amass

## 2020-06-29 NOTE — Anesthesia Procedure Notes (Signed)
Procedure Name: LMA Insertion Date/Time: 06/29/2020 3:22 PM Performed by: Leonor Liv, CRNA Pre-anesthesia Checklist: Patient identified, Emergency Drugs available, Suction available and Patient being monitored Patient Re-evaluated:Patient Re-evaluated prior to induction Oxygen Delivery Method: Circle System Utilized Preoxygenation: Pre-oxygenation with 100% oxygen Induction Type: IV induction Ventilation: Mask ventilation without difficulty LMA: LMA inserted LMA Size: 5.0 Number of attempts: 1 Placement Confirmation: positive ETCO2 Tube secured with: Tape Dental Injury: Teeth and Oropharynx as per pre-operative assessment
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
## 2020-06-29 NOTE — H&P (Signed)
  Patient presents for evaluation treatment and surgical management.  We will plan for open reduction internal fixation left wrist and repair is necessary with possible spanning plate.  All questions have been addressed.  We will proceed accordingly.  We are planning surgery for your upper extremity. The risk and benefits of surgery to include risk of bleeding, infection, anesthesia,  damage to normal structures and failure of the surgery to accomplish its intended goals of relieving symptoms and restoring function have been discussed in detail. With this in mind we plan to proceed. I have specifically discussed with the patient the pre-and postoperative regime and the dos and don'ts and risk and benefits in great detail. Risk and benefits of surgery also include risk of dystrophy(CRPS), chronic nerve pain, failure of the healing process to go onto completion and other inherent risks of surgery The relavent the pathophysiology of the disease/injury process, as well as the alternatives for treatment and postoperative course of action has been discussed in great detail with the patient who desires to proceed.  We will do everything in our power to help you (the patient) restore function to the upper extremity. It is a pleasure to see this patient today. Mavi Un MD

## 2020-06-29 NOTE — Progress Notes (Signed)
Patient left floor for surgery in bed with normal saline infusing.  Theodoro Parma, RN 06/29/20, 1330

## 2020-06-29 NOTE — Transfer of Care (Signed)
Immediate Anesthesia Transfer of Care Note  Patient: Amore Grater Deziel  Procedure(s) Performed: OPEN REDUCTION INTERNAL FIXATION (ORIF) WRIST FRACTURE (Left Wrist)  Patient Location: PACU  Anesthesia Type:General  Level of Consciousness: sedated  Airway & Oxygen Therapy: Patient Spontanous Breathing  Post-op Assessment: Report given to RN, Post -op Vital signs reviewed and stable and Patient moving all extremities  Post vital signs: Reviewed and stable  Last Vitals:  Vitals Value Taken Time  BP 125/78 06/29/20 1649  Temp    Pulse 56 06/29/20 1655  Resp 12 06/29/20 1655  SpO2 97 % 06/29/20 1655  Vitals shown include unvalidated device data.  Last Pain:  Vitals:   06/29/20 1144  TempSrc: Oral  PainSc:          Complications: No complications documented.

## 2020-06-29 NOTE — Anesthesia Procedure Notes (Signed)
Anesthesia Regional Block: Supraclavicular block   Pre-Anesthetic Checklist: ,, timeout performed, Correct Patient, Correct Site, Correct Laterality, Correct Procedure, Correct Position, site marked, Risks and benefits discussed,  Surgical consent,  Pre-op evaluation,  At surgeon's request and post-op pain management  Laterality: Left  Prep: chloraprep       Needles:  Injection technique: Single-shot  Needle Type: Echogenic Stimulator Needle     Needle Length: 5cm  Needle Gauge: 22     Additional Needles:   Narrative:  Start time: 06/29/2020 2:33 PM End time: 06/29/2020 2:23 PM Injection made incrementally with aspirations every 5 mL.  Performed by: Personally  Anesthesiologist: Duane Boston, MD  Additional Notes: Functioning IV was confirmed and monitors applied.  A 59mm 22ga echogenic arrow stimulator was used. Sterile prep and drape,hand hygiene and sterile gloves were used.Ultrasound guidance: relevant anatomy identified, needle position confirmed, local anesthetic spread visualized around nerve(s)., vascular puncture avoided.  Image printed for medical record.  Negative aspiration and negative test dose prior to incremental administration of local anesthetic. The patient tolerated the procedure well.

## 2020-06-29 NOTE — Progress Notes (Addendum)
Subjective:   Patient states he has mild pain at rest due to hamstring injuries. He feels like he is making good progress with PT. States he does have continued moderate pain with walking.  Objective:  PE: VITALS:   Vitals:   06/29/20 0734 06/29/20 0924 06/29/20 1144 06/29/20 1147  BP: 110/79 112/76 (!) 110/52   Pulse: 68 99 51 69  Resp: 16  18   Temp: 98.2 F (36.8 C)  97.8 F (36.6 C)   TempSrc: Oral  Oral   SpO2: 95%  96%    General: sitting up in chair, in no acute distress MSK: 0-110 degrees of bilateral knee ROM. Able to activate hamstrings against resistance bilaterally with mild pain. EHL and FHL Intact bilaterally. Distal sensation intact. 2+ DP pulses. No ecchymosis noted on bilateral thighs.  LABS  Results for orders placed or performed during the hospital encounter of 06/25/20 (from the past 24 hour(s))  Glucose, capillary     Status: Abnormal   Collection Time: 06/28/20  4:19 PM  Result Value Ref Range   Glucose-Capillary 132 (H) 70 - 99 mg/dL   Comment 1 Notify RN    Comment 2 Document in Chart   Glucose, capillary     Status: Abnormal   Collection Time: 06/28/20  9:59 PM  Result Value Ref Range   Glucose-Capillary 137 (H) 70 - 99 mg/dL  Surgical PCR screen     Status: None   Collection Time: 06/29/20 12:40 AM   Specimen: Nasal Mucosa; Nasal Swab  Result Value Ref Range   MRSA, PCR NEGATIVE NEGATIVE   Staphylococcus aureus NEGATIVE NEGATIVE  CBC     Status: None   Collection Time: 06/29/20  5:11 AM  Result Value Ref Range   WBC 6.6 4.0 - 10.5 K/uL   RBC 4.80 4.22 - 5.81 MIL/uL   Hemoglobin 14.7 13.0 - 17.0 g/dL   HCT 44.1 39 - 52 %   MCV 91.9 80.0 - 100.0 fL   MCH 30.6 26.0 - 34.0 pg   MCHC 33.3 30.0 - 36.0 g/dL   RDW 13.7 11.5 - 15.5 %   Platelets 158 150 - 400 K/uL   nRBC 0.0 0.0 - 0.2 %  Basic metabolic panel     Status: Abnormal   Collection Time: 06/29/20  5:11 AM  Result Value Ref Range   Sodium 136 135 - 145 mmol/L   Potassium 4.1  3.5 - 5.1 mmol/L   Chloride 102 98 - 111 mmol/L   CO2 24 22 - 32 mmol/L   Glucose, Bld 166 (H) 70 - 99 mg/dL   BUN 22 8 - 23 mg/dL   Creatinine, Ser 1.09 0.61 - 1.24 mg/dL   Calcium 8.5 (L) 8.9 - 10.3 mg/dL   GFR calc non Af Amer >60 >60 mL/min   GFR calc Af Amer >60 >60 mL/min   Anion gap 10 5 - 15  Glucose, capillary     Status: Abnormal   Collection Time: 06/29/20  6:12 AM  Result Value Ref Range   Glucose-Capillary 159 (H) 70 - 99 mg/dL  Glucose, capillary     Status: Abnormal   Collection Time: 06/29/20 11:34 AM  Result Value Ref Range   Glucose-Capillary 121 (H) 70 - 99 mg/dL   Comment 1 Notify RN    Comment 2 Document in Chart     No results found.  Assessment/Plan: Complete rupture of right proximal hamstring with minimal retraction High-grade partial tear of left proximal  hamstring - continue WBAT BLE and mobilization with PT - will place order for HHPT on discharge - Plan to follow-up 2 weeks after discharge, he is welcome to follow up either in our office with Dr. Mardelle Matte or with Dr. Erlinda Hong upon discharge - orthopedics signing off regarding hamstring tears, okay for discharge from our standpoint whenever deemed appropriate by hand surgery and medicine teams   Contact information:   Weekdays 8-5 Merlene Pulling, PA-C [phone] A fter hours and holidays please check [email or web address] for group call information for [facility]  [name] 06/29/2020, 2:27 PM

## 2020-06-29 NOTE — Op Note (Signed)
Operative note 06/29/2020  [name]  Preoperative diagnosis comminuted complex intra-articular volar Barton's shear fracture with carpal subluxation  Postop diagnosis: The same  Procedure #1 open reduction internal fixation with Acumed volar plate secondary to comminuted complex greater than 3 part intra-articular fracture left distal radius #2 AP lateral oblique x-rays performed examined and interpreted by myself left wrist  Surgeon Ramla Hase MD  Anesthesia block with LMA general   This member loss minimal  Tourniquet time less than an hour  Drains none  Description of procedure: Patient is a 79 year old male with a significant fracture.  He has volar subluxation of the carpus and an unstable fracture pattern with the volar rim sheared off of the radius.  I discussed with him risk and benefits of surgery including possible spanning wrist plate.  With this in mind he desires to proceed.  Patient was taken to the operative theater and underwent smooth induction of anesthesia in the form of IV sedation during the prep he was very tender thus we employed a general LMA anesthetic by the anesthesia department.  I performed a Hibiclens scrub followed by 10-minute surgical Betadine scrub which I perform myself with the arm held in traction.  Following this sterile field was secured and timeout was observed.  Following this Ancef was given and the operation commenced with insufflation of the tourniquet followed by volar radial incision about the wrist.  FCR tendon sheath was incised carpal canal contents retracted ulnarly and the pronator was then incised.  At this juncture I then placed him in fingertrap traction and set about reassembling the volar rim reassembled the volar rim which was a comminuted complex grade 3 part volar Barton's equivalent.  I then placed the plate for stability I did recognize that the plate would have to be a slight bit proud to get the fixation that we want but  nevertheless I felt this was absolutely necessary and if we need to take the plate out in the future we can.  I discussed these issues with him preoperatively of course.  Following this I then performed live fluoroscopy and made sure the distal radial ulnar joint and radiocarpal as well as midcarpal mechanics looked excellent.  The patient tolerated this well.  I then irrigated copiously closed the pronator.  I have actually very good coverage over the plate but we will watch this closely for any flexor tendon impingement.  Following pronator closure further irrigation followed by tourniquet deflation and wound closure was accomplished.  Standard dressing and sugar tong splint was applied.  He will be admitted for IV antibiotics general postop observation and continued rehabilitation of his hamstrings tear and other issues.  There were no immediate complications I discussed all issues with his son [name] who I know quite well.   Lemon Sternberg MD
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
## 2020-06-29 NOTE — Anesthesia Preprocedure Evaluation (Addendum)
Anesthesia Evaluation  Patient identified by MRN, date of birth, ID band Patient awake    Reviewed: Allergy & Precautions, NPO status , Patient's Chart, lab work & pertinent test results, reviewed documented beta blocker date and time   History of Anesthesia Complications (+) history of anesthetic complications  Airway Mallampati: III  TM Distance: >3 FB Neck ROM: Full    Dental  (+) Partial Upper, Dental Advisory Given,    Pulmonary sleep apnea and Continuous Positive Airway Pressure Ventilation , former smoker,    Pulmonary exam normal breath sounds clear to auscultation       Cardiovascular hypertension, Pt. on medications and Pt. on home beta blockers + CAD and + Past MI  Normal cardiovascular exam Rhythm:Regular Rate:Normal  Echo 12/02/17 Left ventricle: The cavity size was normal. There was moderate concentric hypertrophy. Systolic function was mildly reduced. The estimated ejection fraction was in the range of 45% to 50%. Hypokinesis of the inferolateral and inferior myocardium. Features are consistent with a pseudonormal left ventricular filling pattern, with concomitant abnormal relaxation and increased filling pressure (grade 2 diastolic dysfunction). Doppler parameters are consistent with high ventricular filling pressure.  - Aortic valve: Transvalvular velocity was within the normal range. There was no stenosis. There was no regurgitation.  - Mitral valve: Transvalvular velocity was within the normal range. There was no evidence for stenosis. There was mild regurgitation.  - Left atrium: The atrium was moderately dilated.  - Right ventricle: The cavity size was normal. Wall thickness was normal. Systolic function was normal.  - Right atrium: The atrium was moderately dilated.  - Tricuspid valve: There was mild regurgitation.  - Pulmonary arteries: Systolic pressure was within the normal range. PA peak pressure: 31 mm Hg  (S).   EKG 06/26/20 NSR, Lateral T wave inversion unchanged from 2018   Neuro/Psych  Neuromuscular disease negative psych ROS   GI/Hepatic hiatal hernia, GERD  Medicated and Controlled,  Endo/Other  diabetes, Poorly Controlled, Type 2, Oral Hypoglycemic AgentsHyperlipidemia  Renal/GU Renal InsufficiencyRenal disease  negative genitourinary   Musculoskeletal  (+) Arthritis , Osteoarthritis,  Left distal radius Fx   Abdominal   Peds  Hematology   Anesthesia Other Findings   Reproductive/Obstetrics                            Anesthesia Physical Anesthesia Plan  ASA: III  Anesthesia Plan: MAC   Post-op Pain Management:  Regional for Post-op pain   Induction: Intravenous  PONV Risk Score and Plan: 3 and Ondansetron and Propofol infusion  Airway Management Planned: Natural Airway  Additional Equipment:   Intra-op Plan:   Post-operative Plan:   Informed Consent: I have reviewed the patients History and Physical, chart, labs and discussed the procedure including the risks, benefits and alternatives for the proposed anesthesia with the patient or authorized representative who has indicated his/her understanding and acceptance.     Dental advisory given  Plan Discussed with: CRNA, Anesthesiologist and Surgeon  Anesthesia Plan Comments:        Anesthesia Quick Evaluation

## 2020-06-30 DIAGNOSIS — I25119 Atherosclerotic heart disease of native coronary artery with unspecified angina pectoris: Secondary | ICD-10-CM | POA: Diagnosis not present

## 2020-06-30 DIAGNOSIS — I4891 Unspecified atrial fibrillation: Secondary | ICD-10-CM

## 2020-06-30 DIAGNOSIS — N182 Chronic kidney disease, stage 2 (mild): Secondary | ICD-10-CM | POA: Diagnosis not present

## 2020-06-30 DIAGNOSIS — E785 Hyperlipidemia, unspecified: Secondary | ICD-10-CM

## 2020-06-30 DIAGNOSIS — I10 Essential (primary) hypertension: Secondary | ICD-10-CM

## 2020-06-30 LAB — BASIC METABOLIC PANEL
Anion gap: 9 (ref 5–15)
BUN: 29 mg/dL — ABNORMAL HIGH (ref 8–23)
CO2: 25 mmol/L (ref 22–32)
Calcium: 8.4 mg/dL — ABNORMAL LOW (ref 8.9–10.3)
Chloride: 101 mmol/L (ref 98–111)
Creatinine, Ser: 1.34 mg/dL — ABNORMAL HIGH (ref 0.61–1.24)
GFR calc Af Amer: 59 mL/min — ABNORMAL LOW (ref >60)
GFR calc non Af Amer: 51 mL/min — ABNORMAL LOW (ref >60)
Glucose, Bld: 106 mg/dL — ABNORMAL HIGH (ref 70–99)
Potassium: 3.7 mmol/L (ref 3.5–5.1)
Sodium: 135 mmol/L (ref 135–145)

## 2020-06-30 LAB — GLUCOSE, CAPILLARY
Glucose-Capillary: 139 mg/dL — ABNORMAL HIGH (ref 70–99)
Glucose-Capillary: 172 mg/dL — ABNORMAL HIGH (ref 70–99)
Glucose-Capillary: 172 mg/dL — ABNORMAL HIGH (ref 70–99)

## 2020-06-30 LAB — CBC
HCT: 44.4 % (ref 39.0–52.0)
Hemoglobin: 14.2 g/dL (ref 13.0–17.0)
MCH: 30 pg (ref 26.0–34.0)
MCHC: 32 g/dL (ref 30.0–36.0)
MCV: 93.9 fL (ref 80.0–100.0)
Platelets: 161 10*3/uL (ref 150–400)
RBC: 4.73 MIL/uL (ref 4.22–5.81)
RDW: 13.8 % (ref 11.5–15.5)
WBC: 7.7 10*3/uL (ref 4.0–10.5)
nRBC: 0 % (ref 0.0–0.2)

## 2020-06-30 MED ORDER — POLYETHYLENE GLYCOL 3350 17 G PO PACK: 17.0000 g | PACK | Freq: Every day | ORAL | 0 refills | Status: DC | PRN

## 2020-06-30 MED ORDER — OXYCODONE-ACETAMINOPHEN 5-325 MG PO TABS: 1.0000 | ORAL_TABLET | Freq: Four times a day (QID) | ORAL | 0 refills | Status: DC | PRN
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
## 2020-06-30 NOTE — Discharge Instructions (Signed)
Please remember to elevate move massage her fingers.  Please call for any problems.  If any issues arise please notify Dr. Amedeo Plenty.  Your son [name] has Dr. Vanetta Shawl personal cell phone number and you can always call the office as well.  Do not put any weight on the left wrist.  We recommend healthy living habitus and a slow progression towards activity.

## 2020-06-30 NOTE — Progress Notes (Signed)
Orthopedic Tech Progress Note Patient Details:  [name] 1941-03-08 [id number] Left shoulder sling bedside for patient Patient ID: KHANG HANNUM, male   DOB: [birth year], 79 y.o.   MRN: [id number]   Chip Boer 06/30/2020, 1:45 PM

## 2020-06-30 NOTE — Progress Notes (Signed)
Patient ID: [name], male   DOB: Sep 12, 1941, 79 y.o.   MRN: [id number] Patient looks absolutely wonderful today.  Range of motion to the fingers looks excellent.  He is in good spirits.  He feels better with his ambulatory capacity.  He is intact to sensation and motor function about the fingers.  I am quite pleased to see this.  At present juncture we will plan for elevation range of motion and I do feel he is stable to transition to home.  We discussed all issues.  If physical therapy clears him for transition to home I feel that moving home today is appropriate.  I would recommend pain management in the form of oxycodone etc. and do not feel that he will need any further antibiotics from my standpoint.  I will have my office reach out to him and we will see him back in 2 weeks to 2-1/2 weeks for his follow-up and we discussed this at length at bedside.  Once again he looks great.  He is ready to transition home from my standpoint and I will have my office arrange for follow-up.  Thanks to all for the excellent care  Jeremey Bascom MD

## 2020-07-02 NOTE — Progress Notes (Signed)
Nurse gave 50 mg of fentanyl Nurse wasted 50mg  of fentanyl witnessed by [employer] 

## 2020-07-03 ENCOUNTER — Encounter (HOSPITAL_COMMUNITY): Payer: Self-pay | Admitting: Orthopedic Surgery

## 2020-08-21 ENCOUNTER — Encounter: Payer: Medicare HMO | Admitting: *Deleted

## 2020-08-21 ENCOUNTER — Other Ambulatory Visit: Payer: Self-pay

## 2020-08-21 DIAGNOSIS — Z006 Encounter for examination for normal comparison and control in clinical research program: Secondary | ICD-10-CM

## 2020-08-21 NOTE — Research (Signed)
Pt to research clinic for visit 6 in the [place] 4 study. No AE/ SAE or con medication changes to report. IP injection given in right ABD. Pt tolerated well. Next appointment scheduled for February 05, 2021 at 1400.

## 2020-11-13 ENCOUNTER — Ambulatory Visit
Admission: RE | Admit: 2020-11-13 | Discharge: 2020-11-13 | Disposition: A | Discharge status: Home / Self Care | Payer: Medicare HMO | Source: Ambulatory Visit | Attending: Internal Medicine | Admitting: Internal Medicine

## 2020-11-13 ENCOUNTER — Other Ambulatory Visit: Payer: Self-pay | Admitting: Internal Medicine

## 2020-11-13 DIAGNOSIS — R059 Cough, unspecified: Secondary | ICD-10-CM

## 2020-12-27 ENCOUNTER — Ambulatory Visit
Admission: RE | Admit: 2020-12-27 | Discharge: 2020-12-27 | Disposition: A | Discharge status: Home / Self Care | Payer: Medicare HMO | Source: Ambulatory Visit | Attending: Internal Medicine | Admitting: Internal Medicine

## 2020-12-27 ENCOUNTER — Other Ambulatory Visit: Payer: Self-pay | Admitting: Internal Medicine

## 2020-12-27 DIAGNOSIS — R0781 Pleurodynia: Secondary | ICD-10-CM

## 2021-02-05 ENCOUNTER — Other Ambulatory Visit: Payer: Self-pay

## 2021-02-05 ENCOUNTER — Encounter: Payer: Medicare HMO | Admitting: *Deleted

## 2021-02-05 DIAGNOSIS — Z006 Encounter for examination for normal comparison and control in clinical research program: Secondary | ICD-10-CM

## 2021-02-05 NOTE — Research (Signed)
Subject Name: [name] [facility]  Subject met inclusion and exclusion criteria.  The informed consent form, study requirements and expectations were reviewed with the subject and questions and concerns were addressed prior to the signing of the consent form.  The subject verbalized understanding of the trial requirements.  The subject agreed to participate in the [place] 4 trial and signed the informed consent at 1142 on 02/05/21  The informed consent was obtained prior to performance of any protocol-specific procedures for the subject.  A copy of the signed informed consent was given to the subject and a copy was placed in the subject's medical record.   Preston Fleeting C   This subject came in to the research clinic today for their [facility] 4 Follow-up appointment. Before we began their appointment, the patient was consented to the addendum consent for the orion 4 research study. Subject was given their IP injection into their Left Abdomen and tolerated it well. There were no new AE's or SAE's to report at this time and all of their concomitant medications have been reviewed and updated if applicable. Next appointment scheduled for Tuesday, September 20th, 2022 @ 0900am.

## 2021-03-12 ENCOUNTER — Other Ambulatory Visit: Payer: Self-pay

## 2021-03-12 ENCOUNTER — Encounter (HOSPITAL_BASED_OUTPATIENT_CLINIC_OR_DEPARTMENT_OTHER): Payer: Self-pay | Admitting: *Deleted

## 2021-03-12 ENCOUNTER — Emergency Department (HOSPITAL_BASED_OUTPATIENT_CLINIC_OR_DEPARTMENT_OTHER): Payer: Medicare HMO

## 2021-03-12 ENCOUNTER — Emergency Department (HOSPITAL_BASED_OUTPATIENT_CLINIC_OR_DEPARTMENT_OTHER)
Admission: EM | Admit: 2021-03-12 | Discharge: 2021-03-12 | Disposition: A | Discharge status: Home / Self Care | Payer: Medicare HMO | Attending: Emergency Medicine | Admitting: Emergency Medicine

## 2021-03-12 DIAGNOSIS — Z79899 Other long term (current) drug therapy: Secondary | ICD-10-CM | POA: Diagnosis not present

## 2021-03-12 DIAGNOSIS — E1122 Type 2 diabetes mellitus with diabetic chronic kidney disease: Secondary | ICD-10-CM | POA: Diagnosis not present

## 2021-03-12 DIAGNOSIS — S51011A Laceration without foreign body of right elbow, initial encounter: Secondary | ICD-10-CM

## 2021-03-12 DIAGNOSIS — I2510 Atherosclerotic heart disease of native coronary artery without angina pectoris: Secondary | ICD-10-CM | POA: Insufficient documentation

## 2021-03-12 DIAGNOSIS — N182 Chronic kidney disease, stage 2 (mild): Secondary | ICD-10-CM | POA: Insufficient documentation

## 2021-03-12 DIAGNOSIS — Z23 Encounter for immunization: Secondary | ICD-10-CM | POA: Insufficient documentation

## 2021-03-12 DIAGNOSIS — S51021A Laceration with foreign body of right elbow, initial encounter: Secondary | ICD-10-CM | POA: Insufficient documentation

## 2021-03-12 DIAGNOSIS — S6991XA Unspecified injury of right wrist, hand and finger(s), initial encounter: Secondary | ICD-10-CM | POA: Diagnosis present

## 2021-03-12 DIAGNOSIS — S61431A Puncture wound without foreign body of right hand, initial encounter: Secondary | ICD-10-CM | POA: Insufficient documentation

## 2021-03-12 DIAGNOSIS — W1789XA Other fall from one level to another, initial encounter: Secondary | ICD-10-CM | POA: Insufficient documentation

## 2021-03-12 DIAGNOSIS — Z951 Presence of aortocoronary bypass graft: Secondary | ICD-10-CM | POA: Diagnosis not present

## 2021-03-12 DIAGNOSIS — Z87891 Personal history of nicotine dependence: Secondary | ICD-10-CM | POA: Insufficient documentation

## 2021-03-12 DIAGNOSIS — W1839XA Other fall on same level, initial encounter: Secondary | ICD-10-CM | POA: Diagnosis not present

## 2021-03-12 DIAGNOSIS — W19XXXA Unspecified fall, initial encounter: Secondary | ICD-10-CM

## 2021-03-12 DIAGNOSIS — I129 Hypertensive chronic kidney disease with stage 1 through stage 4 chronic kidney disease, or unspecified chronic kidney disease: Secondary | ICD-10-CM | POA: Diagnosis not present

## 2021-03-12 DIAGNOSIS — Z7984 Long term (current) use of oral hypoglycemic drugs: Secondary | ICD-10-CM | POA: Diagnosis not present

## 2021-03-12 MED ORDER — CEPHALEXIN 500 MG PO CAPS: 500.0000 mg | ORAL_CAPSULE | Freq: Two times a day (BID) | ORAL | 0 refills | Status: AC

## 2021-03-12 MED ORDER — LIDOCAINE-EPINEPHRINE (PF) 2 %-1:200000 IJ SOLN
20.0000 mL | Freq: Once | INTRAMUSCULAR | Status: AC
  Administered 2021-03-12: 20 mL
  Filled 2021-03-12: qty 20

## 2021-03-12 MED ORDER — TETANUS-DIPHTH-ACELL PERTUSSIS 5-2.5-18.5 LF-MCG/0.5 IM SUSY
0.5000 mL | PREFILLED_SYRINGE | Freq: Once | INTRAMUSCULAR | Status: AC
  Administered 2021-03-12: 0.5 mL via INTRAMUSCULAR
  Filled 2021-03-12: qty 0.5
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
## 2021-03-12 NOTE — ED Provider Notes (Addendum)
I personally evaluated the patient during the encounter and completed a history, physical, procedures, medical decision making to contribute to the overall care of the patientnd decision making for the patient briefly, the patient is a 80 y.o. male elbow laceration.  Patient fell injuring the back of his right elbow.  Laceration just to the proximal forearm.  X-ray does not show any fracture.  No joint effusion.  There is some gas within the dorsal soft tissue of the proximal forearm.  Wound does not probe down into the elbow joint.  It is just mostly in the proximal forearm.  No concern for laceration over the joint space.  No concern for open joint.  There is no fracture.  Hand x-ray also negative.  Overall patient has laceration to the forearm as well as a puncture wound to the hand.  Will be repaired by physician assistant.  We will have antibiotics and tetanus shot.  Will have him follow-up primary care doctor if needed.  Educated about wound care instructions.  Patient with good range of motion of the right upper extremity, neurovascular neuromuscularly intact.  This chart was dictated using voice recognition software.  Despite best efforts to proofread,  errors can occur which can change the documentation meaning.     EKG Interpretation None          Lennice Sites, DO 03/12/21 [place], Chaplin, DO 03/12/21 [phone]

## 2021-03-12 NOTE — ED Notes (Signed)
Report to Rebecca, RN.

## 2021-03-12 NOTE — Discharge Instructions (Signed)
Keep area clean by washing with soap and water daily. Do not submerge in water or scrub stitches Apply a bandage at least once daily, change more often if it is dirty Watch for signs of infection (redness, drainage, worsening pain) and please follow-up with your family doctor or come to the ER Take Tylenol or Ibuprofen for pain as needed Have stitches removed in 7-10 days Take the antibiotic as directed until finished Return to the ER for any new or worsening symptoms
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
## 2021-03-12 NOTE — ED Notes (Signed)
Wound to right arm and hand irrigated with sterile water.  Tolerated well.

## 2021-03-21 ENCOUNTER — Encounter (HOSPITAL_BASED_OUTPATIENT_CLINIC_OR_DEPARTMENT_OTHER): Payer: Self-pay | Admitting: *Deleted

## 2021-03-21 ENCOUNTER — Other Ambulatory Visit: Payer: Self-pay

## 2021-03-21 ENCOUNTER — Emergency Department (HOSPITAL_BASED_OUTPATIENT_CLINIC_OR_DEPARTMENT_OTHER)
Admission: EM | Admit: 2021-03-21 | Discharge: 2021-03-21 | Disposition: A | Discharge status: Home / Self Care | Payer: Medicare HMO | Attending: Emergency Medicine | Admitting: Emergency Medicine

## 2021-03-21 DIAGNOSIS — Z951 Presence of aortocoronary bypass graft: Secondary | ICD-10-CM | POA: Diagnosis not present

## 2021-03-21 DIAGNOSIS — Z79899 Other long term (current) drug therapy: Secondary | ICD-10-CM | POA: Diagnosis not present

## 2021-03-21 DIAGNOSIS — Z87891 Personal history of nicotine dependence: Secondary | ICD-10-CM | POA: Insufficient documentation

## 2021-03-21 DIAGNOSIS — W1789XD Other fall from one level to another, subsequent encounter: Secondary | ICD-10-CM | POA: Diagnosis not present

## 2021-03-21 DIAGNOSIS — E785 Hyperlipidemia, unspecified: Secondary | ICD-10-CM | POA: Insufficient documentation

## 2021-03-21 DIAGNOSIS — I129 Hypertensive chronic kidney disease with stage 1 through stage 4 chronic kidney disease, or unspecified chronic kidney disease: Secondary | ICD-10-CM | POA: Diagnosis not present

## 2021-03-21 DIAGNOSIS — E1169 Type 2 diabetes mellitus with other specified complication: Secondary | ICD-10-CM | POA: Insufficient documentation

## 2021-03-21 DIAGNOSIS — S51011D Laceration without foreign body of right elbow, subsequent encounter: Secondary | ICD-10-CM | POA: Diagnosis not present

## 2021-03-21 DIAGNOSIS — Z7984 Long term (current) use of oral hypoglycemic drugs: Secondary | ICD-10-CM | POA: Diagnosis not present

## 2021-03-21 DIAGNOSIS — N182 Chronic kidney disease, stage 2 (mild): Secondary | ICD-10-CM | POA: Diagnosis not present

## 2021-03-21 DIAGNOSIS — E1122 Type 2 diabetes mellitus with diabetic chronic kidney disease: Secondary | ICD-10-CM | POA: Insufficient documentation

## 2021-03-21 DIAGNOSIS — Z4802 Encounter for removal of sutures: Secondary | ICD-10-CM

## 2021-03-21 NOTE — ED Triage Notes (Signed)
Pt here for suture removal

## 2021-03-21 NOTE — Discharge Instructions (Signed)
Please make sure to finish your antibiotics.  Return to the ER for any new or worsening symptoms.
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
## 2021-08-20 ENCOUNTER — Other Ambulatory Visit: Payer: Self-pay

## 2021-08-20 DIAGNOSIS — Z006 Encounter for examination for normal comparison and control in clinical research program: Secondary | ICD-10-CM

## 2021-08-20 NOTE — Research (Signed)
Patient seen in clinic for ORION 4 trial. No changes in medications and no recent adverse events. IP medication given in the left lower quadrant of the abdomen. Patient tolerated without any complaints. Next follow up appoint will be February 18, 2022 @ 8:30 and fasting labs will be drawn then.

## 2021-08-23 ENCOUNTER — Telehealth: Payer: Self-pay

## 2021-08-23 NOTE — Telephone Encounter (Signed)
-----   Message from Sherren Mocha, MD sent at 08/23/2021  8:22 AM EDT ----- This patient called me last weekend and his having some cardiac problems (I know him personally - longtime patient). Please add him on to my clinic Tuesday afternoon.  Thank you

## 2021-08-23 NOTE — Telephone Encounter (Signed)
Spoke with the pt per Dr. Burt Knack and made his appt for 08/27/21 at 3:20 om with Dr. Burt Knack. Pt to call if he needs [place] sooner.

## 2021-08-27 ENCOUNTER — Encounter: Payer: Self-pay | Admitting: Cardiovascular Disease

## 2021-08-27 ENCOUNTER — Other Ambulatory Visit: Payer: Self-pay

## 2021-08-27 ENCOUNTER — Other Ambulatory Visit: Payer: Self-pay | Admitting: Cardiovascular Disease

## 2021-08-27 ENCOUNTER — Ambulatory Visit (INDEPENDENT_AMBULATORY_CARE_PROVIDER_SITE_OTHER): Payer: Medicare HMO

## 2021-08-27 ENCOUNTER — Ambulatory Visit: Payer: Medicare HMO | Admitting: Cardiovascular Disease

## 2021-08-27 ENCOUNTER — Telehealth: Payer: Self-pay | Admitting: *Deleted

## 2021-08-27 VITALS — BP 132/70 | HR 74 | Ht 71.0 in | Wt 249.0 lb

## 2021-08-27 DIAGNOSIS — I2510 Atherosclerotic heart disease of native coronary artery without angina pectoris: Secondary | ICD-10-CM

## 2021-08-27 DIAGNOSIS — I498 Other specified cardiac arrhythmias: Secondary | ICD-10-CM

## 2021-08-27 DIAGNOSIS — E782 Mixed hyperlipidemia: Secondary | ICD-10-CM

## 2021-08-27 DIAGNOSIS — I10 Essential (primary) hypertension: Secondary | ICD-10-CM | POA: Diagnosis not present

## 2021-08-27 DIAGNOSIS — I493 Ventricular premature depolarization: Secondary | ICD-10-CM

## 2021-08-27 DIAGNOSIS — R0602 Shortness of breath: Secondary | ICD-10-CM
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
## 2021-08-27 NOTE — Progress Notes (Unsigned)
Patient enrolled for [employer] to mail a 3 day ZIO XT monitor to his address on file.

## 2021-08-27 NOTE — Patient Instructions (Addendum)
Medication Instructions:   Your physician recommends that you continue on your current medications as directed. Please refer to the Current Medication list given to you today.  *If you need a refill on your cardiac medications before your next appointment, please call your pharmacy*   Lab Work:  TODAY--CMET, CBC, TSH, AND MAGNESIUM LEVEL  If you have labs (blood work) drawn today and your tests are completely normal, you will receive your results only by: [place] (if you have MyChart) OR A paper copy in the mail If you have any lab test that is abnormal or we need to change your treatment, we will call you to review the results.   Testing/Procedures:  Your physician has requested that you have an echocardiogram. Echocardiography is a painless test that uses sound waves to create images of your heart. It provides your doctor with information about the size and shape of your heart and how well your heart's chambers and valves are working. This procedure takes approximately one hour. There are no restrictions for this procedure.   ZIO XT- Long Term Monitor Instructions  Your physician has requested you wear a ZIO patch monitor for 3 days.  This is a single patch monitor. [employer] supplies one patch monitor per enrollment. Additional stickers are not available. Please do not apply patch if you will be having a Nuclear Stress Test,  Echocardiogram, Cardiac CT, MRI, or Chest Xray during the period you would be wearing the  monitor. The patch cannot be worn during these tests. You cannot remove and re-apply the  ZIO XT patch monitor.  Your ZIO patch monitor will be mailed 3 day USPS to your address on file. It may take 3-5 days  to receive your monitor after you have been enrolled.  Once you have received your monitor, please review the enclosed instructions. Your monitor  has already been registered assigning a specific monitor serial # to you.  Billing and Patient Assistance  Program Information  We have supplied [employer] with any of your insurance information on file for billing purposes. [employer] offers a sliding scale Patient Assistance Program for patients that do not have  insurance, or whose insurance does not completely cover the cost of the ZIO monitor.  You must apply for the Patient Assistance Program to qualify for this discounted rate.  To apply, please call [employer] at [phone], select option 4, select option 2, ask to apply for  Patient Assistance Program. Theodore Demark will ask your household income, and how many people  are in your household. They will quote your out-of-pocket cost based on that information.  [employer] will also be able to set up a 3-month, interest-free payment plan if needed.  Applying the monitor   Shave hair from upper left chest.  Hold abrader disc by orange tab. Rub abrader in 40 strokes over the upper left chest as  indicated in your monitor instructions.  Clean area with 4 enclosed alcohol pads. Let dry.  Apply patch as indicated in monitor instructions. Patch will be placed under collarbone on left  side of chest with arrow pointing upward.  Rub patch adhesive wings for 2 minutes. Remove white label marked "1". Remove the white  label marked "2". Rub patch adhesive wings for 2 additional minutes.  While looking in a mirror, press and release button in center of patch. A small green light will  flash 3-4 times. This will be your only indicator that the monitor has been turned on.  Do not shower for  the first 24 hours. You may shower after the first 24 hours.  Press the button if you feel a symptom. You will hear a small click. Record Date, Time and  Symptom in the Patient Logbook.  When you are ready to remove the patch, follow instructions on the last 2 pages of Patient  Logbook. Stick patch monitor onto the last page of Patient Logbook.  Place Patient Logbook in the blue and white box. Use locking tab on box and tape box  closed  securely. The blue and white box has prepaid postage on it. Please place it in the mailbox as  soon as possible. Your physician should have your test results approximately 7 days after the  monitor has been mailed back to [facility].  Call [place] at [phone] if you have questions regarding  your ZIO XT patch monitor. Call them immediately if you see an orange light blinking on your  monitor.  If your monitor falls off in less than 4 days, contact our Monitor department at [phone].  If your monitor becomes loose or falls off after 4 days call [employer] at [phone] for  suggestions on securing your monitor    Follow-Up:   IN 6 MONTHS IN THE OFFICE WITH DR. Verita Lamb, PLEASE ADD THE PATIENT TO DR. Antionette Char SCHEDULE FOR 02/25/22 AT 9:40 AM PER DR. Burt Knack

## 2021-08-27 NOTE — Telephone Encounter (Signed)
-----   Message from Jennefer Bravo sent at 08/27/2021  4:23 PM EDT ----- Regarding: RE: 3 DAY ZIO PER COOPER Done  ----- Message ----- From: Nuala Alpha, LPN Sent: 1/95/0932   3:47 PM EDT To: Jennefer Bravo, Katrina Theda [place] Subject: 3 DAY ZIO PER COOPER                           This pt needs to have a 3 day zio for bigeminy per Dr. Burt Knack. Can you please enroll and let me know when you do?  Thanks, [employer]

## 2021-08-28 LAB — TSH: TSH: 2.4 u[IU]/mL (ref 0.450–4.500)

## 2021-08-28 LAB — MAGNESIUM: Magnesium: 2 mg/dL (ref 1.6–2.3)

## 2021-08-28 LAB — CBC
Hematocrit: 47.1 % (ref 37.5–51.0)
Hemoglobin: 15.7 g/dL (ref 13.0–17.7)
MCH: 29.8 pg (ref 26.6–33.0)
MCHC: 33.3 g/dL (ref 31.5–35.7)
MCV: 89 fL (ref 79–97)
Platelets: 216 10*3/uL (ref 150–450)
RBC: 5.27 x10E6/uL (ref 4.14–5.80)
RDW: 13.2 % (ref 11.6–15.4)
WBC: 6.8 10*3/uL (ref 3.4–10.8)

## 2021-08-28 LAB — COMPREHENSIVE METABOLIC PANEL
ALT: 24 IU/L (ref 0–44)
AST: 20 IU/L (ref 0–40)
Albumin/Globulin Ratio: 1.9 (ref 1.2–2.2)
Albumin: 4.6 g/dL (ref 3.7–4.7)
Alkaline Phosphatase: 70 IU/L (ref 44–121)
BUN/Creatinine Ratio: 19 (ref 10–24)
BUN: 25 mg/dL (ref 8–27)
Bilirubin Total: 0.5 mg/dL (ref 0.0–1.2)
CO2: 21 mmol/L (ref 20–29)
Calcium: 10.1 mg/dL (ref 8.6–10.2)
Chloride: 100 mmol/L (ref 96–106)
Creatinine, Ser: 1.33 mg/dL — ABNORMAL HIGH (ref 0.76–1.27)
Globulin, Total: 2.4 g/dL (ref 1.5–4.5)
Glucose: 165 mg/dL — ABNORMAL HIGH (ref 70–99)
Potassium: 4.7 mmol/L (ref 3.5–5.2)
Sodium: 136 mmol/L (ref 134–144)
Total Protein: 7 g/dL (ref 6.0–8.5)
eGFR: 55 mL/min/{1.73_m2} — ABNORMAL LOW (ref >59)

## 2021-08-29 ENCOUNTER — Telehealth: Payer: Self-pay | Admitting: Cardiovascular Disease

## 2021-08-29 NOTE — Telephone Encounter (Signed)
Follow up:     Patient returning a call back from yesterday for results.

## 2021-08-29 NOTE — Telephone Encounter (Signed)
Results and plan of care to continue current medical treatment reviewed with patient who verbalized understanding. He thanked me for the call.

## 2021-09-01 DIAGNOSIS — I493 Ventricular premature depolarization: Secondary | ICD-10-CM

## 2021-09-01 DIAGNOSIS — I498 Other specified cardiac arrhythmias: Secondary | ICD-10-CM | POA: Diagnosis not present

## 2021-09-11 ENCOUNTER — Other Ambulatory Visit: Payer: Self-pay

## 2021-09-11 ENCOUNTER — Ambulatory Visit (HOSPITAL_COMMUNITY): Payer: Medicare HMO | Attending: Cardiology

## 2021-09-11 DIAGNOSIS — R0602 Shortness of breath: Secondary | ICD-10-CM | POA: Diagnosis not present

## 2021-09-11 LAB — ECHOCARDIOGRAM COMPLETE
Area-P 1/2: 3.02 cm2
S' Lateral: 3.9 cm

## 2021-09-16 ENCOUNTER — Telehealth: Payer: Self-pay | Admitting: *Deleted

## 2021-09-16 DIAGNOSIS — I493 Ventricular premature depolarization: Secondary | ICD-10-CM

## 2021-09-16 MED ORDER — METOPROLOL SUCCINATE ER 50 MG PO TB24
50.0000 mg | ORAL_TABLET | Freq: Every day | ORAL | 11 refills | Status: DC
Start: 2021-09-16 — End: 2022-09-29

## 2021-09-16 NOTE — Telephone Encounter (Signed)
-----   Message from Sherren Mocha, MD sent at 09/15/2021  6:29 PM EDT ----- See result note from monitor. Please refer to Dr Quentin Ore for high PVC burden/frequent couplets. Probably scar-mediated.   thx

## 2021-09-16 NOTE — Telephone Encounter (Signed)
-----   Message from Sherren Mocha, MD sent at 09/15/2021  6:28 PM EDT ----- Increase metoprolol succinate to 50 mg daily Refer to EP for high PVC burden Results discussed with patient over the phone Thanks

## 2021-09-16 NOTE — Telephone Encounter (Signed)
Already addressed earlier today.  See other phone encounter from today's date.

## 2021-09-16 NOTE — Telephone Encounter (Signed)
-----   Message from Sherren Mocha, MD sent at 09/15/2021  6:29 PM EDT ----- LVEF down slightly from baseline in 2019 LVEF was 45-50% now 40-45%. Increase Toprol to 50 mg daily for high PVC burden. Plan to change losartan to entresto but will make sure he tolerates beta blocker increase first.

## 2021-09-16 NOTE — Telephone Encounter (Signed)
The patient has been notified of the result and verbalized understanding.  All questions (if any) were answered. Darrell Jewel, RN 09/16/2021 10:46 AM    Sent in increased dose of Toprol XL 50 mg.

## 2021-09-25 ENCOUNTER — Ambulatory Visit: Payer: Medicare HMO | Admitting: Cardiology

## 2021-09-25 ENCOUNTER — Other Ambulatory Visit: Payer: Self-pay

## 2021-09-25 ENCOUNTER — Encounter: Payer: Self-pay | Admitting: Cardiology

## 2021-09-25 VITALS — BP 130/78 | HR 74 | Ht 71.0 in | Wt 241.0 lb

## 2021-09-25 DIAGNOSIS — I2510 Atherosclerotic heart disease of native coronary artery without angina pectoris: Secondary | ICD-10-CM

## 2021-09-25 DIAGNOSIS — I493 Ventricular premature depolarization: Secondary | ICD-10-CM | POA: Diagnosis not present

## 2021-09-25 DIAGNOSIS — I10 Essential (primary) hypertension: Secondary | ICD-10-CM

## 2021-09-25 MED ORDER — AMIODARONE HCL 200 MG PO TABS
ORAL_TABLET | ORAL | 2 refills | Status: DC
Start: 2021-09-25 — End: 2022-08-20
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
## 2021-09-25 NOTE — Patient Instructions (Signed)
Medication Instructions:  Your physician has recommended you make the following change in your medication:  START Amiodarone  - take 2 tablets (400 mg total) TWICE a day for 5 days, then  - take 2 tablets (400 mg total) ONCE a day for 5 days, then  - take 1 tablet (200 mg total) ONCE a day   *If you need a refill on your cardiac medications before your next appointment, please call your pharmacy*   Lab Work: None ordered   Testing/Procedures: None ordered   Follow-Up: At [facility], you and your health needs are our priority.  As part of our continuing mission to provide you with exceptional heart care, we have created designated Provider Care Teams.  These Care Teams include your primary Cardiologist (physician) and Advanced Practice Providers (APPs -  Physician Assistants and Nurse Practitioners) who all work together to provide you with the care you need, when you need it.  We recommend signing up for the patient portal called "MyChart".  Sign up information is provided on this After Visit Summary.  MyChart is used to connect with patients for Virtual Visits (Telemedicine).  Patients are able to view lab/test results, encounter notes, upcoming appointments, etc.  Non-urgent messages can be sent to your provider as well.   To learn more about what you can do with MyChart, go to [email or web address].    Your next appointment:   3 month(s)  The format for your next appointment:   In Person  Provider:   Lars Mage, MD    Thank you for choosing [facility]!!   Trinidad Curet, RN [phone]   Other Instructions  Amiodarone Tablets What is this medication? AMIODARONE (a MEE oh da rone) prevents and treats a fast or irregular heartbeat (arrhythmia). It works by slowing down overactive electric signals in the heart, which stabilizes your heart rhythm. It belongs to a group of medications called antiarrhythmics. This medicine may be used for other purposes;  ask your health care provider or pharmacist if you have questions. COMMON BRAND NAME(S): Cordarone, Pacerone What should I tell my care team before I take this medication? They need to know if you have any of these conditions: Liver disease Lung disease Other heart problems Thyroid disease An unusual or allergic reaction to amiodarone, iodine, other medications, foods, dyes, or preservatives Pregnant or trying to get pregnant Breast-feeding How should I use this medication? Take this medication by mouth with a glass of water. Follow the directions on the prescription label. You can take this medication with or without food. However, you should always take it the same way each time. Take your doses at regular intervals. Do not take your medication more often than directed. Do not stop taking except on the advice of your care team. A special MedGuide will be given to you by the pharmacist with each prescription and refill. Be sure to read this information carefully each time. Talk to your care team regarding the use of this medication in children. Special care may be needed. Overdosage: If you think you have taken too much of this medicine contact a poison control center or emergency room at once. NOTE: This medicine is only for you. Do not share this medicine with others. What if I miss a dose? If you miss a dose, take it as soon as you can. If it is almost time for your next dose, take only that dose. Do not take double or extra doses. What may interact with this medication?  Do not take this medication with any of the following: Abarelix Apomorphine Arsenic trioxide Certain antibiotics like erythromycin, gemifloxacin, levofloxacin, pentamidine Certain medications for depression like amoxapine, tricyclic antidepressants Certain medications for fungal infections like fluconazole, itraconazole, ketoconazole, posaconazole, voriconazole Certain medications for irregular heartbeat like  disopyramide, dronedarone, ibutilide, propafenone, sotalol Certain medications for malaria like chloroquine, halofantrine Cisapride Droperidol Haloperidol [place] Maprotiline Methadone Phenothiazines like chlorpromazine, mesoridazine, thioridazine Pimozide Ranolazine Red yeast rice Vardenafil This medication may also interact with the following: Antiviral medications for HIV or AIDS Certain medications for blood pressure, heart disease, irregular heart beat Certain medications for cholesterol like atorvastatin, cerivastatin, lovastatin, simvastatin Certain medications for hepatitis C like sofosbuvir and ledipasvir; sofosbuvir Certain medications for seizures like phenytoin Certain medications for thyroid problems Certain medications that treat or prevent blood clots like warfarin Cholestyramine Cimetidine Clopidogrel Cyclosporine Dextromethorphan Diuretics Dofetilide Fentanyl General anesthetics Grapefruit juice Lidocaine Loratadine Methotrexate Other medications that prolong the QT interval (cause an abnormal heart rhythm) Procainamide Quinidine Rifabutin, rifampin, or rifapentine St. John's Wort Trazodone Ziprasidone This list may not describe all possible interactions. Give your health care provider a list of all the medicines, herbs, non-prescription drugs, or dietary supplements you use. Also tell them if you smoke, drink alcohol, or use illegal drugs. Some items may interact with your medicine. What should I watch for while using this medication? Your condition will be monitored closely when you first begin therapy. Often, this medication is first started in a hospital or other monitored health care setting. Once you are on maintenance therapy, visit your care team for regular checks on your progress. Because your condition and use of this medication carry some risk, it is a good idea to carry an identification card, necklace or bracelet with details of your  condition, medications, and care team. You may get drowsy or dizzy. Do not drive, use machinery, or do anything that needs mental alertness until you know how this medication affects you. Do not stand or sit up quickly, especially if you are an older patient. This reduces the risk of dizzy or fainting spells. This medication can make you more sensitive to the sun. Keep out of the sun. If you cannot avoid being in the sun, wear protective clothing and use sunscreen. Do not use sun lamps or tanning beds/booths. You should have regular eye exams before and during treatment. Call your care team if you have blurred vision, see halos, or your eyes become sensitive to light. Your eyes may get dry. It may be helpful to use a lubricating eye solution or artificial tears solution. If you are going to have surgery or a procedure that requires contrast dyes, tell your care team that you are taking this medication. What side effects may I notice from receiving this medication? Side effects that you should report to your care team as soon as possible: Allergic reactions-skin rash, itching, hives, swelling of the face, lips, tongue, or throat Bluish-gray skin Change in vision such as blurry vision, seeing halos around lights, vision loss Heart failure-shortness of breath, swelling of the ankles, feet, or hands, sudden weight gain, unusual weakness or fatigue Heart rhythm changes-fast or irregular heartbeat, dizziness, feeling faint or lightheaded, chest pain, trouble breathing High thyroid levels (hyperthyroidism)-fast or irregular heartbeat, weight loss, excessive sweating or sensitivity to heat, tremors or shaking, anxiety, nervousness, irregular menstrual cycle or spotting Liver injury-right upper belly pain, loss of appetite, nausea, light-colored stool, dark yellow or brown urine, yellowing skin or eyes, unusual weakness or fatigue  Low thyroid levels (hypothyroidism)-unusual weakness or fatigue, sensitivity to  cold, constipation, hair loss, dry skin, weight gain, feelings of depression Lung injury-shortness of breath or trouble breathing, cough, spitting up blood, chest pain, fever Pain, tingling, or numbness in the hands or feet, muscle weakness, trouble walking, loss of balance or coordination Side effects that usually do not require medical attention (report to your care team if they continue or are bothersome): Nausea Vomiting This list may not describe all possible side effects. Call your doctor for medical advice about side effects. You may report side effects to FDA at 1-800-FDA-1088. Where should I keep my medication? Keep out of the reach of children and pets. Store at room temperature between 20 and 25 degrees C (68 and 77 degrees F). Protect from light. Keep container tightly closed. Throw away any unused medication after the expiration date. NOTE: This sheet is a summary. It may not cover all possible information. If you have questions about this medicine, talk to your doctor, pharmacist, or health care provider.  2022 Elsevier/Gold Standard (2020-12-20 11:04:04)

## 2021-12-25 ENCOUNTER — Ambulatory Visit: Payer: Medicare HMO | Admitting: Cardiology

## 2022-02-18 ENCOUNTER — Other Ambulatory Visit: Payer: Self-pay

## 2022-02-18 DIAGNOSIS — Z006 Encounter for examination for normal comparison and control in clinical research program: Secondary | ICD-10-CM

## 2022-02-18 NOTE — Research (Addendum)
Patient seen in clinic for ORION 4 trial Month 39 visit. Concomitant medications reviewed and updated in [facility]. He denies any adverse events since his last clinic visit. Labs were drawn per protocol for this visit @ 0900. IP was then given @ 0906 in the [facility]. Patient tolerated both procedures without complications. Next appointment is Sept 19 @ 0830 for Month 45. No labs due at that time. ? ? ?Current Outpatient Medications:  ?  amiodarone (PACERONE) 200 MG tablet, Take 2 tablets (400 mg total) TWICE daily for 5 days ,then take 2 tablets (400 mg total) ONCE daily for 5 days, then take 1 tablet (200 mg total) ONCE daily, Disp: 60 tablet, Rfl: 2 ?  cetirizine-pseudoephedrine (ZYRTEC-D) 5-120 MG tablet, Take 1 tablet by mouth 2 (two) times daily., Disp: , Rfl:  ?  empagliflozin (JARDIANCE) 25 MG TABS tablet, Take 25 mg by mouth daily., Disp: , Rfl:  ?  glucose blood (CHOICE DM FORA G20 TEST STRIPS) test strip, Use as instructed, Disp: 100 each, Rfl: 12 ?  glucose monitoring kit (FREESTYLE) monitoring kit, 1 each by Does not apply route as needed for other., Disp: 1 each, Rfl: 0 ?  Lancets (ONETOUCH ULTRASOFT) lancets, Use as instructed, Disp: 100 each, Rfl: 12 ?  latanoprost (XALATAN) 0.005 % ophthalmic solution, Place 1 drop into both eyes at bedtime., Disp: , Rfl:  ?  losartan (COZAAR) 25 MG tablet, Take 1 tablet (25 mg total) by mouth daily., Disp: 90 tablet, Rfl: 3 ?  metFORMIN (GLUCOPHAGE) 500 MG tablet, Take 500 mg by mouth 2 (two) times daily with a meal. , Disp: , Rfl:  ?  metoprolol succinate (TOPROL-XL) 50 MG 24 hr tablet, Take 1 tablet (50 mg total) by mouth daily. Take with or immediately following a meal., Disp: 30 tablet, Rfl: 11 ?  montelukast (SINGULAIR) 10 MG tablet, Take 1 tablet (10 mg total) by mouth at bedtime., Disp: 90 tablet, Rfl: 3 ?  Multiple Vitamins-Minerals (MULTIVITAMIN WITH MINERALS) tablet, Take 1 tablet by mouth daily., Disp: , Rfl:  ?  oxyCODONE-acetaminophen (PERCOCET/ROXICET) 5-325 MG  tablet, Take 1-2 tablets by mouth every 6 (six) hours as needed for severe pain. (Patient not taking: Reported on 09/25/2021), Disp: 15 tablet, Rfl: 0 ?  pantoprazole (PROTONIX) 40 MG tablet, Take 1 tablet (40 mg total) by mouth daily. Take 30-60 min before first meal of the day (Patient taking differently: Take 40 mg by mouth daily. Taking at bedtime), Disp: 90 tablet, Rfl: 3 ?  pioglitazone (ACTOS) 30 MG tablet, Take 30 mg by mouth daily., Disp: , Rfl:  ?  polyethylene glycol (MIRALAX / GLYCOLAX) 17 g packet, Take 17 g by mouth daily as needed for mild constipation., Disp: 14 each, Rfl: 0 ?  Study - ORION 4 - inclisiran 300 mg/1.8m or placebo SQ injection (PI-Stuckey), Inject 300 mg into the skin every 6 (six) months., Disp: , Rfl:  ?  Travoprost, BAK Free, (TRAVATAN) 0.004 % SOLN ophthalmic solution, travoprost 0.004 % eye drops  INSTILL ONE DROP IN [facility] AT BEDTIME, Disp: , Rfl:  ?  triamcinolone cream (KENALOG) 0.1 %, Apply 1 application topically 2 (two) times daily as needed (for irritation). , Disp: , Rfl: 0  ?

## 2022-02-25 ENCOUNTER — Ambulatory Visit: Payer: Medicare HMO | Admitting: Cardiovascular Disease
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
## 2022-02-26 ENCOUNTER — Encounter: Payer: Self-pay | Admitting: Cardiovascular Disease

## 2022-02-26 ENCOUNTER — Ambulatory Visit: Payer: Medicare HMO | Admitting: Cardiovascular Disease

## 2022-02-26 VITALS — BP 110/70 | HR 73 | Ht 71.0 in | Wt 252.8 lb

## 2022-02-26 DIAGNOSIS — I255 Ischemic cardiomyopathy: Secondary | ICD-10-CM

## 2022-02-26 DIAGNOSIS — I498 Other specified cardiac arrhythmias: Secondary | ICD-10-CM

## 2022-02-26 DIAGNOSIS — I2510 Atherosclerotic heart disease of native coronary artery without angina pectoris: Secondary | ICD-10-CM

## 2022-02-26 DIAGNOSIS — E782 Mixed hyperlipidemia: Secondary | ICD-10-CM | POA: Diagnosis not present

## 2022-02-26 DIAGNOSIS — I10 Essential (primary) hypertension: Secondary | ICD-10-CM | POA: Diagnosis not present
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
## 2022-02-26 NOTE — Patient Instructions (Signed)
Medication Instructions:  ?Your physician recommends that you continue on your current medications as directed. Please refer to the Current Medication list given to you today. ? ?*If you need a refill on your cardiac medications before your next appointment, please call your pharmacy* ? ? ?Lab Work: ?NONE ?If you have labs (blood work) drawn today and your tests are completely normal, you will receive your results only by: ?MyChart Message (if you have MyChart) OR ?A paper copy in the mail ?If you have any lab test that is abnormal or we need to change your treatment, we will call you to review the results. ? ? ?Testing/Procedures: ?NONE ? ? ?Follow-Up: ?At [facility], you and your health needs are our priority.  As part of our continuing mission to provide you with exceptional heart care, we have created designated Provider Care Teams.  These Care Teams include your primary Cardiologist (physician) and Advanced Practice Providers (APPs -  Physician Assistants and Nurse Practitioners) who all work together to provide you with the care you need, when you need it. ? ?We recommend signing up for the patient portal called "MyChart".  Sign up information is provided on this After Visit Summary.  MyChart is used to connect with patients for Virtual Visits (Telemedicine).  Patients are able to view lab/test results, encounter notes, upcoming appointments, etc.  Non-urgent messages can be sent to your provider as well.   ?To learn more about what you can do with MyChart, go to [email or web address].   ? ?Your next appointment:   ?6 month(s) ? ?The format for your next appointment:   ?In Person ? ?Provider:   ?Sherren Mocha, MD   ? ? ?  ?

## 2022-02-27 DIAGNOSIS — Z006 Encounter for examination for normal comparison and control in clinical research program: Secondary | ICD-10-CM

## 2022-02-27 MED ORDER — STUDY - ORION 4 - INCLISIRAN 300 MG/1.5 ML OR PLACEBO SQ INJECTION (PI-STUCKEY): 300.0000 mg | INJECTION | SUBCUTANEOUS | 1 refills | Status: AC

## 2022-08-08 ENCOUNTER — Ambulatory Visit: Payer: Medicare HMO | Admitting: Cardiovascular Disease

## 2022-08-20 ENCOUNTER — Ambulatory Visit: Payer: Medicare HMO | Attending: Cardiovascular Disease | Admitting: Cardiovascular Disease

## 2022-08-20 ENCOUNTER — Encounter: Payer: Self-pay | Admitting: Cardiovascular Disease

## 2022-08-20 VITALS — BP 124/84 | HR 65 | Ht 71.0 in | Wt 240.4 lb

## 2022-08-20 DIAGNOSIS — I2510 Atherosclerotic heart disease of native coronary artery without angina pectoris: Secondary | ICD-10-CM | POA: Diagnosis not present

## 2022-08-20 DIAGNOSIS — E782 Mixed hyperlipidemia: Secondary | ICD-10-CM

## 2022-08-20 DIAGNOSIS — I498 Other specified cardiac arrhythmias: Secondary | ICD-10-CM

## 2022-08-20 DIAGNOSIS — I10 Essential (primary) hypertension: Secondary | ICD-10-CM

## 2022-08-20 DIAGNOSIS — I255 Ischemic cardiomyopathy: Secondary | ICD-10-CM

## 2022-08-20 MED ORDER — ENTRESTO 24-26 MG PO TABS: 1.0000 | ORAL_TABLET | Freq: Two times a day (BID) | ORAL | 0 refills | Status: DC

## 2022-08-20 NOTE — Patient Instructions (Signed)
Medication Instructions:  STOP Losartan START Entresto 24/'26mg'$  twice daily *If you need a refill on your cardiac medications before your next appointment, please call your pharmacy*   Lab Work: BMET in 2 weeks If you have labs (blood work) drawn today and your tests are completely normal, you will receive your results only by: [place] (if you have MyChart) OR A paper copy in the mail If you have any lab test that is abnormal or we need to change your treatment, we will call you to review the results.   Testing/Procedures: ECHO (in 6 months) Your physician has requested that you have an echocardiogram. Echocardiography is a painless test that uses sound waves to create images of your heart. It provides your doctor with information about the size and shape of your heart and how well your heart's chambers and valves are working. This procedure takes approximately one hour. There are no restrictions for this procedure.  Follow-Up: At [facility], you and your health needs are our priority.  As part of our continuing mission to provide you with exceptional heart care, we have created designated Provider Care Teams.  These Care Teams include your primary Cardiologist (physician) and Advanced Practice Providers (APPs -  Physician Assistants and Nurse Practitioners) who all work together to provide you with the care you need, when you need it.  We recommend signing up for the patient portal called "MyChart".  Sign up information is provided on this After Visit Summary.  MyChart is used to connect with patients for Virtual Visits (Telemedicine).  Patients are able to view lab/test results, encounter notes, upcoming appointments, etc.  Non-urgent messages can be sent to your provider as well.   To learn more about what you can do with MyChart, go to [email or web address].    Your next appointment:   6 month(s)  The format for your next appointment:   In Person  Provider:    Sherren Mocha, MD       Important Information About Sugar
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
## 2022-09-05 ENCOUNTER — Other Ambulatory Visit: Payer: Medicare HMO

## 2022-09-12 ENCOUNTER — Ambulatory Visit: Payer: Medicare HMO | Attending: Cardiovascular Disease

## 2022-09-12 DIAGNOSIS — I2510 Atherosclerotic heart disease of native coronary artery without angina pectoris: Secondary | ICD-10-CM

## 2022-09-12 DIAGNOSIS — I255 Ischemic cardiomyopathy: Secondary | ICD-10-CM

## 2022-09-12 DIAGNOSIS — E782 Mixed hyperlipidemia: Secondary | ICD-10-CM

## 2022-09-12 DIAGNOSIS — I10 Essential (primary) hypertension: Secondary | ICD-10-CM

## 2022-09-12 DIAGNOSIS — I498 Other specified cardiac arrhythmias: Secondary | ICD-10-CM

## 2022-09-12 LAB — BASIC METABOLIC PANEL
BUN/Creatinine Ratio: 18 (ref 10–24)
BUN: 21 mg/dL (ref 8–27)
CO2: 21 mmol/L (ref 20–29)
Calcium: 9.3 mg/dL (ref 8.6–10.2)
Chloride: 99 mmol/L (ref 96–106)
Creatinine, Ser: 1.19 mg/dL (ref 0.76–1.27)
Glucose: 141 mg/dL — ABNORMAL HIGH (ref 70–99)
Potassium: 4.6 mmol/L (ref 3.5–5.2)
Sodium: 135 mmol/L (ref 134–144)
eGFR: 62 mL/min/{1.73_m2} (ref >59)

## 2022-09-26 ENCOUNTER — Other Ambulatory Visit: Payer: Self-pay | Admitting: Cardiovascular Disease

## 2022-09-29 ENCOUNTER — Other Ambulatory Visit: Payer: Self-pay | Admitting: Cardiovascular Disease

## 2022-09-29 MED ORDER — ENTRESTO 24-26 MG PO TABS: 1.0000 | ORAL_TABLET | Freq: Two times a day (BID) | ORAL | 3 refills | Status: DC

## 2022-10-27 ENCOUNTER — Ambulatory Visit
Admission: RE | Admit: 2022-10-27 | Discharge: 2022-10-27 | Disposition: A | Discharge status: Home / Self Care | Payer: Medicare HMO | Source: Ambulatory Visit | Attending: Internal Medicine | Admitting: Internal Medicine

## 2022-10-27 ENCOUNTER — Other Ambulatory Visit: Payer: Self-pay | Admitting: Internal Medicine

## 2022-10-27 DIAGNOSIS — R059 Cough, unspecified: Secondary | ICD-10-CM

## 2022-11-03 ENCOUNTER — Telehealth: Payer: Self-pay | Admitting: *Deleted

## 2022-11-03 NOTE — Telephone Encounter (Signed)
Called patient to remind patient of his appt

## 2022-11-04 ENCOUNTER — Encounter: Payer: Medicare HMO | Admitting: *Deleted

## 2022-11-04 VITALS — BP 143/85 | HR 86 | Temp 98.2°F | Resp 18

## 2022-11-04 DIAGNOSIS — Z006 Encounter for examination for normal comparison and control in clinical research program: Secondary | ICD-10-CM

## 2022-11-04 MED ORDER — STUDY - ORION 4 - INCLISIRAN 300 MG/1.5 ML OR PLACEBO SQ INJECTION (PI-STUCKEY)
300.0000 mg | INJECTION | SUBCUTANEOUS | Status: DC
  Administered 2022-11-04: 300 mg via SUBCUTANEOUS
  Filled 2022-11-04: qty 1.5

## 2022-11-04 NOTE — Research (Signed)
[name] here for [name] 4 study. He reports no A/E's. VS taken at 1200. No labs due today. Injection given in left lower abd at 1235 tol well. Next appointment scheduled for 0900 on June 4.

## 2023-01-06 ENCOUNTER — Telehealth: Payer: Self-pay | Admitting: Cardiovascular Disease

## 2023-01-06 MED ORDER — ENTRESTO 24-26 MG PO TABS: 1.0000 | ORAL_TABLET | Freq: Two times a day (BID) | ORAL | 3 refills | Status: DC

## 2023-01-06 NOTE — Telephone Encounter (Signed)
Pt sent email to [facility]:  Dr Burt Knack , would you write me a prescription for Entresto 24 mg-'26mg'$  as you did before. My insurance requires this to be sent from mail order for a 90 day supply. The telephone number is [phone]. And their fax number is [phone]. As always you're my Hero. Jola Baptist Sent from my iPhone  Per Dr Burt Knack, okay to send in for patient as requested. Rx send to mail order on file at this time.

## 2023-01-30 ENCOUNTER — Ambulatory Visit: Payer: Medicare Other | Attending: Cardiovascular Disease | Admitting: Cardiovascular Disease

## 2023-01-30 ENCOUNTER — Encounter: Payer: Self-pay | Admitting: Cardiovascular Disease

## 2023-01-30 VITALS — BP 120/90 | HR 90 | Ht 71.0 in | Wt 248.2 lb

## 2023-01-30 DIAGNOSIS — I493 Ventricular premature depolarization: Secondary | ICD-10-CM

## 2023-01-30 DIAGNOSIS — E782 Mixed hyperlipidemia: Secondary | ICD-10-CM

## 2023-01-30 DIAGNOSIS — Z01810 Encounter for preprocedural cardiovascular examination: Secondary | ICD-10-CM

## 2023-01-30 DIAGNOSIS — I10 Essential (primary) hypertension: Secondary | ICD-10-CM | POA: Diagnosis not present

## 2023-01-30 DIAGNOSIS — I5020 Unspecified systolic (congestive) heart failure: Secondary | ICD-10-CM

## 2023-01-30 DIAGNOSIS — I25119 Atherosclerotic heart disease of native coronary artery with unspecified angina pectoris: Secondary | ICD-10-CM
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
## 2023-01-30 NOTE — Patient Instructions (Signed)
Medication Instructions:  STOP Losartan *If you need a refill on your cardiac medications before your next appointment, please call your pharmacy*  Lab Work: CBC, BMET today If you have labs (blood work) drawn today and your tests are completely normal, you will receive your results only by: [place] (if you have MyChart) OR A paper copy in the mail If you have any lab test that is abnormal or we need to change your treatment, we will call you to review the results.  Testing/Procedures: R & L heart catheterization Your physician has requested that you have a cardiac catheterization. Cardiac catheterization is used to diagnose and/or treat various heart conditions. Doctors may recommend this procedure for a number of different reasons. The most common reason is to evaluate chest pain. Chest pain can be a symptom of coronary artery disease (CAD), and cardiac catheterization can show whether plaque is narrowing or blocking your heart's arteries. This procedure is also used to evaluate the valves, as well as measure the blood flow and oxygen levels in different parts of your heart. For further information please visit [email or web address]. Please follow instruction sheet, as given.  Follow-Up: At [facility], you and your health needs are our priority.  As part of our continuing mission to provide you with exceptional heart care, we have created designated Provider Care Teams.  These Care Teams include your primary Cardiologist (physician) and Advanced Practice Providers (APPs -  Physician Assistants and Nurse Practitioners) who all work together to provide you with the care you need, when you need it.  We recommend signing up for the patient portal called "MyChart".  Sign up information is provided on this After Visit Summary.  MyChart is used to connect with patients for Virtual Visits (Telemedicine).  Patients are able to view lab/test results, encounter notes, upcoming appointments,  etc.  Non-urgent messages can be sent to your provider as well.   To learn more about what you can do with MyChart, go to [email or web address].    Your next appointment:   6 month(s)  Provider:   Sherren Mocha, MD      Other Instructions       Cardiac/Peripheral Catheterization   You are scheduled for a Cardiac Catheterization on Wednesday, March 6 with Dr. Sherren Mocha.  1. Please arrive at the Main Entrance A at [facility]: [place], [place] [address] on March 6 at 6:30 AM (This time is two hours before your procedure to ensure your preparation). Free valet parking service is available. You will check in at ADMITTING. The support person will be asked to wait in the waiting room.  It is OK to have someone drop you off and come back when you are ready to be discharged.        Special note: Every effort is made to have your procedure done on time. Please understand that emergencies sometimes delay scheduled procedures.   . 2. Diet: Do not eat solid foods after midnight.  You may have clear liquids until 5 AM the day of the procedure.  3. Labs: Today  4. Medication instructions in preparation for your procedure:   Contrast Allergy: No  DO NOT TAKE Metformin the day of procedure and hold for two days afterwards (Last dose Tuesday, may resume Saturday)  DO NOT TAKE Jardiance or Pioglitazone the morning of procedure  On the morning of your procedure, take Aspirin 81 mg and any morning medicines NOT listed above.  You may use sips  of water.  5. Plan to go home the same day, you will only stay overnight if medically necessary. 6. You MUST have a responsible adult to drive you home. 7. An adult MUST be with you the first 24 hours after you arrive home. 8. Bring a current list of your medications, and the last time and date medication taken. 9. Bring ID and current insurance cards. 10.Please wear clothes that are easy to get on and off and wear slip-on  shoes.  Thank you for allowing [place] to care for you!   -- [facility]

## 2023-01-31 LAB — BASIC METABOLIC PANEL
BUN/Creatinine Ratio: 20 (ref 10–24)
BUN: 21 mg/dL (ref 8–27)
CO2: 22 mmol/L (ref 20–29)
Calcium: 9.2 mg/dL (ref 8.6–10.2)
Chloride: 100 mmol/L (ref 96–106)
Creatinine, Ser: 1.04 mg/dL (ref 0.76–1.27)
Glucose: 229 mg/dL — ABNORMAL HIGH (ref 70–99)
Potassium: 4.8 mmol/L (ref 3.5–5.2)
Sodium: 138 mmol/L (ref 134–144)
eGFR: 73 mL/min/{1.73_m2} (ref >59)

## 2023-01-31 LAB — CBC
Hematocrit: 42.9 % (ref 37.5–51.0)
Hemoglobin: 14.3 g/dL (ref 13.0–17.7)
MCH: 30.7 pg (ref 26.6–33.0)
MCHC: 33.3 g/dL (ref 31.5–35.7)
MCV: 92 fL (ref 79–97)
Platelets: 174 10*3/uL (ref 150–450)
RBC: 4.66 x10E6/uL (ref 4.14–5.80)
RDW: 13.6 % (ref 11.6–15.4)
WBC: 6.3 10*3/uL (ref 3.4–10.8)

## 2023-02-03 ENCOUNTER — Ambulatory Visit (HOSPITAL_COMMUNITY): Payer: Medicare Other | Attending: Cardiovascular Disease

## 2023-02-03 DIAGNOSIS — I498 Other specified cardiac arrhythmias: Secondary | ICD-10-CM | POA: Diagnosis present

## 2023-02-03 DIAGNOSIS — E782 Mixed hyperlipidemia: Secondary | ICD-10-CM | POA: Diagnosis present

## 2023-02-03 DIAGNOSIS — I10 Essential (primary) hypertension: Secondary | ICD-10-CM | POA: Diagnosis present

## 2023-02-03 DIAGNOSIS — I2510 Atherosclerotic heart disease of native coronary artery without angina pectoris: Secondary | ICD-10-CM | POA: Insufficient documentation

## 2023-02-03 DIAGNOSIS — I255 Ischemic cardiomyopathy: Secondary | ICD-10-CM | POA: Diagnosis present

## 2023-02-03 LAB — ECHOCARDIOGRAM COMPLETE
Area-P 1/2: 4.39 cm2
MV M vel: 5.57 m/s
MV Peak grad: 124.1 mmHg
S' Lateral: 4.1 cm

## 2023-02-04 ENCOUNTER — Ambulatory Visit (HOSPITAL_COMMUNITY): Admission: RE | Admit: 2023-02-04 | Payer: Medicare Other | Source: Home / Self Care | Admitting: Cardiovascular Disease

## 2023-02-04 ENCOUNTER — Encounter (HOSPITAL_COMMUNITY): Admission: RE | Payer: Self-pay | Source: Home / Self Care

## 2023-02-04 SURGERY — RIGHT/LEFT HEART CATH AND CORONARY ANGIOGRAPHY
Anesthesia: LOCAL

## 2023-02-16 ENCOUNTER — Ambulatory Visit: Payer: Medicare HMO | Admitting: Cardiovascular Disease

## 2023-04-30 MED ORDER — STUDY - ORION 4 - INCLISIRAN 300 MG/1.5 ML OR PLACEBO SQ INJECTION (PI-STUCKEY)
300.0000 mg | INJECTION | SUBCUTANEOUS | Status: DC
  Administered 2023-05-05: 300 mg via SUBCUTANEOUS
  Filled 2023-04-30: qty 1.5

## 2023-05-05 ENCOUNTER — Encounter: Payer: Medicare HMO | Admitting: *Deleted

## 2023-05-05 DIAGNOSIS — Z006 Encounter for examination for normal comparison and control in clinical research program: Secondary | ICD-10-CM

## 2023-05-05 NOTE — Research (Signed)
Orion 4   Patient here for [employer] visit Hadn't had as much get up and go as he thought he should  Told Dr Excell Seltzer and they are going to do a left heart cath to check  No chest pains or shortness of breath   Injection given right lower abdomen @ 0940  Follow up appt 12/03 @ 0900   Current Outpatient Medications:    cetirizine-pseudoephedrine (ZYRTEC-D) 5-120 MG tablet, Take 1 tablet by mouth 2 (two) times daily., Disp: , Rfl:    Cholecalciferol 25 MCG (1000 UT) capsule, Take by mouth., Disp: , Rfl:    cyanocobalamin (VITAMIN B12) 500 MCG tablet, Take by mouth., Disp: , Rfl:    empagliflozin (JARDIANCE) 25 MG TABS tablet, Take 25 mg by mouth daily., Disp: , Rfl:    glucose blood (CHOICE DM FORA G20 TEST STRIPS) test strip, Use as instructed, Disp: 100 each, Rfl: 12   glucose monitoring kit (FREESTYLE) monitoring kit, 1 each by Does not apply route as needed for other., Disp: 1 each, Rfl: 0   Lancets (ONETOUCH ULTRASOFT) lancets, Use as instructed, Disp: 100 each, Rfl: 12   latanoprost (XALATAN) 0.005 % ophthalmic solution, Place 1 drop into both eyes at bedtime., Disp: , Rfl:    metFORMIN (GLUCOPHAGE) 500 MG tablet, Take 500 mg by mouth 2 (two) times daily with a meal. , Disp: , Rfl:    metoprolol succinate (TOPROL-XL) 50 MG 24 hr tablet, Take 1 tablet (50 mg total) by mouth daily. Take with or immediately following a meal., Disp: 90 tablet, Rfl: 3   montelukast (SINGULAIR) 10 MG tablet, Take 1 tablet (10 mg total) by mouth at bedtime., Disp: 90 tablet, Rfl: 3   Multiple Vitamins-Minerals (MULTIVITAMIN WITH MINERALS) tablet, Take 1 tablet by mouth daily., Disp: , Rfl:    pantoprazole (PROTONIX) 40 MG tablet, Take 1 tablet (40 mg total) by mouth daily. Take 30-60 min before first meal of the day (Patient taking differently: Take 40 mg by mouth daily. Taking at bedtime), Disp: 90 tablet, Rfl: 3   pioglitazone (ACTOS) 30 MG tablet, Take 30 mg by mouth daily., Disp: , Rfl:     sacubitril-valsartan (ENTRESTO) 24-26 MG, Take 1 tablet by mouth 2 (two) times daily., Disp: 180 tablet, Rfl: 3   Study - ORION 4 - inclisiran 300 mg/1.32mL or placebo SQ injection (PI-Stuckey), Inject 1.5 mLs (300 mg total) into the skin every 6 (six) months., Disp: 1 mL, Rfl: 1   Travoprost, BAK Free, (TRAVATAN) 0.004 % SOLN ophthalmic solution, travoprost 0.004 % eye drops  INSTILL ONE DROP IN [facility] EYE AT BEDTIME, Disp: , Rfl:    triamcinolone cream (KENALOG) 0.1 %, Apply 1 application topically 2 (two) times daily as needed (for irritation). , Disp: , Rfl: 0  Current Facility-Administered Medications:    Study - ORION 4 - inclisiran 300 mg/1.56mL or placebo SQ injection (PI-Stuckey), 300 mg, Subcutaneous, Q6 months, Riley Kill, Arturo Morton, MD
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
## 2023-06-29 ENCOUNTER — Telehealth: Payer: Self-pay | Admitting: Family Medicine

## 2023-06-29 ENCOUNTER — Encounter: Payer: Self-pay | Admitting: Family Medicine

## 2023-06-29 ENCOUNTER — Ambulatory Visit: Payer: Medicare Other | Admitting: Family Medicine

## 2023-06-29 ENCOUNTER — Other Ambulatory Visit: Payer: Self-pay

## 2023-06-29 VITALS — BP 108/62 | HR 93 | Ht 71.0 in | Wt 273.0 lb

## 2023-06-29 DIAGNOSIS — M7062 Trochanteric bursitis, left hip: Secondary | ICD-10-CM | POA: Diagnosis not present

## 2023-06-29 DIAGNOSIS — M7061 Trochanteric bursitis, right hip: Secondary | ICD-10-CM

## 2023-06-29 DIAGNOSIS — M25552 Pain in left hip: Secondary | ICD-10-CM | POA: Diagnosis not present

## 2023-06-29 NOTE — Patient Instructions (Signed)
Injected both hips Ice  Exercises See me in 3 months Dr. Jearld Fenton with ENT

## 2023-06-29 NOTE — Telephone Encounter (Signed)
Left message for patient

## 2023-06-29 NOTE — Telephone Encounter (Signed)
Dr Katrinka Blazing told the patient about a surgery and implant that Dr Jearld Fenton does to help with sleeping and throat closing? He said that he contacted Dr Jearld Fenton office and the person he spoke to said that Dr Jearld Fenton does not do anything like that.  He asked if there was another way to contact Dr Jearld Fenton about it?  Please advise.

## 2023-06-29 NOTE — Assessment & Plan Note (Signed)
Bilateral injections given today, tolerated the procedure well, discussed icing regimen and home exercises.  Differential includes lumbar radiculopathy.  Can consider possibly increasing gabapentin if needed.  Patient has been using it at night intermittently.  Follow-up with me again in 6 to 8 weeks otherwise.

## 2023-08-18 ENCOUNTER — Ambulatory Visit: Payer: Medicare Other | Attending: Cardiovascular Disease | Admitting: Cardiovascular Disease

## 2023-08-18 ENCOUNTER — Encounter: Payer: Self-pay | Admitting: Cardiovascular Disease

## 2023-08-18 VITALS — BP 120/80 | HR 86 | Ht 71.0 in | Wt 244.8 lb

## 2023-08-18 DIAGNOSIS — I493 Ventricular premature depolarization: Secondary | ICD-10-CM | POA: Diagnosis not present

## 2023-08-18 DIAGNOSIS — I5020 Unspecified systolic (congestive) heart failure: Secondary | ICD-10-CM | POA: Diagnosis not present

## 2023-08-18 DIAGNOSIS — I25119 Atherosclerotic heart disease of native coronary artery with unspecified angina pectoris: Secondary | ICD-10-CM

## 2023-08-18 DIAGNOSIS — I10 Essential (primary) hypertension: Secondary | ICD-10-CM

## 2023-08-18 DIAGNOSIS — E782 Mixed hyperlipidemia: Secondary | ICD-10-CM

## 2023-08-18 NOTE — Patient Instructions (Addendum)
Medication Instructions:  .instucr  *If you need a refill on your cardiac medications before your next appointment, please call your pharmacy*   Lab Work: CBC, CMET prior to next visit If you have labs (blood work) drawn today and your tests are completely normal, you will receive your results only by: MyChart Message (if you have MyChart) OR A paper copy in the mail If you have any lab test that is abnormal or we need to change your treatment, we will call you to review the results.   Testing/Procedures: ECHO Your physician has requested that you have an echocardiogram. Echocardiography is a painless test that uses sound waves to create images of your heart. It provides your doctor with information about the size and shape of your heart and how well your heart's chambers and valves are working. This procedure takes approximately one hour. There are no restrictions for this procedure. Please do NOT wear cologne, perfume, aftershave, or lotions (deodorant is allowed). Please arrive 15 minutes prior to your appointment time.  Follow-Up: At [facility], you and your health needs are our priority.  As part of our continuing mission to provide you with exceptional heart care, we have created designated Provider Care Teams.  These Care Teams include your primary Cardiologist (physician) and Advanced Practice Providers (APPs -  Physician Assistants and Nurse Practitioners) who all work together to provide you with the care you need, when you need it.  We recommend signing up for the patient portal called "MyChart".  Sign up information is provided on this After Visit Summary.  MyChart is used to connect with patients for Virtual Visits (Telemedicine).  Patients are able to view lab/test results, encounter notes, upcoming appointments, etc.  Non-urgent messages can be sent to your provider as well.   To learn more about what you can do with MyChart, go to [email or web address].    Your  next appointment:   6 month(s)  Provider:   [name], MD
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
## 2023-09-11 ENCOUNTER — Other Ambulatory Visit: Payer: Self-pay | Admitting: Cardiovascular Disease
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
## 2023-09-29 ENCOUNTER — Ambulatory Visit: Payer: Medicare Other | Admitting: Family Medicine

## 2023-09-29 ENCOUNTER — Encounter: Payer: Self-pay | Admitting: Family Medicine

## 2023-09-29 ENCOUNTER — Other Ambulatory Visit: Payer: Self-pay

## 2023-09-29 VITALS — BP 116/78 | Ht 71.0 in | Wt 245.0 lb

## 2023-09-29 DIAGNOSIS — M25552 Pain in left hip: Secondary | ICD-10-CM

## 2023-09-29 DIAGNOSIS — M7062 Trochanteric bursitis, left hip: Secondary | ICD-10-CM

## 2023-09-29 DIAGNOSIS — M7061 Trochanteric bursitis, right hip: Secondary | ICD-10-CM | POA: Diagnosis not present

## 2023-09-29 DIAGNOSIS — M25551 Pain in right hip: Secondary | ICD-10-CM | POA: Diagnosis not present

## 2023-09-29 NOTE — Assessment & Plan Note (Signed)
Significant improvement after the injections.  At this point can follow-up as needed.

## 2023-09-30 ENCOUNTER — Telehealth: Payer: Self-pay

## 2023-09-30 MED ORDER — ENTRESTO 49-51 MG PO TABS: 1.0000 | ORAL_TABLET | Freq: Two times a day (BID) | ORAL | 3 refills | Status: DC

## 2023-09-30 NOTE — Telephone Encounter (Signed)
-----   Message from [name] sent at 09/30/2023  2:21 PM EDT ----- Wendie Simmer - I talked to him the other day and would like to increase his Entresto to 49/51 mg BID. Can you call that in for him? thanks

## 2023-10-27 ENCOUNTER — Other Ambulatory Visit: Payer: Self-pay | Admitting: Cardiovascular Disease

## 2023-10-28 ENCOUNTER — Telehealth: Payer: Self-pay

## 2023-10-28 MED ORDER — SACUBITRIL-VALSARTAN 49-51 MG PO TABS: 1.0000 | ORAL_TABLET | Freq: Two times a day (BID) | ORAL | 11 refills | Status: DC

## 2023-10-28 NOTE — Telephone Encounter (Signed)
Medication sent to local pharmacy [facility]). Chart shows current script active at [facility] mail order pharmacy. Pt made aware.

## 2023-10-28 NOTE — Telephone Encounter (Signed)
-----   Message from [name] sent at 10/28/2023  6:41 AM EST ----- Wendie Simmer - can you call in Entresto 49/51 mg BID for him? He texted and said he went to his pharmacy and they didn't have the Rx.  thx

## 2023-11-03 ENCOUNTER — Encounter: Payer: Medicare Other | Admitting: *Deleted

## 2023-11-03 DIAGNOSIS — Z006 Encounter for examination for normal comparison and control in clinical research program: Secondary | ICD-10-CM

## 2023-11-03 MED ORDER — STUDY - ORION 4 - INCLISIRAN 300 MG/1.5 ML OR PLACEBO SQ INJECTION (PI-STUCKEY)
300.0000 mg | INJECTION | SUBCUTANEOUS | Status: DC
  Administered 2023-11-03: 300 mg via SUBCUTANEOUS
  Filled 2023-11-03: qty 1.5

## 2023-11-03 NOTE — Research (Signed)
ORION 4  Patient doing well, no complaints of cp or sob.  No med changes per patient.   IP given   Will see patient back June 4th   Mercer Pod :) RN BSN  Clinical Research Nurse  Be strong and take heart, all you who hope in the [place]. ~ Psalm 31:24   Current Outpatient Medications:    cetirizine-pseudoephedrine (ZYRTEC-D) 5-120 MG tablet, Take 1 tablet by mouth 2 (two) times daily., Disp: , Rfl:    Cholecalciferol 25 MCG (1000 UT) capsule, Take by mouth., Disp: , Rfl:    cyanocobalamin (VITAMIN B12) 500 MCG tablet, Take by mouth., Disp: , Rfl:    empagliflozin (JARDIANCE) 25 MG TABS tablet, Take 25 mg by mouth daily., Disp: , Rfl:    glucose blood (CHOICE DM FORA G20 TEST STRIPS) test strip, Use as instructed, Disp: 100 each, Rfl: 12   glucose monitoring kit (FREESTYLE) monitoring kit, 1 each by Does not apply route as needed for other., Disp: 1 each, Rfl: 0   Lancets (ONETOUCH ULTRASOFT) lancets, Use as instructed, Disp: 100 each, Rfl: 12   latanoprost (XALATAN) 0.005 % ophthalmic solution, Place 1 drop into both eyes at bedtime., Disp: , Rfl:    metFORMIN (GLUCOPHAGE) 500 MG tablet, Take 500 mg by mouth 2 (two) times daily with a meal. , Disp: , Rfl:    metoprolol succinate (TOPROL-XL) 50 MG 24 hr tablet, Take 1 tablet (50 mg total) by mouth daily. Take with or immediately following a meal., Disp: 90 tablet, Rfl: 1   montelukast (SINGULAIR) 10 MG tablet, Take 1 tablet (10 mg total) by mouth at bedtime., Disp: 90 tablet, Rfl: 3   Multiple Vitamins-Minerals (MULTIVITAMIN WITH MINERALS) tablet, Take 1 tablet by mouth daily., Disp: , Rfl:    pantoprazole (PROTONIX) 40 MG tablet, Take 1 tablet (40 mg total) by mouth daily. Take 30-60 min before first meal of the day (Patient taking differently: Take 40 mg by mouth daily. Taking at bedtime), Disp: 90 tablet, Rfl: 3   pioglitazone (ACTOS) 30 MG tablet, Take 30 mg by mouth daily., Disp: , Rfl:    sacubitril-valsartan (ENTRESTO) 49-51  MG, Take 1 tablet by mouth 2 (two) times daily., Disp: 60 tablet, Rfl: 11   Study - ORION 4 - inclisiran 300 mg/1.7mL or placebo SQ injection (PI-Stuckey), Inject 1.5 mLs (300 mg total) into the skin every 6 (six) months., Disp: 1 mL, Rfl: 1   Travoprost, BAK Free, (TRAVATAN) 0.004 % SOLN ophthalmic solution, travoprost 0.004 % eye drops  INSTILL ONE DROP IN [facility] EYE AT BEDTIME, Disp: , Rfl:    triamcinolone cream (KENALOG) 0.1 %, Apply 1 application topically 2 (two) times daily as needed (for irritation). , Disp: , Rfl: 0  Current Facility-Administered Medications:    Study - ORION 4 - inclisiran 300 mg/1.67mL or placebo SQ injection (PI-Stuckey), 300 mg, Subcutaneous, Q6 months, , 300 mg at 11/03/23 1228

## 2024-02-02 ENCOUNTER — Ambulatory Visit (HOSPITAL_COMMUNITY): Payer: Medicare Other | Attending: Cardiology

## 2024-02-02 ENCOUNTER — Ambulatory Visit: Payer: Medicare Other

## 2024-02-02 DIAGNOSIS — I25119 Atherosclerotic heart disease of native coronary artery with unspecified angina pectoris: Secondary | ICD-10-CM

## 2024-02-02 DIAGNOSIS — I493 Ventricular premature depolarization: Secondary | ICD-10-CM

## 2024-02-02 DIAGNOSIS — E782 Mixed hyperlipidemia: Secondary | ICD-10-CM

## 2024-02-02 DIAGNOSIS — I5020 Unspecified systolic (congestive) heart failure: Secondary | ICD-10-CM | POA: Diagnosis present

## 2024-02-02 DIAGNOSIS — I10 Essential (primary) hypertension: Secondary | ICD-10-CM | POA: Diagnosis not present

## 2024-02-02 LAB — ECHOCARDIOGRAM COMPLETE
Area-P 1/2: 3.63 cm2
S' Lateral: 3.8 cm

## 2024-02-15 NOTE — Progress Notes (Unsigned)
 Cardiology Office Note:    Date:  02/16/2024   ID:  [name], DOB [birth year], MRN [id number]  PCP:  Burton Apley, MD   [facility] Providers Cardiologist:  [name], MD Electrophysiologist:  Lanier Prude, MD     Referring MD: Burton Apley, MD   Chief Complaint  Patient presents with   Shortness of Breath    History of Present Illness:    [name] is a 83 y.o. male with a hx of atrial fibrillation and coronary artery disease, presenting for follow-up evaluation.  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  He went on to develop atrial fibrillation with progressive LV dysfunction and multivessel coronary artery disease.  He ultimately was treated with multivessel CABG in 2014 with a LIMA to LAD and saphenous vein graft RCA as well as biatrial Maze procedure.  He has had no recurrence of atrial fibrillation and has been off of anticoagulation. Recent echo reviewed with modest improvement in LVEF 45-50% (previously 40-45%), no significant valvular disease.   He is here alone today. He's doing pretty well overall. Still under a lot of stress with his wife's declining health. Last year he was having some issues with chest discomfort but this has completely resolved without intervention. He declined further workup. He has mild dyspnea with exertion, but no edema, orthopnea, or PND. He's trying to be more active on a consistent basis to work on his endurance. No chest pain, chest pressure, lightheadedness, or heart palpitations.   Current Medications: Current Meds  Medication Sig   cetirizine-pseudoephedrine (ZYRTEC-D) 5-120 MG tablet Take 1 tablet by mouth 2 (two) times daily.   Cholecalciferol 25 MCG (1000 UT) capsule Take by mouth.   cyanocobalamin (VITAMIN B12) 500 MCG tablet Take by mouth.   empagliflozin (JARDIANCE) 25 MG TABS tablet Take 25 mg by mouth daily.   glucose blood (CHOICE DM FORA  G20 TEST STRIPS) test strip Use as instructed   glucose monitoring kit (FREESTYLE) monitoring kit 1 each by Does not apply route as needed for other.   Lancets (ONETOUCH ULTRASOFT) lancets Use as instructed   latanoprost (XALATAN) 0.005 % ophthalmic solution Place 1 drop into both eyes at bedtime.   metFORMIN (GLUCOPHAGE) 500 MG tablet Take 500 mg by mouth 2 (two) times daily with a meal.    metoprolol succinate (TOPROL-XL) 50 MG 24 hr tablet Take 1 tablet (50 mg total) by mouth daily. Take with or immediately following a meal.   montelukast (SINGULAIR) 10 MG tablet Take 1 tablet (10 mg total) by mouth at bedtime.   Multiple Vitamins-Minerals (MULTIVITAMIN WITH MINERALS) tablet Take 1 tablet by mouth daily.   pantoprazole (PROTONIX) 40 MG tablet Take 1 tablet (40 mg total) by mouth daily. Take 30-60 min before first meal of the day (Patient taking differently: Take 40 mg by mouth daily. Taking at bedtime)   pioglitazone (ACTOS) 30 MG tablet Take 30 mg by mouth daily.   sacubitril-valsartan (ENTRESTO) 49-51 MG Take 1 tablet by mouth 2 (two) times daily.   Study - ORION 4 - inclisiran 300 mg/1.53mL or placebo SQ injection (PI-Stuckey) Inject 1.5 mLs (300 mg total) into the skin every 6 (six) months.   Travoprost, BAK Free, (TRAVATAN) 0.004 % SOLN ophthalmic solution travoprost 0.004 % eye drops  INSTILL ONE DROP IN [facility] EYE AT BEDTIME   triamcinolone cream (KENALOG) 0.1 % Apply 1 application topically 2 (two) times daily as  needed (for irritation).    Current Facility-Administered Medications for the 02/16/24 encounter (Office Visit) with [name], MD  Medication   Study - ORION 4 - inclisiran 300 mg/1.38mL or placebo SQ injection (PI-Stuckey)     Allergies:   Asa [aspirin] and Statins   ROS:   Please see the history of present illness.    All other systems reviewed and are negative.  EKGs/Labs/Other Studies Reviewed:    The following studies were reviewed today: Cardiac Studies &  Procedures   ______________________________________________________________________________________________   STRESS TESTS  EXERCISE TOLERANCE TEST (ETT) 04/14/2018  Narrative  Blood pressure demonstrated a hypertensive response to exercise.  There was no ST segment deviation noted during stress.  No T wave inversion was noted during stress.  Negative, adequate stress test.   ECHOCARDIOGRAM  ECHOCARDIOGRAM COMPLETE 02/02/2024  Narrative ECHOCARDIOGRAM REPORT    Patient Name:   [name] Date of Exam: 02/02/2024 Medical Rec #:  [id number]         Height:       71.0 in Accession #:    [id number]        Weight:       245.0 lb Date of Birth:  16-Jan-1941         BSA:          2.298 m Patient Age:    82 years          BP:           116/78 mmHg Patient Gender: M                 HR:           86 bpm. Exam Location:  [address]  Procedure: 2D Echo, Cardiac Doppler and Color Doppler (Both Spectral and Color Flow Doppler were utilized during procedure).  Indications:    I50.9 CHF  History:        Patient has prior history of Echocardiogram examinations, most recent 02/03/2023. CHF, CAD and Previous Myocardial Infarction, Prior CABG and Abnormal ECG, Arrythmias:Atrial Fibrillation and PVC, Signs/Symptoms:Chest Pain; Risk Factors:Family History of Coronary Artery Disease, Hypertension, Dyslipidemia, Former Smoker, Sleep Apnea and Diabetes. CABG with Maze procedure (2014) complicated by Left Pleural Effusion, Heart Failure with Reduced EF (HFrEF- 40-45% Prior), Obesity, Ischemic Cardiomyopathy.  Sonographer:    Farrel Conners RDCS Referring Phys: RONALD ROBERTS  IMPRESSIONS   1. Hypokinesis of the basal inferior and inferolateral wall with overall mild LV dysfunction. 2. LVEF 3 D and strain not performed. Left ventricular ejection fraction, by estimation, is 45 to 50%. The left ventricle has mildly decreased function. The left ventricle demonstrates regional wall  motion abnormalities (see scoring diagram/findings for description). There is mild left ventricular hypertrophy. Left ventricular diastolic parameters are indeterminate. 3. Right ventricular systolic function is normal. The right ventricular size is normal. 4. Left atrial size was moderately dilated. 5. Right atrial size was moderately dilated. 6. The mitral valve is normal in structure. Mild mitral valve regurgitation. No evidence of mitral stenosis. 7. The aortic valve is tricuspid. Aortic valve regurgitation is not visualized. No aortic stenosis is present. 8. The inferior vena cava is normal in size with greater than 50% respiratory variability, suggesting right atrial pressure of 3 mmHg.  FINDINGS Left Ventricle: LVEF 3 D and strain not performed. Left ventricular ejection fraction, by estimation, is 45 to 50%. The left ventricle has mildly decreased function. The left ventricle demonstrates regional wall motion abnormalities. Strain was performed and the global longitudinal  strain is indeterminate. The left ventricular internal cavity size was normal in size. There is mild left ventricular hypertrophy. Left ventricular diastolic parameters are indeterminate.  Right Ventricle: The right ventricular size is normal. Right ventricular systolic function is normal.  Left Atrium: Left atrial size was moderately dilated.  Right Atrium: Right atrial size was moderately dilated.  Pericardium: There is no evidence of pericardial effusion.  Mitral Valve: The mitral valve is normal in structure. Mild mitral valve regurgitation. No evidence of mitral valve stenosis.  Tricuspid Valve: The tricuspid valve is normal in structure. Tricuspid valve regurgitation is mild . No evidence of tricuspid stenosis.  Aortic Valve: The aortic valve is tricuspid. Aortic valve regurgitation is not visualized. No aortic stenosis is present.  Pulmonic Valve: The pulmonic valve was normal in structure. Pulmonic valve  regurgitation is trivial. No evidence of pulmonic stenosis.  Aorta: The aortic root is normal in size and structure.  Venous: The inferior vena cava is normal in size with greater than 50% respiratory variability, suggesting right atrial pressure of 3 mmHg.  IAS/Shunts: No atrial level shunt detected by color flow Doppler.  Additional Comments: Hypokinesis of the basal inferior and inferolateral wall with overall mild LV dysfunction. 3D was performed not requiring image post processing on an independent workstation and was indeterminate.   LEFT VENTRICLE PLAX 2D LVIDd:         5.00 cm   Diastology LVIDs:         3.80 cm   LV e' medial:    9.57 cm/s LV PW:         1.20 cm   LV E/e' medial:  7.4 LV IVS:        1.10 cm   LV e' lateral:   9.79 cm/s LVOT diam:     2.60 cm   LV E/e' lateral: 7.2 LV SV:         76 LV SV Index:   33 LVOT Area:     5.31 cm   RIGHT VENTRICLE RV Basal diam:  4.30 cm RV Mid diam:    3.20 cm RV S prime:     8.92 cm/s TAPSE (M-mode): 1.2 cm RVSP:           27.8 mmHg  LEFT ATRIUM              Index        RIGHT ATRIUM           Index LA diam:        3.90 cm  1.70 cm/m   RA Pressure: 3.00 mmHg LA Vol (A2C):   107.0 ml 46.55 ml/m  RA Area:     28.60 cm LA Vol (A4C):   82.9 ml  36.07 ml/m  RA Volume:   99.30 ml  43.20 ml/m LA Biplane Vol: 96.9 ml  42.16 ml/m AORTIC VALVE LVOT Vmax:   80.00 cm/s LVOT Vmean:  57.900 cm/s LVOT VTI:    0.143 m  AORTA Ao Root diam: 3.60 cm Ao Asc diam:  3.50 cm  MITRAL VALVE               TRICUSPID VALVE MV Area (PHT): cm         TR Peak grad:   24.8 mmHg MV Decel Time: 209 msec    TR Vmax:        249.00 cm/s MV E velocity: 70.45 cm/s  Estimated RAP:  3.00 mmHg MV A velocity: 25.20 cm/s  RVSP:  27.8 mmHg MV E/A ratio:  2.80 SHUNTS Systemic VTI:  0.14 m Systemic Diam: 2.60 cm  Olga Millers MD Electronically signed by Olga Millers MD Signature Date/Time: 02/02/2024/12:51:29 PM    Final     MONITORS  LONG TERM MONITOR (3-14 DAYS) 09/13/2021  Narrative Patch Wear Time:  3 days and 0 hours (2022-10-02T20:05:16-0400 to 2022-10-05T20:19:43-0400)  Patient had a min HR of 48 bpm, max HR of 179 bpm, and avg HR of 80 bpm. Predominant underlying rhythm was Sinus Rhythm. 14 Ventricular Tachycardia runs occurred, the run with the fastest interval lasting 4 beats with a max rate of 179 bpm, the longest lasting 5 beats with an avg rate of 115 bpm. Isolated SVEs were rare (<1.0%), SVE Couplets were rare (<1.0%), and no SVE Triplets were present. Isolated VEs were frequent (21.4%, 71418), VE Couplets were frequent (5.1%, 8462), and VE Triplets were rare (<1.0%, 148). Ventricular Bigeminy and Trigeminy were present.  SUMMARY 1. The basic rhythm is normal sinus with an average HR of 80 bpm 2. No atrial fibrillation or flutter 3. No high-grade heart block or pathologic pauses 4. There are frequent PVC's with an overall burden of 21% and frequent ventricular couplets with a burden of 5%       ______________________________________________________________________________________________      EKG:   EKG Interpretation Date/Time:  Tuesday February 16 2024 09:01:09 EDT Ventricular Rate:  86 PR Interval:  168 QRS Duration:  148 QT Interval:  408 QTC Calculation: 488 R Axis:   28  Text Interpretation: Normal sinus rhythm Right bundle branch block Inferior infarct , age undetermined When compared with ECG of 26-Jun-2020 19:15, PREVIOUS ECG IS PRESENTRBBB is new Confirmed by [name] [phone]) on 02/16/2024 9:30:17 AM    Recent Labs: No results found for requested labs within last 365 days.  Recent Lipid Panel    Component Value Date/Time   CHOL 202 (H) 11/11/2017 0244   TRIG 28 11/11/2017 0244   HDL 44 11/11/2017 0244   CHOLHDL 4.6 11/11/2017 0244   VLDL 6 11/11/2017 0244   LDLCALC 152 (H) 11/11/2017 0244     Risk Assessment/Calculations:                Physical Exam:     VS:  BP 120/80   Pulse 86   Ht 5\' 11"  (1.803 m)   Wt 233 lb 3.2 oz (105.8 kg)   SpO2 95%   BMI 32.52 kg/m     Wt Readings from Last 3 Encounters:  02/16/24 233 lb 3.2 oz (105.8 kg)  09/29/23 245 lb (111.1 kg)  08/18/23 244 lb 12.8 oz (111 kg)     GEN:  Well nourished, well developed in no acute distress HEENT: Normal NECK: No JVD; No carotid bruits LYMPHATICS: No lymphadenopathy CARDIAC: RRR, no murmurs, rubs, gallops RESPIRATORY:  Clear to auscultation without rales, wheezing or rhonchi  ABDOMEN: Soft, non-tender, non-distended MUSCULOSKELETAL:  No edema; No deformity  SKIN: Warm and dry NEUROLOGIC:  Alert and oriented x 3 PSYCHIATRIC:  Normal affect   Assessment & Plan HFrEF (heart failure with reduced ejection fraction) (HCC) LVEF 45-50% on most recent echo (improved from past). Mild MR, no other significant valvular disease. Coronary artery disease involving native coronary artery of native heart with angina pectoris (HCC) Pt has been stable on beta-blocker Rx. We have discussed cardiac cath but anginal symptoms seem to be less prominent now, appropriate for ongoing medical therapy.  Mixed hyperlipidemia Treated through the ORION-4 study protocol. Statin intolerant.  Essential hypertension Blood pressure is well-controlled on Entresto and metoprolol succinate.  We are going to send for his labs from his primary care physician who follows labs every 3 months. PVC (premature ventricular contraction) Past evaluation by EP. Amiodarone recommended but patient declined. Appears to be asymptomatic and LVEF stable/improved. Continue beta blocker.  His EKG today is likely sinus rhythm but P waves are difficult to discern.  He is status post maze surgery and I do not appreciate any significant change other than the right bundle branch block now being complete compared to the incomplete right bundle branch block in the past.     Medication Adjustments/Labs and Tests  Ordered: Current medicines are reviewed at length with the patient today.  Concerns regarding medicines are outlined above.  Orders Placed This Encounter  Procedures   EKG 12-Lead   No orders of the defined types were placed in this encounter.   Patient Instructions  Follow-Up: At [facility], you and your health needs are our priority.  As part of our continuing mission to provide you with exceptional heart care, we have created designated Provider Care Teams.  These Care Teams include your primary Cardiologist (physician) and Advanced Practice Providers (APPs -  Physician Assistants and Nurse Practitioners) who all work together to provide you with the care you need, when you need it.  Your next appointment:   6 month(s)  Provider:   [name], MD      1st Floor: - Lobby - Registration  - Pharmacy  - Lab - Cafe  2nd Floor: - PV Lab - Diagnostic Testing (echo, CT, nuclear med)  3rd Floor: - Vacant  4th Floor: - TCTS (cardiothoracic surgery) - AFib Clinic - Structural Heart Clinic - Vascular Surgery  - Vascular Ultrasound  5th Floor: - HeartCare Cardiology (general and EP) - Clinical Pharmacy for coumadin, hypertension, lipid, weight-loss medications, and med management appointments    Valet parking services will be available as well.     Signed, [name], MD  02/16/2024 9:43 AM    [facility]

## 2024-02-16 ENCOUNTER — Encounter: Payer: Self-pay | Admitting: Cardiovascular Disease

## 2024-02-16 ENCOUNTER — Ambulatory Visit: Payer: Medicare Other | Attending: Cardiovascular Disease | Admitting: Cardiovascular Disease

## 2024-02-16 VITALS — BP 120/80 | HR 86 | Ht 71.0 in | Wt 233.2 lb

## 2024-02-16 DIAGNOSIS — I5020 Unspecified systolic (congestive) heart failure: Secondary | ICD-10-CM | POA: Diagnosis not present

## 2024-02-16 DIAGNOSIS — I493 Ventricular premature depolarization: Secondary | ICD-10-CM

## 2024-02-16 DIAGNOSIS — I10 Essential (primary) hypertension: Secondary | ICD-10-CM

## 2024-02-16 DIAGNOSIS — I25119 Atherosclerotic heart disease of native coronary artery with unspecified angina pectoris: Secondary | ICD-10-CM | POA: Diagnosis not present

## 2024-02-16 DIAGNOSIS — E782 Mixed hyperlipidemia: Secondary | ICD-10-CM

## 2024-02-16 NOTE — Assessment & Plan Note (Signed)
 Treated through the ORION-4 study protocol. Statin intolerant.

## 2024-02-16 NOTE — Patient Instructions (Signed)
 Follow-Up: At [facility], you and your health needs are our priority.  As part of our continuing mission to provide you with exceptional heart care, we have created designated Provider Care Teams.  These Care Teams include your primary Cardiologist (physician) and Advanced Practice Providers (APPs -  Physician Assistants and Nurse Practitioners) who all work together to provide you with the care you need, when you need it.  Your next appointment:   6 month(s)  Provider:   [name], MD   1st Floor: - Lobby - Registration  - Pharmacy  - Lab - Cafe  2nd Floor: - PV Lab - Diagnostic Testing (echo, CT, nuclear med)  3rd Floor: - Vacant  4th Floor: - TCTS (cardiothoracic surgery) - AFib Clinic - Structural Heart Clinic - Vascular Surgery  - Vascular Ultrasound  5th Floor: - HeartCare Cardiology (general and EP) - Clinical Pharmacy for coumadin, hypertension, lipid, weight-loss medications, and med management appointments    Valet parking services will be available as well.

## 2024-05-03 ENCOUNTER — Encounter: Admitting: *Deleted

## 2024-05-03 DIAGNOSIS — Z006 Encounter for examination for normal comparison and control in clinical research program: Secondary | ICD-10-CM

## 2024-05-03 MED ORDER — STUDY - ORION 4 - INCLISIRAN 300 MG/1.5 ML OR PLACEBO SQ INJECTION (PI-STUCKEY)
300.0000 mg | INJECTION | SUBCUTANEOUS | Status: AC
  Administered 2024-05-03: 300 mg via SUBCUTANEOUS
  Filled 2024-05-03: qty 1.5

## 2024-05-03 NOTE — Research (Signed)
Orion 4  Patient doing well, he lost his beautiful bride back in March 2025, but he is at peace, because he knows she has gone to be with the Lord.  He is feeling good, no cp or sob.  IP given Next app in Dec. 2025   Current Outpatient Medications:    cetirizine-pseudoephedrine (ZYRTEC-D) 5-120 MG tablet, Take 1 tablet by mouth 2 (two) times daily., Disp: , Rfl:    Cholecalciferol  25 MCG (1000 UT) capsule, Take by mouth., Disp: , Rfl:    cyanocobalamin  (VITAMIN B12) 500 MCG tablet, Take by mouth., Disp: , Rfl:    empagliflozin  (JARDIANCE ) 25 MG TABS tablet, Take 25 mg by mouth daily., Disp: , Rfl:    gabapentin  (NEURONTIN ) 100 MG capsule, Take 200 mg by mouth 2 (two) times daily as needed., Disp: , Rfl:    glucose blood (CHOICE DM FORA G20 TEST STRIPS) test strip, Use as instructed, Disp: 100 each, Rfl: 12   glucose monitoring kit (FREESTYLE) monitoring kit, 1 each by Does not apply route as needed for other., Disp: 1 each, Rfl: 0   ketorolac  (ACULAR ) 0.5 % ophthalmic solution, Place 1 drop into the right eye 4 (four) times daily., Disp: , Rfl:    Lancets (ONETOUCH ULTRASOFT) lancets, Use as instructed, Disp: 100 each, Rfl: 12   latanoprost  (XALATAN ) 0.005 % ophthalmic solution, Place 1 drop into both eyes at bedtime., Disp: , Rfl:    metFORMIN  (GLUCOPHAGE ) 500 MG tablet, Take 500 mg by mouth 2 (two) times daily with a meal. , Disp: , Rfl:    metoprolol  succinate (TOPROL -XL) 50 MG 24 hr tablet, Take 1 tablet (50 mg total) by mouth daily. Take with or immediately following a meal., Disp: 90 tablet, Rfl: 1   montelukast  (SINGULAIR ) 10 MG tablet, Take 1 tablet (10 mg total) by mouth at bedtime., Disp: 90 tablet, Rfl: 3   moxifloxacin (VIGAMOX) 0.5 % ophthalmic solution, Place 1 drop into the right eye 4 (four) times daily., Disp: , Rfl:    Multiple Vitamins-Minerals (MULTIVITAMIN WITH MINERALS) tablet, Take 1 tablet by mouth daily., Disp: , Rfl:    pantoprazole  (PROTONIX ) 40 MG tablet, Take 1  tablet (40 mg total) by mouth daily. Take 30-60 min before first meal of the day (Patient taking differently: Take 40 mg by mouth daily. Taking at bedtime), Disp: 90 tablet, Rfl: 3   pioglitazone  (ACTOS ) 30 MG tablet, Take 30 mg by mouth daily., Disp: , Rfl:    prednisoLONE acetate (PRED FORTE) 1 % ophthalmic suspension, Place 1 drop into the right eye 4 (four) times daily., Disp: , Rfl:    RYBELSUS 7 MG TABS, Take 1 tablet by mouth daily., Disp: , Rfl:    sacubitril -valsartan  (ENTRESTO ) 49-51 MG, Take 1 tablet by mouth 2 (two) times daily., Disp: 60 tablet, Rfl: 11   Study - ORION 4 - inclisiran 300 mg/1.5mL or placebo SQ injection (PI-Stuckey), Inject 1.5 mLs (300 mg total) into the skin every 6 (six) months., Disp: 1 mL, Rfl: 1   Travoprost, BAK Free, (TRAVATAN) 0.004 % SOLN ophthalmic solution, travoprost 0.004 % eye drops  INSTILL ONE DROP IN EACH EYE AT BEDTIME, Disp: , Rfl:    triamcinolone  cream (KENALOG ) 0.1 %, Apply 1 application topically 2 (two) times daily as needed (for irritation). , Disp: , Rfl: 0  Current Facility-Administered Medications:    Study - ORION 4 - inclisiran 300 mg/1.5mL or placebo SQ injection (PI-Stuckey), 300 mg, Subcutaneous, Q6 months, , 300 mg at 05/03/24  0958  

## 2024-06-04 ENCOUNTER — Other Ambulatory Visit: Payer: Self-pay | Admitting: Cardiovascular Disease

## 2024-08-03 ENCOUNTER — Other Ambulatory Visit (HOSPITAL_COMMUNITY): Payer: Self-pay

## 2024-08-03 ENCOUNTER — Telehealth: Payer: Self-pay | Admitting: Pharmacy Technician

## 2024-08-03 NOTE — Telephone Encounter (Signed)
   NO PA NEEDED TOO SOON UNTIL 08/10/24

## 2024-08-19 ENCOUNTER — Telehealth: Payer: Self-pay | Admitting: Pharmacy Technician

## 2024-08-19 ENCOUNTER — Other Ambulatory Visit (HOSPITAL_COMMUNITY): Payer: Self-pay

## 2024-08-19 NOTE — Telephone Encounter (Signed)
   Pharmacy Patient Advocate Encounter   Received notification from Onbase that prior authorization for entresto  49/51mg  is required/requested.   Insurance verification completed.   The patient is insured through [facility] .   Per test claim: PA required; PA submitted to above mentioned insurance via Latent Key/confirmation #/EOC [id number] Status is pending

## 2024-08-22 NOTE — Telephone Encounter (Signed)
 Pharmacy Patient Advocate Encounter  Received notification from [facility] that Prior Authorization for entresto  has been DENIED.  Full denial letter will be uploaded to the media tab. See denial reason below.       He was just wanting to stay on brand. Insurance is requiring generic. Coupon for generic for 45.00 for 30 days. Pharmacy filling generic

## 2024-09-05 ENCOUNTER — Encounter: Payer: Self-pay | Admitting: Cardiovascular Disease

## 2024-09-28 ENCOUNTER — Encounter: Payer: Self-pay | Admitting: Cardiovascular Disease

## 2024-09-28 ENCOUNTER — Telehealth: Payer: Self-pay | Admitting: Cardiovascular Disease

## 2024-09-28 ENCOUNTER — Ambulatory Visit: Attending: Cardiovascular Disease | Admitting: Cardiovascular Disease

## 2024-09-28 VITALS — BP 125/72 | HR 87 | Ht 71.0 in | Wt 245.0 lb

## 2024-09-28 DIAGNOSIS — I10 Essential (primary) hypertension: Secondary | ICD-10-CM

## 2024-09-28 DIAGNOSIS — E782 Mixed hyperlipidemia: Secondary | ICD-10-CM | POA: Diagnosis not present

## 2024-09-28 DIAGNOSIS — I25119 Atherosclerotic heart disease of native coronary artery with unspecified angina pectoris: Secondary | ICD-10-CM | POA: Diagnosis not present

## 2024-09-28 DIAGNOSIS — I5022 Chronic systolic (congestive) heart failure: Secondary | ICD-10-CM | POA: Diagnosis not present

## 2024-09-28 NOTE — Assessment & Plan Note (Signed)
 Followed through the ORION 4 Trial (inclisiran or placebo) study protocol

## 2024-09-28 NOTE — Patient Instructions (Addendum)
 Medication Instructions:  Your physician recommends that you continue on your current medications as directed. Please refer to the Current Medication list given to you today.  *If you need a refill on your cardiac medications before your next appointment, please call your pharmacy*  Lab Work: None ordered.  You may go to any [facility] Location for your lab work:  [facility] - [address] ([facility]) - [address] [phone] [address]  [place] - [address]   [place]  - [address]   [place] - [address] - [address] [employer]  - [address] [place] - [address] ([facility]   If you have labs (blood work) drawn today and your tests are completely normal, you will receive your results only by: [employer] (if you have MyChart)  If you have any lab test that is abnormal or we need to change your treatment, we will call you or send a MyChart message to review the results.  Testing/Procedures: None ordered.  Follow-Up: At [facility], you and your health needs are our priority.  As part of our continuing mission to provide you with exceptional heart care, we have created designated Provider Care Teams.  These Care Teams include your primary Cardiologist (physician) and Advanced Practice Providers (APPs -  Physician Assistants and Nurse Practitioners) who all work together to provide you with the care you need, when you need it.  We recommend signing up for the patient portal called MyChart.  Sign up information is provided on this After Visit Summary.  MyChart is used to connect with patients for Virtual Visits (Telemedicine).  Patients are able to view lab/test results, encounter notes, upcoming appointments, etc.  Non-urgent messages can be sent to your provider as well.   To learn more about what you can do with MyChart, go to  [email or web address].    Your next appointment:   6 months  The format for your next appointment:   In Person  Provider:   Ozell Fell, MD

## 2024-09-28 NOTE — Telephone Encounter (Signed)
 Spoke with pt, aware unless someone no shows. There have been no cancellations.

## 2024-09-28 NOTE — Progress Notes (Signed)
 Cardiology Office Note:    Date:  09/28/2024   ID:  [name], DOB [birth year], MRN [id number]  PCP:  Richard Ingle, MD   [facility] Providers Cardiologist:  Richard Fell, MD Electrophysiologist:  Richard ONEIDA HOLTS, MD     Referring MD: Richard Ingle, MD   Chief Complaint  Patient presents with   Shortness of Breath    History of Present Illness:    [name] is a 83 y.o. male with a hx of atrial fibrillation and coronary artery disease, presenting for follow-up evaluation.  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  He went on to develop atrial fibrillation with progressive LV dysfunction and multivessel coronary artery disease.  He ultimately was treated with multivessel CABG in 2014 with a LIMA to LAD and saphenous vein graft RCA as well as biatrial Maze procedure.  He has had no recurrence of atrial fibrillation and has been off of anticoagulation.  The patient's most recent echocardiogram in March 2025 shows hypokinesis of the basal inferior and inferolateral walls with LVEF estimated at 45 to 50% and mild LVH.  RV function is normal.  There is mild mitral regurgitation and no aortic stenosis.  The patient is here alone today.  He reports some mild swelling in his legs on he is going to talk with Dr. Henry about potentially discontinuing pioglitazone .  He denies orthopnea, PND, or chest pain.  He has no chest pressure.  He has mild exertional dyspnea where he has to stop and rest with walking.  He reports no change in this, and states that he has not been able to exercise regularly over the past few years.  The patient's wife passed away this spring after a long illness.   Current Medications: Current Meds  Medication Sig   cetirizine-pseudoephedrine (ZYRTEC-D) 5-120 MG tablet Take 1 tablet by mouth 2 (two) times daily.   Cholecalciferol  25 MCG (1000 UT) capsule Take by mouth.   cyanocobalamin   (VITAMIN B12) 500 MCG tablet Take by mouth.   empagliflozin  (JARDIANCE ) 25 MG TABS tablet Take 25 mg by mouth daily.   gabapentin  (NEURONTIN ) 100 MG capsule Take 200 mg by mouth 2 (two) times daily as needed.   glucose blood (CHOICE DM FORA G20 TEST STRIPS) test strip Use as instructed   glucose monitoring kit (FREESTYLE) monitoring kit 1 each by Does not apply route as needed for other.   Lancets (ONETOUCH ULTRASOFT) lancets Use as instructed   latanoprost  (XALATAN ) 0.005 % ophthalmic solution Place 1 drop into both eyes at bedtime.   losartan  (COZAAR ) 50 MG tablet daily.   metFORMIN  (GLUCOPHAGE ) 500 MG tablet Take 500 mg by mouth 2 (two) times daily with a meal.    metoprolol  succinate (TOPROL -XL) 50 MG 24 hr tablet Take 1 tablet (50 mg total) by mouth daily. Take with or immediately following a meal.   montelukast  (SINGULAIR ) 10 MG tablet Take 1 tablet (10 mg total) by mouth at bedtime.   Multiple Vitamins-Minerals (MULTIVITAMIN WITH MINERALS) tablet Take 1 tablet by mouth daily.   pantoprazole  (PROTONIX ) 40 MG tablet Take 1 tablet (40 mg total) by mouth daily. Take 30-60 min before first meal of the day (Patient taking differently: Take 40 mg by mouth daily. Taking at bedtime)   pioglitazone  (ACTOS ) 30 MG tablet Take 30 mg by mouth daily.   RYBELSUS 7 MG TABS Take 1 tablet by mouth daily.   sacubitril -valsartan  (ENTRESTO ) 49-51  MG Take 1 tablet by mouth 2 (two) times daily.   Study - ORION 4 - inclisiran 300 mg/1.28mL or placebo SQ injection (PI-Stuckey) Inject 1.5 mLs (300 mg total) into the skin every 6 (six) months.   Travoprost, BAK Free, (TRAVATAN) 0.004 % SOLN ophthalmic solution travoprost 0.004 % eye drops  INSTILL ONE DROP IN EACH EYE AT BEDTIME   triamcinolone  cream (KENALOG ) 0.1 % Apply 1 application topically 2 (two) times daily as needed (for irritation).    Current Facility-Administered Medications for the 09/28/24 encounter (Office Visit) with Richard Sharper, MD  Medication    Study - ORION 4 - inclisiran 300 mg/1.5mL or placebo SQ injection (PI-Stuckey)     Allergies:   Asa [aspirin ] and Statins   ROS:   Please see the history of present illness.    All other systems reviewed and are negative.  EKGs/Labs/Other Studies Reviewed:    The following studies were reviewed today: Cardiac Studies & Procedures   ______________________________________________________________________________________________   STRESS TESTS  EXERCISE TOLERANCE TEST (ETT) 04/14/2018  Interpretation Summary  Blood pressure demonstrated a hypertensive response to exercise.  There was no ST segment deviation noted during stress.  No T wave inversion was noted during stress.  Negative, adequate stress test.   ECHOCARDIOGRAM  ECHOCARDIOGRAM COMPLETE 02/02/2024  Narrative ECHOCARDIOGRAM REPORT    Patient Name:   [name] Date of Exam: 02/02/2024 Medical Rec #:  [id number]         Height:       71.0 in Accession #:    [id number]        Weight:       245.0 lb Date of Birth:  06/20/1941         BSA:          2.298 m Patient Age:    82 years          BP:           116/78 mmHg Patient Gender: M                 HR:           86 bpm. Exam Location:  [address]  Procedure: 2D Echo, Cardiac Doppler and Color Doppler (Both Spectral and Color Flow Doppler were utilized during procedure).  Indications:    I50.9 CHF  History:        Patient has prior history of Echocardiogram examinations, most recent 02/03/2023. CHF, CAD and Previous Myocardial Infarction, Prior CABG and Abnormal ECG, Arrythmias:Atrial Fibrillation and PVC, Signs/Symptoms:Chest Pain; Risk Factors:Family History of Coronary Artery Disease, Hypertension, Dyslipidemia, Former Smoker, Sleep Apnea and Diabetes. CABG with Maze procedure (2014) complicated by Left Pleural Effusion, Heart Failure with Reduced EF (HFrEF- 40-45% Prior), Obesity, Ischemic Cardiomyopathy.  Sonographer:    Heather Hawks  RDCS Referring Phys: [name]  IMPRESSIONS   1. Hypokinesis of the basal inferior and inferolateral wall with overall mild LV dysfunction. 2. LVEF 3 D and strain not performed. Left ventricular ejection fraction, by estimation, is 45 to 50%. The left ventricle has mildly decreased function. The left ventricle demonstrates regional wall motion abnormalities (see scoring diagram/findings for description). There is mild left ventricular hypertrophy. Left ventricular diastolic parameters are indeterminate. 3. Right ventricular systolic function is normal. The right ventricular size is normal. 4. Left atrial size was moderately dilated. 5. Right atrial size was moderately dilated. 6. The mitral valve is normal in structure. Mild mitral valve regurgitation. No evidence of mitral stenosis. 7. The  aortic valve is tricuspid. Aortic valve regurgitation is not visualized. No aortic stenosis is present. 8. The inferior vena cava is normal in size with greater than 50% respiratory variability, suggesting right atrial pressure of 3 mmHg.  FINDINGS Left Ventricle: LVEF 3 D and strain not performed. Left ventricular ejection fraction, by estimation, is 45 to 50%. The left ventricle has mildly decreased function. The left ventricle demonstrates regional wall motion abnormalities. Strain was performed and the global longitudinal strain is indeterminate. The left ventricular internal cavity size was normal in size. There is mild left ventricular hypertrophy. Left ventricular diastolic parameters are indeterminate.  Right Ventricle: The right ventricular size is normal. Right ventricular systolic function is normal.  Left Atrium: Left atrial size was moderately dilated.  Right Atrium: Right atrial size was moderately dilated.  Pericardium: There is no evidence of pericardial effusion.  Mitral Valve: The mitral valve is normal in structure. Mild mitral valve regurgitation. No evidence of mitral valve  stenosis.  Tricuspid Valve: The tricuspid valve is normal in structure. Tricuspid valve regurgitation is mild . No evidence of tricuspid stenosis.  Aortic Valve: The aortic valve is tricuspid. Aortic valve regurgitation is not visualized. No aortic stenosis is present.  Pulmonic Valve: The pulmonic valve was normal in structure. Pulmonic valve regurgitation is trivial. No evidence of pulmonic stenosis.  Aorta: The aortic root is normal in size and structure.  Venous: The inferior vena cava is normal in size with greater than 50% respiratory variability, suggesting right atrial pressure of 3 mmHg.  IAS/Shunts: No atrial level shunt detected by color flow Doppler.  Additional Comments: Hypokinesis of the basal inferior and inferolateral wall with overall mild LV dysfunction. 3D was performed not requiring image post processing on an independent workstation and was indeterminate.   LEFT VENTRICLE PLAX 2D LVIDd:         5.00 cm   Diastology LVIDs:         3.80 cm   LV e' medial:    9.57 cm/s LV PW:         1.20 cm   LV E/e' medial:  7.4 LV IVS:        1.10 cm   LV e' lateral:   9.79 cm/s LVOT diam:     2.60 cm   LV E/e' lateral: 7.2 LV SV:         76 LV SV Index:   33 LVOT Area:     5.31 cm   RIGHT VENTRICLE RV Basal diam:  4.30 cm RV Mid diam:    3.20 cm RV S prime:     8.92 cm/s TAPSE (M-mode): 1.2 cm RVSP:           27.8 mmHg  LEFT ATRIUM              Index        RIGHT ATRIUM           Index LA diam:        3.90 cm  1.70 cm/m   RA Pressure: 3.00 mmHg LA Vol (A2C):   107.0 ml 46.55 ml/m  RA Area:     28.60 cm LA Vol (A4C):   82.9 ml  36.07 ml/m  RA Volume:   99.30 ml  43.20 ml/m LA Biplane Vol: 96.9 ml  42.16 ml/m AORTIC VALVE LVOT Vmax:   80.00 cm/s LVOT Vmean:  57.900 cm/s LVOT VTI:    0.143 m  AORTA Ao Root diam: 3.60 cm Ao Asc diam:  3.50 cm  MITRAL VALVE               TRICUSPID VALVE MV Area (PHT): cm         TR Peak grad:   24.8 mmHg MV Decel Time: 209  msec    TR Vmax:        249.00 cm/s MV E velocity: 70.45 cm/s  Estimated RAP:  3.00 mmHg MV A velocity: 25.20 cm/s  RVSP:           27.8 mmHg MV E/A ratio:  2.80 SHUNTS Systemic VTI:  0.14 m Systemic Diam: 2.60 cm  Redell Shallow MD Electronically signed by Redell Shallow MD Signature Date/Time: 02/02/2024/12:51:29 PM    Final    MONITORS  LONG TERM MONITOR (3-14 DAYS) 09/13/2021  Narrative Patch Wear Time:  3 days and 0 hours (2022-10-02T20:05:16-0400 to 2022-10-05T20:19:43-0400)  Patient had a min HR of 48 bpm, max HR of 179 bpm, and avg HR of 80 bpm. Predominant underlying rhythm was Sinus Rhythm. 14 Ventricular Tachycardia runs occurred, the run with the fastest interval lasting 4 beats with a max rate of 179 bpm, the longest lasting 5 beats with an avg rate of 115 bpm. Isolated SVEs were rare (<1.0%), SVE Couplets were rare (<1.0%), and no SVE Triplets were present. Isolated VEs were frequent (21.4%, 71418), VE Couplets were frequent (5.1%, 8462), and VE Triplets were rare (<1.0%, 148). Ventricular Bigeminy and Trigeminy were present.  SUMMARY 1. The basic rhythm is normal sinus with an average HR of 80 bpm 2. No atrial fibrillation or flutter 3. No high-grade heart block or pathologic pauses 4. There are frequent PVC's with an overall burden of 21% and frequent ventricular couplets with a burden of 5%       ______________________________________________________________________________________________      EKG:        Recent Labs: No results found for requested labs within last 365 days.  Recent Lipid Panel    Component Value Date/Time   CHOL 202 (H) 11/11/2017 0244   TRIG 28 11/11/2017 0244   HDL 44 11/11/2017 0244   CHOLHDL 4.6 11/11/2017 0244   VLDL 6 11/11/2017 0244   LDLCALC 152 (H) 11/11/2017 0244     Risk Assessment/Calculations:                Physical Exam:    VS:  BP 125/72   Pulse 87   Ht 5' 11 (1.803 m)   Wt 245 lb (111.1 kg)    SpO2 96%   BMI 34.17 kg/m     Wt Readings from Last 3 Encounters:  09/28/24 245 lb (111.1 kg)  02/16/24 233 lb 3.2 oz (105.8 kg)  09/29/23 245 lb (111.1 kg)     GEN:  Well nourished, well developed in no acute distress HEENT: Normal NECK: No JVD; No carotid bruits LYMPHATICS: No lymphadenopathy CARDIAC: RRR, no murmurs, rubs, gallops RESPIRATORY:  Clear to auscultation without rales, wheezing or rhonchi  ABDOMEN: Soft, non-tender, non-distended MUSCULOSKELETAL:  No edema; No deformity  SKIN: Warm and dry NEUROLOGIC:  Alert and oriented x 3 PSYCHIATRIC:  Normal affect   Assessment & Plan Chronic heart failure with mildly reduced ejection fraction (HFmrEF, 41-49%) (HCC) The patient appears clinically stable with mild functional limitation, NYHA functional class II symptoms reported.  He continues on Jardiance , Entresto , and metoprolol  succinate.  He will discuss discontinuation of pioglitazone  with Dr. Henry.  I will see him back in 6 months. Essential hypertension Blood pressure well-controlled on current  medical regimen. Mixed hyperlipidemia Followed through the ORION 4 Trial (inclisiran or placebo) study protocol Coronary artery disease involving native coronary artery of native heart with angina pectoris Patient is currently stable on metoprolol  succinate for antianginal therapy.            Medication Adjustments/Labs and Tests Ordered: Current medicines are reviewed at length with the patient today.  Concerns regarding medicines are outlined above.  No orders of the defined types were placed in this encounter.  No orders of the defined types were placed in this encounter.   Patient Instructions  Medication Instructions:  Your physician recommends that you continue on your current medications as directed. Please refer to the Current Medication list given to you today.  *If you need a refill on your cardiac medications before your next appointment, please call your  pharmacy*  Lab Work: None ordered.  You may go to any [facility] Location for your lab work:  [facility] - [address] ([facility]) - [address] [phone] [address]  [place] - [address]   [place]  - [address]   [place] - [address] - [address] [employer]  - [address] [place] - [address] ([facility]   If you have labs (blood work) drawn today and your tests are completely normal, you will receive your results only by: [employer] (if you have MyChart)  If you have any lab test that is abnormal or we need to change your treatment, we will call you or send a MyChart message to review the results.  Testing/Procedures: None ordered.  Follow-Up: At [facility], you and your health needs are our priority.  As part of our continuing mission to provide you with exceptional heart care, we have created designated Provider Care Teams.  These Care Teams include your primary Cardiologist (physician) and Advanced Practice Providers (APPs -  Physician Assistants and Nurse Practitioners) who all work together to provide you with the care you need, when you need it.  We recommend signing up for the patient portal called MyChart.  Sign up information is provided on this After Visit Summary.  MyChart is used to connect with patients for Virtual Visits (Telemedicine).  Patients are able to view lab/test results, encounter notes, upcoming appointments, etc.  Non-urgent messages can be sent to your provider as well.   To learn more about what you can do with MyChart, go to [email or web address].    Your next appointment:   6 months  The format for your next appointment:   In Person  Provider:   Ozell Fell, MD         Signed, Richard Fell, MD  09/28/2024 1:29 PM    [facility]

## 2024-09-28 NOTE — Telephone Encounter (Signed)
 Patient stated he will be in the area earlier today and wants to know if Dr. Wonda can see him earlier than his 11:40 am appointment.

## 2024-11-01 ENCOUNTER — Encounter: Admitting: *Deleted

## 2024-11-01 VITALS — BP 126/74 | HR 89 | Temp 97.9°F | Resp 16 | Wt 245.0 lb

## 2024-11-01 DIAGNOSIS — Z006 Encounter for examination for normal comparison and control in clinical research program: Secondary | ICD-10-CM

## 2024-11-01 MED ORDER — STUDY - ORION 4 - INCLISIRAN 300 MG/1.5 ML OR PLACEBO SQ INJECTION (PI-STUCKEY)
300.0000 mg | INJECTION | SUBCUTANEOUS | Status: AC
  Administered 2024-11-01: 300 mg via SUBCUTANEOUS
  Filled 2024-11-01: qty 1.5

## 2024-11-01 NOTE — Research (Signed)
 Orion 4 Patient doing well  No chest pains or shortness of breath  No med changes per patient  IP given  Will see patient back in June 2026   Current Outpatient Medications:    cetirizine-pseudoephedrine (ZYRTEC-D) 5-120 MG tablet, Take 1 tablet by mouth 2 (two) times daily., Disp: , Rfl:    Cholecalciferol  25 MCG (1000 UT) capsule, Take by mouth., Disp: , Rfl:    cyanocobalamin  (VITAMIN B12) 500 MCG tablet, Take by mouth., Disp: , Rfl:    empagliflozin  (JARDIANCE ) 25 MG TABS tablet, Take 25 mg by mouth daily., Disp: , Rfl:    gabapentin  (NEURONTIN ) 100 MG capsule, Take 200 mg by mouth 2 (two) times daily as needed., Disp: , Rfl:    glucose blood (CHOICE DM FORA G20 TEST STRIPS) test strip, Use as instructed, Disp: 100 each, Rfl: 12   glucose monitoring kit (FREESTYLE) monitoring kit, 1 each by Does not apply route as needed for other., Disp: 1 each, Rfl: 0   Lancets (ONETOUCH ULTRASOFT) lancets, Use as instructed, Disp: 100 each, Rfl: 12   latanoprost  (XALATAN ) 0.005 % ophthalmic solution, Place 1 drop into both eyes at bedtime., Disp: , Rfl:    losartan  (COZAAR ) 50 MG tablet, daily., Disp: , Rfl:    metFORMIN  (GLUCOPHAGE ) 500 MG tablet, Take 500 mg by mouth 2 (two) times daily with a meal. , Disp: , Rfl:    metoprolol  succinate (TOPROL -XL) 50 MG 24 hr tablet, Take 1 tablet (50 mg total) by mouth daily. Take with or immediately following a meal., Disp: 90 tablet, Rfl: 1   montelukast  (SINGULAIR ) 10 MG tablet, Take 1 tablet (10 mg total) by mouth at bedtime., Disp: 90 tablet, Rfl: 3   Multiple Vitamins-Minerals (MULTIVITAMIN WITH MINERALS) tablet, Take 1 tablet by mouth daily., Disp: , Rfl:    pantoprazole  (PROTONIX ) 40 MG tablet, Take 1 tablet (40 mg total) by mouth daily. Take 30-60 min before first meal of the day (Patient taking differently: Take 40 mg by mouth daily. Taking at bedtime), Disp: 90 tablet, Rfl: 3   pioglitazone  (ACTOS ) 30 MG tablet, Take 30 mg by mouth daily., Disp: , Rfl:     RYBELSUS 7 MG TABS, Take 1 tablet by mouth daily., Disp: , Rfl:    sacubitril -valsartan  (ENTRESTO ) 49-51 MG, Take 1 tablet by mouth 2 (two) times daily., Disp: 60 tablet, Rfl: 11   Study - ORION 4 - inclisiran 300 mg/1.5mL or placebo SQ injection (PI-Stuckey), Inject 1.5 mLs (300 mg total) into the skin every 6 (six) months., Disp: 1 mL, Rfl: 1   Travoprost, BAK Free, (TRAVATAN) 0.004 % SOLN ophthalmic solution, travoprost 0.004 % eye drops  INSTILL ONE DROP IN EACH EYE AT BEDTIME, Disp: , Rfl:    triamcinolone  cream (KENALOG ) 0.1 %, Apply 1 application topically 2 (two) times daily as needed (for irritation). , Disp: , Rfl: 0  Current Facility-Administered Medications:    Study - ORION 4 - inclisiran 300 mg/1.5mL or placebo SQ injection (PI-Stuckey), 300 mg, Subcutaneous, Q6 months, , 300 mg at 05/03/24 9041   Study - ORION 4 - inclisiran 300 mg/1.5mL or placebo SQ injection (PI-Stuckey), 300 mg, Subcutaneous, Q6 months, , 300 mg at 11/01/24 1054

## 2024-12-23 ENCOUNTER — Other Ambulatory Visit: Payer: Self-pay | Admitting: Cardiovascular Disease

## 2025-05-03 ENCOUNTER — Encounter
